# Patient Record
Sex: Female | Born: 1985
Health system: Southern US, Community
[De-identification: ages and names within clinical notes are randomized; demographics above are authoritative.]

## PROBLEM LIST (undated history)

## (undated) DIAGNOSIS — Q600 Renal agenesis, unilateral: Secondary | ICD-10-CM

## (undated) DIAGNOSIS — A6000 Herpesviral infection of urogenital system, unspecified: Secondary | ICD-10-CM

## (undated) DIAGNOSIS — IMO0002 Reserved for concepts with insufficient information to code with codable children: Secondary | ICD-10-CM

## (undated) HISTORY — PX: ECTOPIC PREGNANCY SURGERY: SHX613

## (undated) HISTORY — PX: DILATION AND EVACUATION: SHX1459

---
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
## 2006-05-01 ENCOUNTER — Inpatient Hospital Stay (HOSPITAL_COMMUNITY): Admission: EM | Admit: 2006-05-01 | Discharge: 2006-05-14 | Payer: Self-pay | Admitting: Emergency Medicine

## 2006-05-01 ENCOUNTER — Ambulatory Visit: Payer: Self-pay | Admitting: Cardiology

## 2006-05-01 ENCOUNTER — Ambulatory Visit: Payer: Self-pay | Admitting: Internal Medicine

## 2006-05-01 ENCOUNTER — Ambulatory Visit: Payer: Self-pay | Admitting: Pulmonary Disease

## 2006-05-02 DIAGNOSIS — Z862 Personal history of diseases of the blood and blood-forming organs and certain disorders involving the immune mechanism: Secondary | ICD-10-CM | POA: Insufficient documentation

## 2006-05-07 ENCOUNTER — Encounter: Payer: Self-pay | Admitting: Cardiology

## 2006-05-08 ENCOUNTER — Ambulatory Visit: Payer: Self-pay | Admitting: Infectious Diseases

## 2006-05-09 ENCOUNTER — Encounter: Payer: Self-pay | Admitting: Vascular Surgery

## 2006-05-09 ENCOUNTER — Ambulatory Visit: Payer: Self-pay | Admitting: Vascular Surgery

## 2006-05-15 DIAGNOSIS — N12 Tubulo-interstitial nephritis, not specified as acute or chronic: Secondary | ICD-10-CM | POA: Insufficient documentation

## 2006-05-15 DIAGNOSIS — A4151 Sepsis due to Escherichia coli [E. coli]: Secondary | ICD-10-CM | POA: Insufficient documentation

## 2006-05-27 ENCOUNTER — Encounter (INDEPENDENT_AMBULATORY_CARE_PROVIDER_SITE_OTHER): Payer: Self-pay | Admitting: *Deleted

## 2006-05-28 ENCOUNTER — Ambulatory Visit: Payer: Self-pay | Admitting: Internal Medicine

## 2006-05-28 ENCOUNTER — Encounter (INDEPENDENT_AMBULATORY_CARE_PROVIDER_SITE_OTHER): Payer: Self-pay | Admitting: *Deleted

## 2006-05-28 DIAGNOSIS — Q602 Renal agenesis, unspecified: Secondary | ICD-10-CM | POA: Insufficient documentation

## 2006-05-28 DIAGNOSIS — Q605 Renal hypoplasia, unspecified: Secondary | ICD-10-CM

## 2006-05-28 LAB — CONVERTED CEMR LAB
Eosinophils Relative: 3 % (ref 0–5)
Hemoglobin: 10.8 g/dL — ABNORMAL LOW (ref 12.0–15.0)
MCV: 92.8 fL (ref 78.0–100.0)
Neutro Abs: 2.7 10*3/uL (ref 1.7–7.7)
Platelets: 456 10*3/uL — ABNORMAL HIGH (ref 150–400)
RBC: 3.6 M/uL — ABNORMAL LOW (ref 3.87–5.11)
RDW: 14.5 % — ABNORMAL HIGH (ref 11.5–14.0)
WBC: 6.1 10*3/uL (ref 4.0–10.5)

## 2006-06-02 ENCOUNTER — Encounter (INDEPENDENT_AMBULATORY_CARE_PROVIDER_SITE_OTHER): Payer: Self-pay | Admitting: *Deleted

## 2006-10-31 ENCOUNTER — Encounter: Payer: Self-pay | Admitting: Internal Medicine

## 2007-01-15 ENCOUNTER — Other Ambulatory Visit: Admission: RE | Admit: 2007-01-15 | Discharge: 2007-01-15 | Payer: Self-pay | Admitting: Obstetrics and Gynecology

## 2007-07-14 ENCOUNTER — Inpatient Hospital Stay (HOSPITAL_COMMUNITY): Admission: EM | Admit: 2007-07-14 | Discharge: 2007-07-16 | Payer: Self-pay | Admitting: Emergency Medicine

## 2007-08-03 ENCOUNTER — Ambulatory Visit: Payer: Self-pay | Admitting: Internal Medicine

## 2007-08-04 ENCOUNTER — Encounter (INDEPENDENT_AMBULATORY_CARE_PROVIDER_SITE_OTHER): Payer: Self-pay | Admitting: Family Medicine

## 2008-02-26 ENCOUNTER — Inpatient Hospital Stay (HOSPITAL_COMMUNITY): Admission: AD | Admit: 2008-02-26 | Discharge: 2008-02-26 | Payer: Self-pay | Admitting: Obstetrics & Gynecology

## 2008-04-28 ENCOUNTER — Inpatient Hospital Stay (HOSPITAL_COMMUNITY): Admission: AD | Admit: 2008-04-28 | Discharge: 2008-04-28 | Payer: Self-pay | Admitting: Obstetrics & Gynecology

## 2008-06-23 ENCOUNTER — Ambulatory Visit (HOSPITAL_COMMUNITY): Admission: RE | Admit: 2008-06-23 | Discharge: 2008-06-23 | Payer: Self-pay | Admitting: Family Medicine

## 2008-08-21 ENCOUNTER — Encounter (HOSPITAL_COMMUNITY): Payer: Self-pay | Admitting: Obstetrics and Gynecology

## 2008-08-21 ENCOUNTER — Ambulatory Visit: Payer: Self-pay | Admitting: Obstetrics and Gynecology

## 2008-08-21 ENCOUNTER — Inpatient Hospital Stay (HOSPITAL_COMMUNITY): Admission: AD | Admit: 2008-08-21 | Discharge: 2008-08-21 | Payer: Self-pay | Admitting: Obstetrics & Gynecology

## 2008-08-21 ENCOUNTER — Inpatient Hospital Stay (HOSPITAL_COMMUNITY): Admission: AD | Admit: 2008-08-21 | Discharge: 2008-08-23 | Payer: Self-pay | Admitting: Obstetrics & Gynecology

## 2008-10-11 IMAGING — CR DG CHEST 1V PORT
1 series · 1 of 1 positions shown · non-contrast
Comparison: 05/02/06.

CLINICAL DATA: Renal failure and pulmonary edema. 
 PORTABLE CHEST ? 1 VIEW, 05/04/06, 4289 HOURS:

[view not recorded]
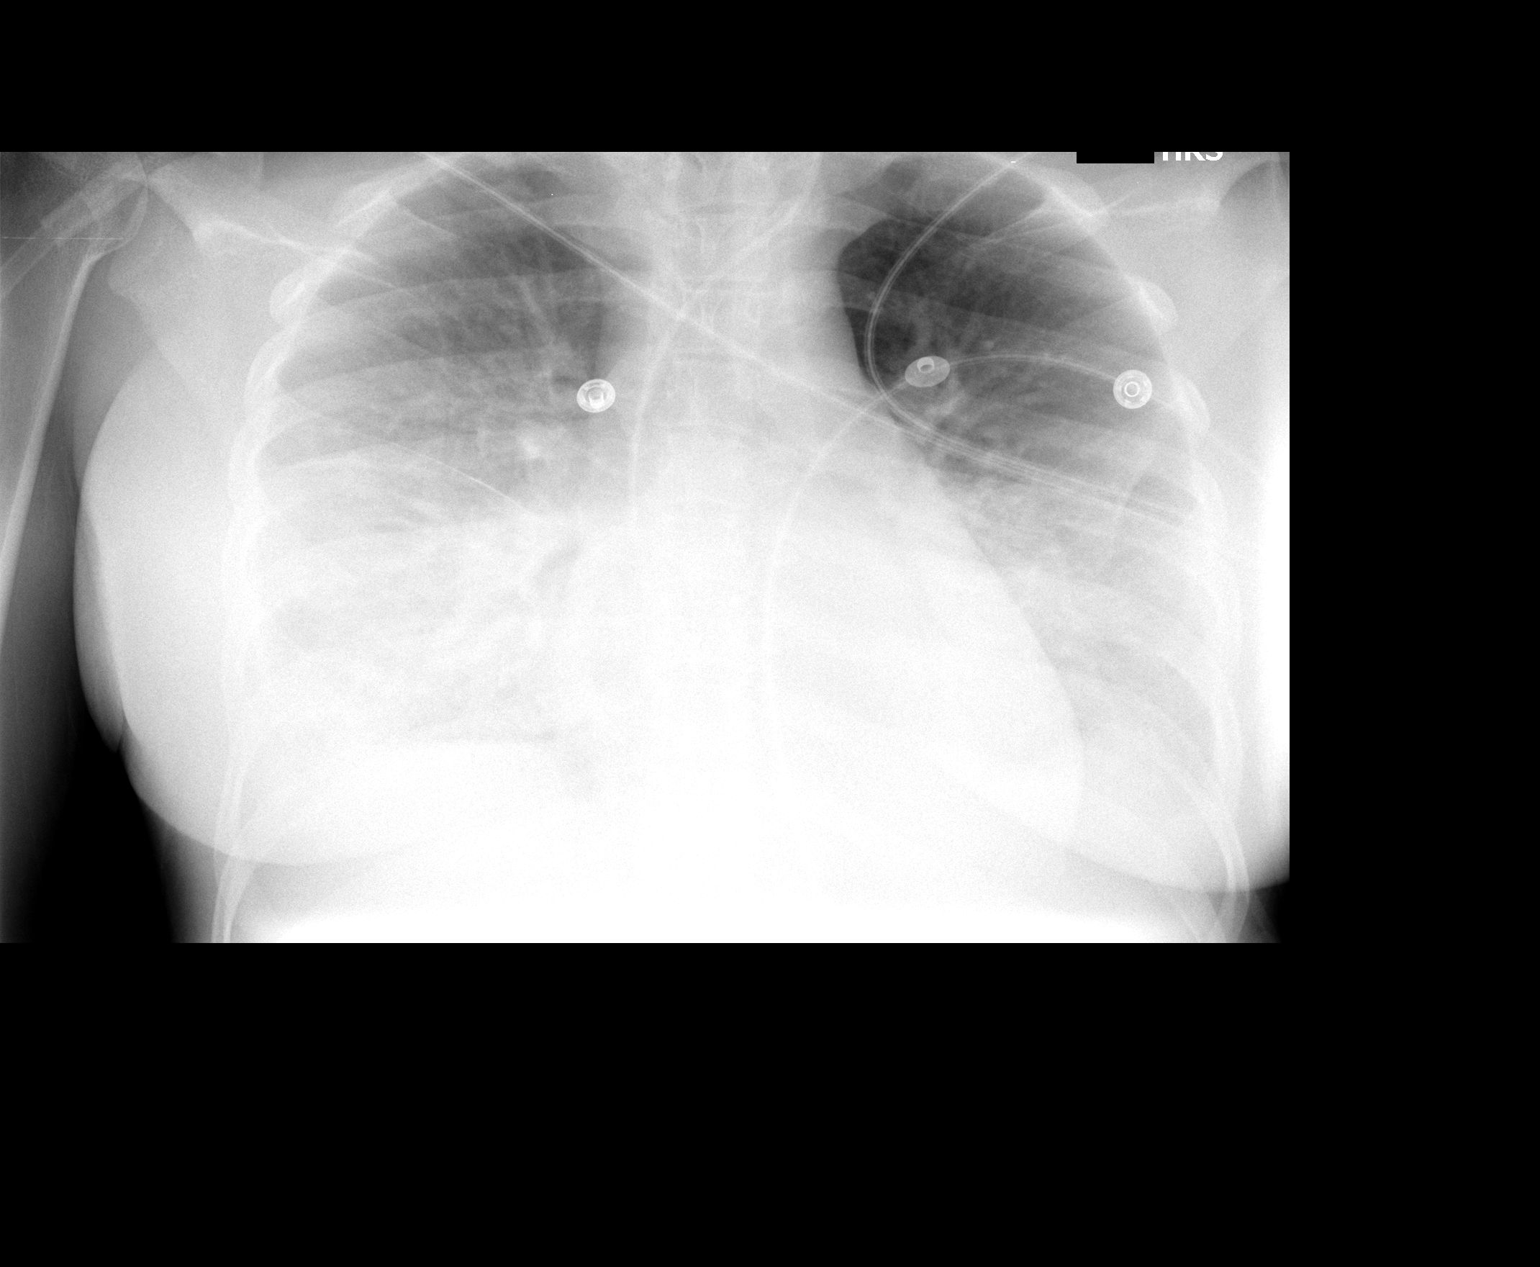

[1 of 1 positions shown; findings below may reference images not displayed]

FINDINGS: Bilateral lung volumes are lower when compared to the prior study.  Relatively stable edema pattern otherwise, which is slightly more prominent on the right.  Stable heart size.
IMPRESSION: Lower lung volumes.  Overall stable edema.

## 2008-10-12 IMAGING — US US RENAL PORT
1 series · 14 of 20 positions shown · non-contrast
Comparison: 05/02/06.

CLINICAL DATA: Renal failure.  
PORTABLE RENAL/URINARY TRACT ULTRASOUND:
TECHNIQUE: Complete ultrasound examination of the urinary tract was performed including evaluation of the kidneys, renal collecting systems, and urinary bladder.

[Series 1: unknown · 0.33mm/px · 14 of 20 slices shown]
[im 1/20]
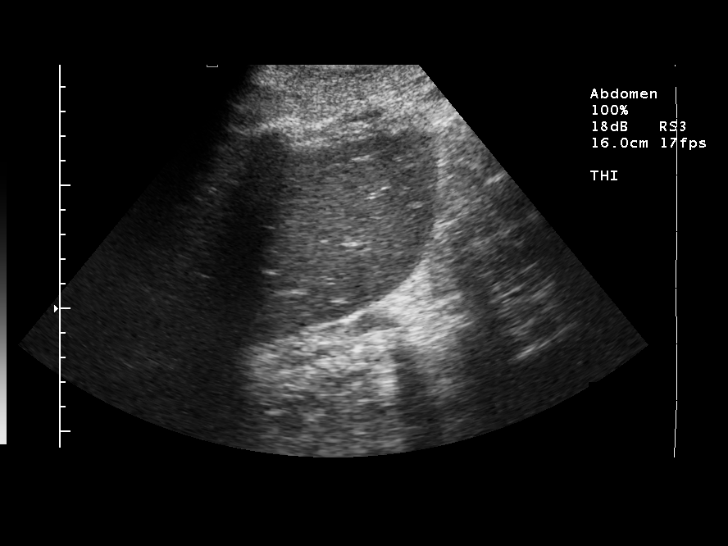
[im 3/20]
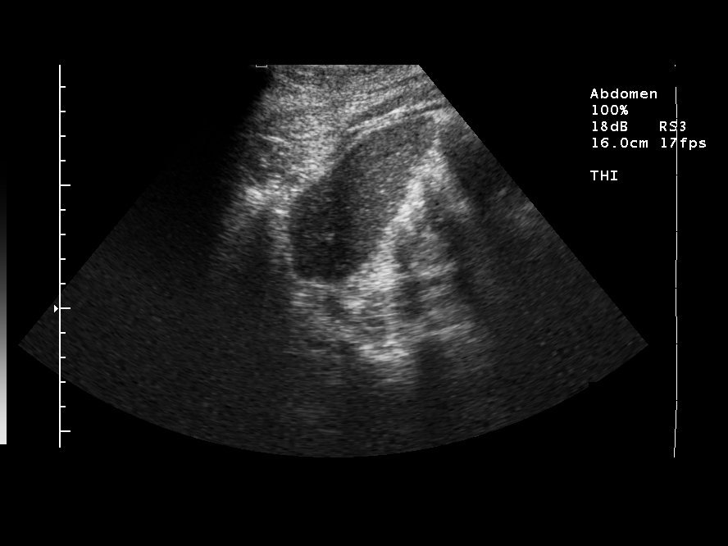
[im 4/20]
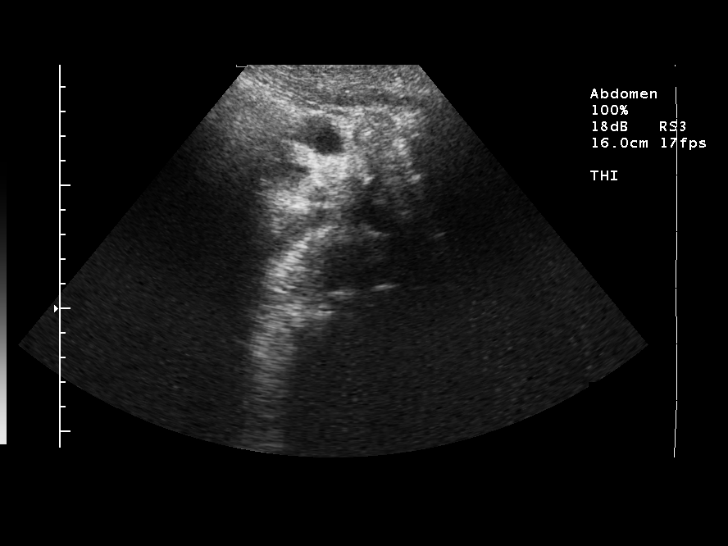
[im 6/20]
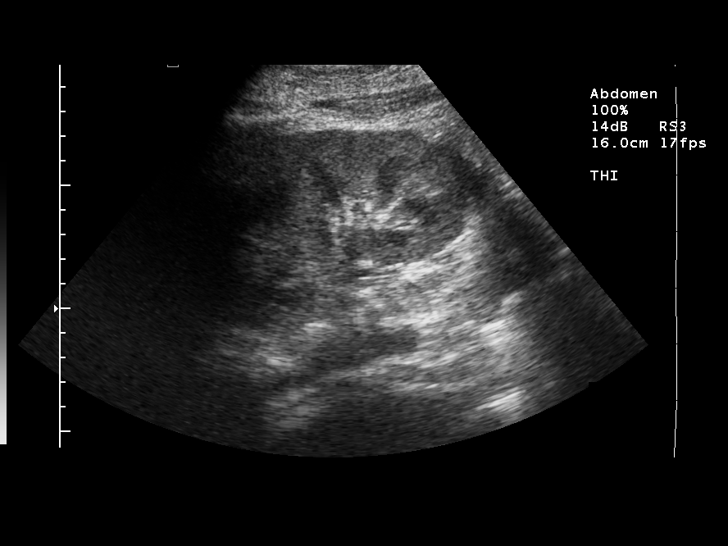
[im 7/20]
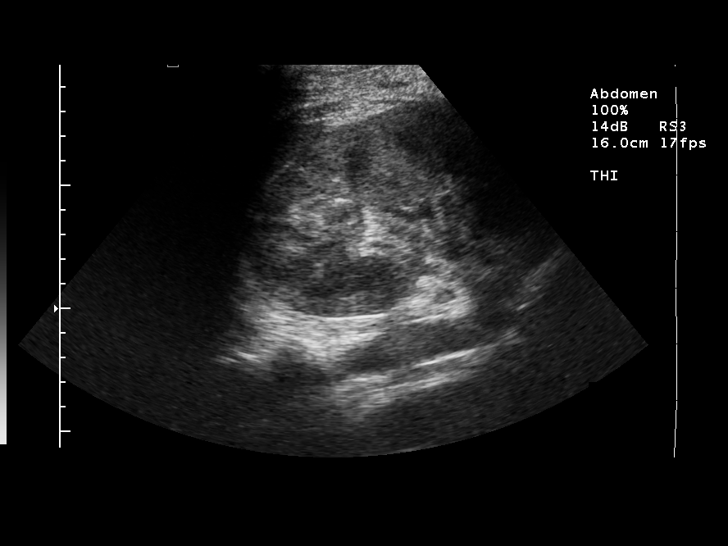
[im 8/20]
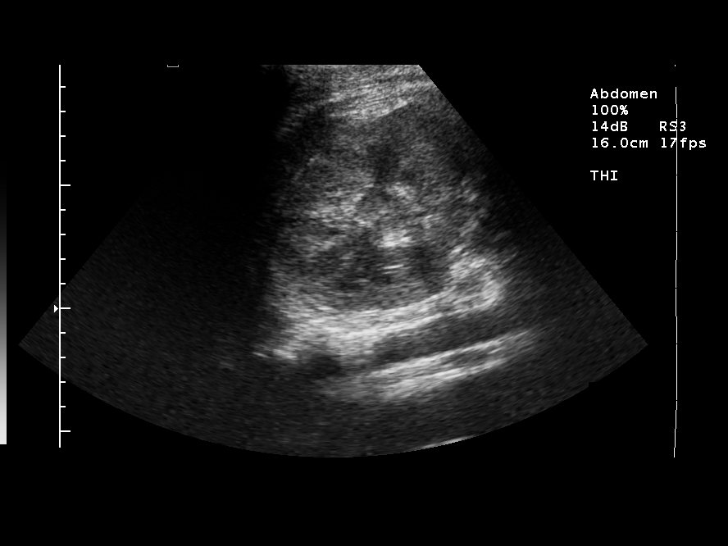
[im 10/20]
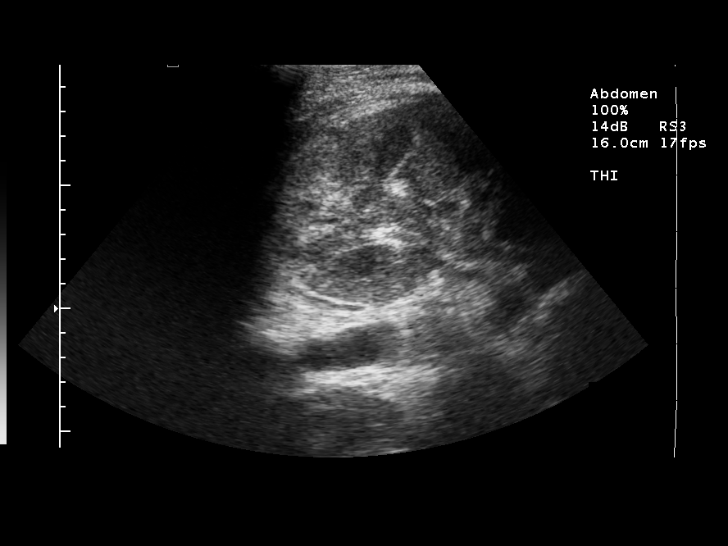
[im 11/20]
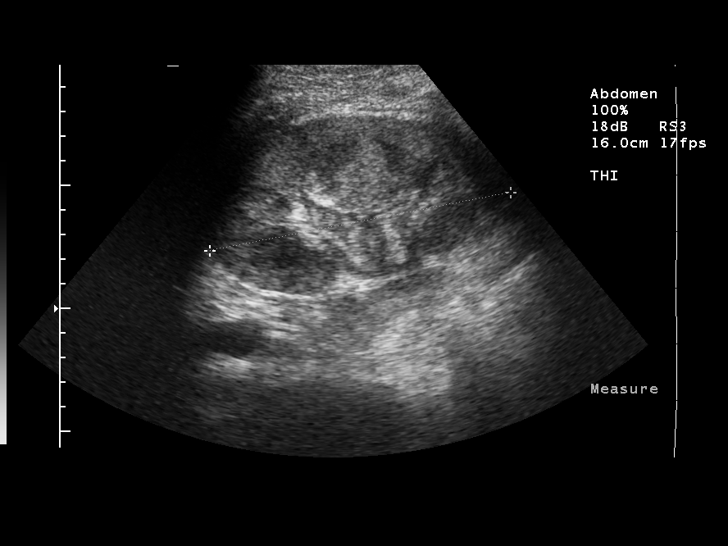
[im 13/20]
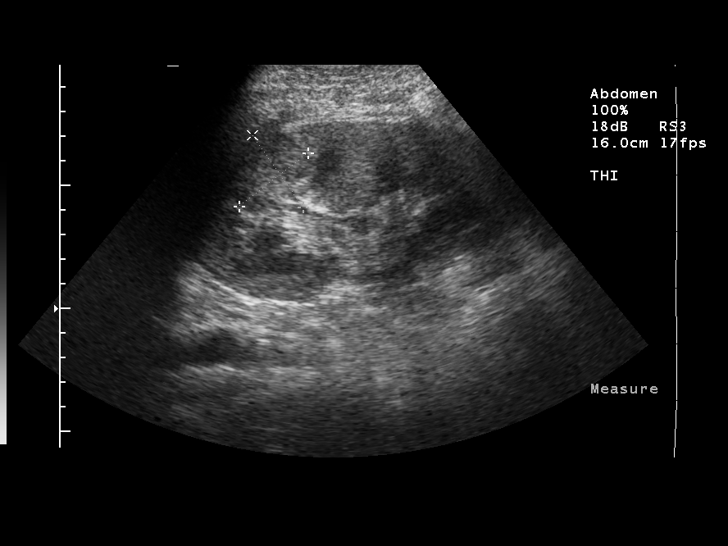
[im 14/20]
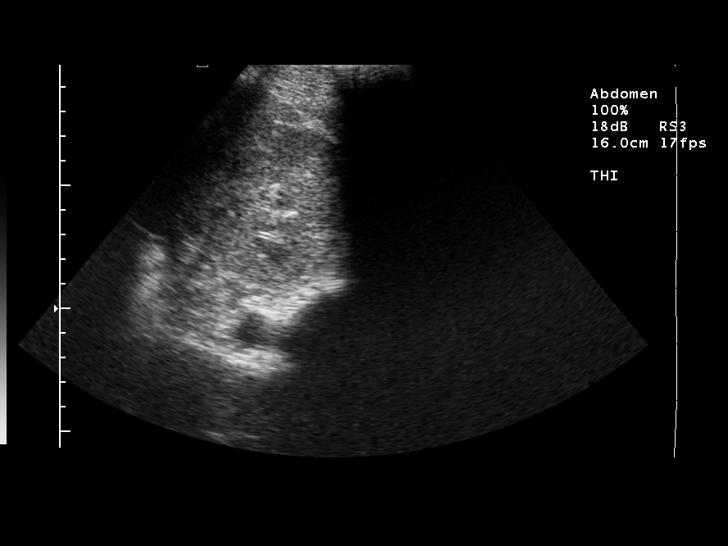
[im 16/20]
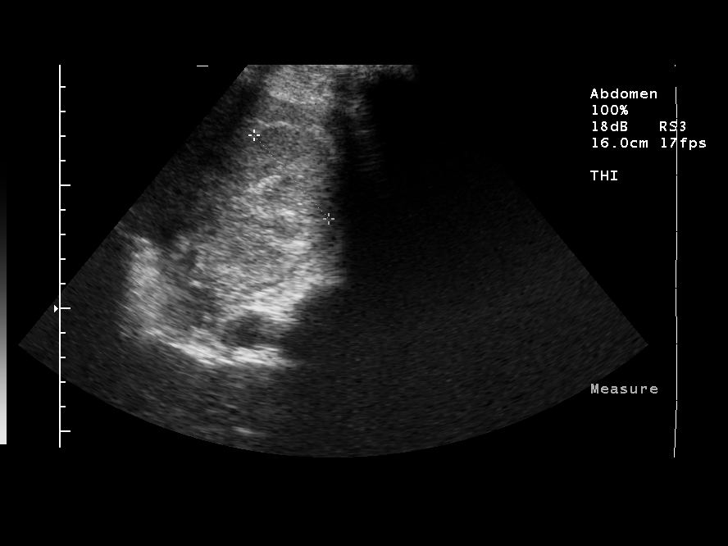
[im 17/20]
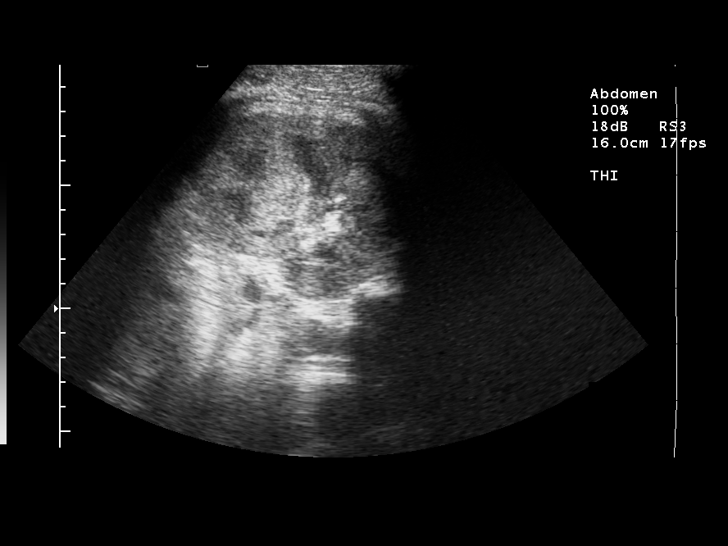
[im 18/20]
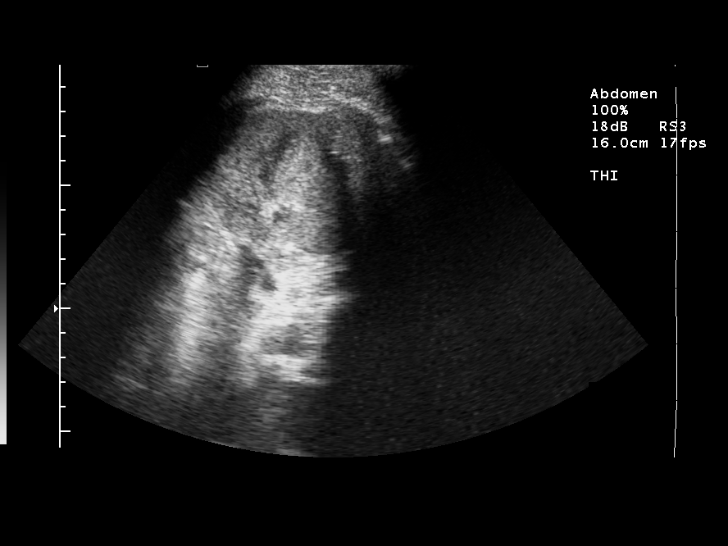
[im 20/20]
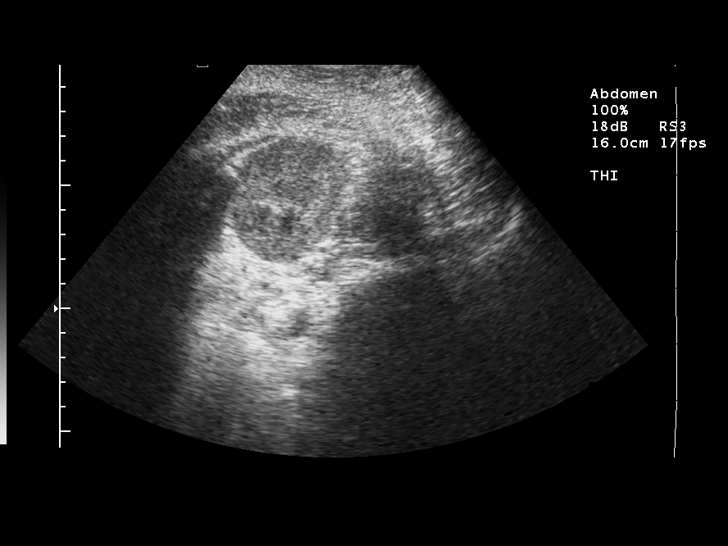

[14 of 20 positions shown; findings below may reference images not displayed]

FINDINGS: The patient has a solitary right kidney.  The left kidney measures 14.3 cm in length. The measurement on the prior exam was 14.5 cm.  No hydronephrosis.  Diffuse increase in renal cortical echogenicity with the medullary pyramids standing out as relatively sonolucent structures.  No hydronephrosis.
IMPRESSION: Findings compatible with a renal cortical based disease involving the solitary right kidney, for example diabetic nephropathy or a glomerulonephritis.  No hydronephrosis.

## 2008-10-12 IMAGING — CR DG CHEST 1V PORT
1 series · 1 of 1 positions shown · non-contrast
Comparison: 05/04/06 at [DATE] a.m.

CLINICAL DATA: Followup renal insufficiency/pulmonary edema. 
 PORTABLE CHEST ([DATE] A.M.):

[view not recorded]
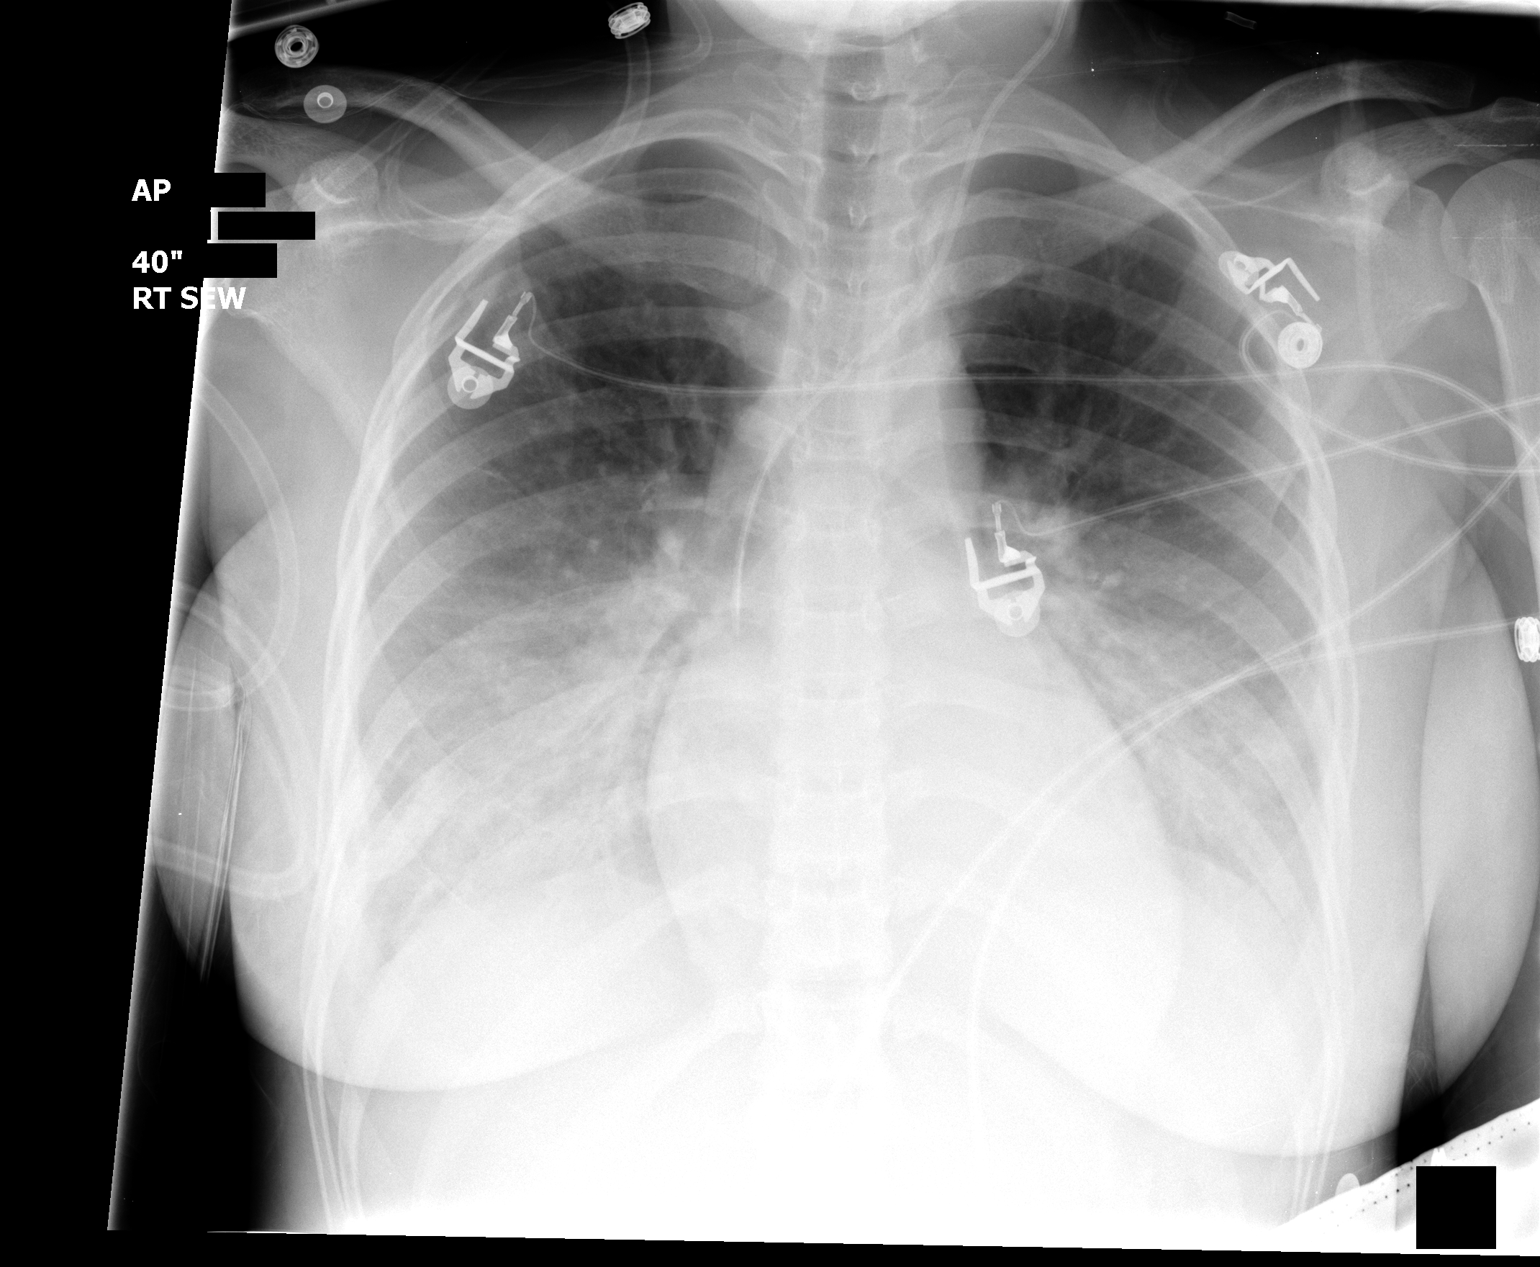

[1 of 1 positions shown; findings below may reference images not displayed]

FINDINGS: Left central line tip distal superior vena cava.  No pneumothorax.  Pulmonary edema.  Basilar infiltrate superimposed upon such cannot be excluded in the appropriate clinical setting.  Heart size top normal.  Small pleural effusions may be present.
IMPRESSION: No significant change in what may represent pulmonary edema although basilar infiltrates cannot be excluded in the appropriate clinical setting.

## 2009-10-25 ENCOUNTER — Inpatient Hospital Stay (HOSPITAL_COMMUNITY): Admission: AD | Admit: 2009-10-25 | Discharge: 2009-10-25 | Payer: Self-pay | Admitting: Obstetrics & Gynecology

## 2010-05-11 LAB — GC/CHLAMYDIA PROBE AMP, GENITAL: GC Probe Amp, Genital: NEGATIVE

## 2010-05-11 LAB — URINALYSIS, ROUTINE W REFLEX MICROSCOPIC
Hgb urine dipstick: NEGATIVE
Nitrite: NEGATIVE
Protein, ur: NEGATIVE mg/dL
Specific Gravity, Urine: 1.025 (ref 1.005–1.030)
Urobilinogen, UA: 1 mg/dL (ref 0.0–1.0)
pH: 6 (ref 5.0–8.0)

## 2010-05-11 LAB — WET PREP, GENITAL
Clue Cells Wet Prep HPF POC: NONE SEEN
Trich, Wet Prep: NONE SEEN
Yeast Wet Prep HPF POC: NONE SEEN

## 2010-05-11 LAB — POCT PREGNANCY, URINE: Preg Test, Ur: NEGATIVE

## 2010-06-04 LAB — CBC
HCT: 29.8 % — ABNORMAL LOW (ref 36.0–46.0)
MCHC: 34.3 g/dL (ref 30.0–36.0)
MCHC: 34.3 g/dL (ref 30.0–36.0)
MCHC: 34.5 g/dL (ref 30.0–36.0)
MCV: 95.4 fL (ref 78.0–100.0)
MCV: 95.4 fL (ref 78.0–100.0)
MCV: 96.5 fL (ref 78.0–100.0)
Platelets: 203 10*3/uL (ref 150–400)
Platelets: 251 10*3/uL (ref 150–400)
RBC: 3.13 MIL/uL — ABNORMAL LOW (ref 3.87–5.11)
RBC: 3.48 MIL/uL — ABNORMAL LOW (ref 3.87–5.11)
RDW: 13.9 % (ref 11.5–15.5)
WBC: 12.9 10*3/uL — ABNORMAL HIGH (ref 4.0–10.5)
WBC: 16.2 10*3/uL — ABNORMAL HIGH (ref 4.0–10.5)

## 2010-06-04 LAB — DIFFERENTIAL
Basophils Relative: 0 % (ref 0–1)
Eosinophils Absolute: 0 10*3/uL (ref 0.0–0.7)
Eosinophils Relative: 0 % (ref 0–5)
Lymphs Abs: 1 10*3/uL (ref 0.7–4.0)
Monocytes Absolute: 1.1 10*3/uL — ABNORMAL HIGH (ref 0.1–1.0)
Monocytes Relative: 7 % (ref 3–12)

## 2010-06-04 LAB — RPR: RPR Ser Ql: NONREACTIVE

## 2010-06-04 LAB — RAPID URINE DRUG SCREEN, HOSP PERFORMED
Barbiturates: NOT DETECTED
Benzodiazepines: NOT DETECTED

## 2010-06-07 LAB — URINALYSIS, ROUTINE W REFLEX MICROSCOPIC
Glucose, UA: NEGATIVE mg/dL
Ketones, ur: NEGATIVE mg/dL
Protein, ur: NEGATIVE mg/dL
Urobilinogen, UA: 0.2 mg/dL (ref 0.0–1.0)

## 2010-06-07 LAB — WET PREP, GENITAL

## 2010-06-11 LAB — URINALYSIS, ROUTINE W REFLEX MICROSCOPIC
Bilirubin Urine: NEGATIVE
Glucose, UA: NEGATIVE mg/dL
Nitrite: NEGATIVE
Specific Gravity, Urine: 1.015 (ref 1.005–1.030)
pH: 6.5 (ref 5.0–8.0)

## 2010-06-11 LAB — WET PREP, GENITAL
Trich, Wet Prep: NONE SEEN
Yeast Wet Prep HPF POC: NONE SEEN

## 2010-06-11 LAB — GC/CHLAMYDIA PROBE AMP, GENITAL: Chlamydia, DNA Probe: NEGATIVE

## 2010-07-10 NOTE — Discharge Summary (Signed)
NAMEENVI, EAGLESON         ACCOUNT NO.:  [id number]   MEDICAL RECORD NO.:  [id number]          PATIENT TYPE:  INP   LOCATION:  5151                         FACILITY:  [facility]   PHYSICIAN:  Marcellus Scott, MD     DATE OF BIRTH:  [birth year]   DATE OF ADMISSION:  07/14/2007  DATE OF DISCHARGE:  07/16/2007                               DISCHARGE SUMMARY   PRIMARY MEDICAL DOCTOR:  Gentry Fitz but will follow up at [facility].   DISCHARGE DIAGNOSES:  1. Escherichia coli acute left pyelonephritis.  2. Congenital solitary left kidney.  3. Leukocytosis - resolved.  4. Abuse - tobacco and substance.  5. Proteinuria.   DISCHARGE MEDICATIONS:  1. Cipro 500 mg p.o. b.i.d. for 5 days.  2. Tylenol 650 mg p.o. q. 4-6 hours p.r.n.   PROCEDURES:  Renal ultrasound.  A solitary left kidney, compensated  hypertrophy of the left kidney, otherwise normal left kidney.  No  bladder lesions.   PERTINENT LABS:  Urine culture E. coli sensitivity pending.  Basic  metabolic panel today is unremarkable with a BUN of 5, creatinine of  0.87.  CBC with hemoglobin 12.1, hematocrit 35.9, white blood cell 9,  platelets 217.  Urinalysis with too-numerous-to-count white blood cells  and many bacteria, 100 mg/dL of protein.  Urine pregnancy test was  negative.   CONSULTATIONS:  None.   HOSPITAL COURSE AND PATIENT DISPOSITION:  Please refer to the history  and physical note for initial admission details.  In summary, Ms.  Murakami a pleasant 25 year old [place] American female patient with a  history of solitary kidney, prior episode of pyelonephritis in that  kidney who presented with dysuria and left flank pain of approximately  10 days' duration.  She also gave a history of chills but no measurable  fever.  She is sexually active with multiple partners and uses  protection most times but not always.  Further evaluation in the  emergency room revealed a temperature of 98.5 degrees Fahrenheit,  leukocytosis of 13.4,  left costovertebral angle tenderness, and a  urinalysis suggestive of pyelonephritis.  The patient was thereby  admitted to the medical floor for management.  1. Escherichia coli acute left pyelonephritis.  Urine cultures were      sent and she was placed empirically on IV Cipro. The patient has      done quite well, with resolution of left flank pain within the      first 24 hours.  She has been afebrile.  Her leukocytosis has      resolved.  Abdomen is benign and there is no costovertebral angle      tenderness.  She has completed 48 hours of IV antibiotics.  Her      sensitivity is still pending.  Will discharge the patient on oral      ciprofloxacin to complete a total 7-day course of antibiotics.  The      patient and her mother have been advised to contact me tomorrow for      the results of the sensitivity pattern, if the antibiotics require      changing.  The patient has also  been advised to seek immediate      medical attention if there is any further deterioration in her      condition.  She is to follow up with [facility] and has an      appointment made for her on Jul 22, 2007 at 9:30 a.m.  2. A solitary left kidney.  A renal ultrasound was done to ensure      there were no complications and it does appear to be an      uncomplicated pyelonephritis.  3. Leukocytosis secondary to problem #1 - resolved.  4. Abuse of tobacco and marijuana.  The patient has been counseled.   Addendum: Urine culture: pan sensitive E Coli.      Marcellus Scott, MD  Electronically Signed     AH/MEDQ  D:  07/16/2007  T:  07/16/2007  Job:  [id number]   cc:   Dala Dock

## 2010-07-10 NOTE — H&P (Signed)
Connie Wells, KLINGEL NO.:  [id number]   MEDICAL RECORD NO.:  [id number]          PATIENT TYPE:  INP   LOCATION:  1827                         FACILITY:  [facility]   PHYSICIAN:  Altha Harm, MDDATE OF BIRTH:  [birth year]   DATE OF ADMISSION:  07/14/2007  DATE OF DISCHARGE:                              HISTORY & PHYSICAL   This is a 25 year old female with history of a solitary kidney who  presents to the emergency room with symptoms of dysuria and left flank  pain.  The patient states that dysuria started approximately 1-1/2 weeks  ago.  However, today she was having pain in the left flank area and  unable to stand.  The patient has been having chills at home but no  measurable fever.  Here in the emergency room, there is no fever  measured.  The patient has been taking Tylenol and Motrin to abate her  pain which has helped up until today.  The patient has had no sick  contacts.  She is sexually active with multiple partners.  She denies  any vaginal discharge or dyspareunia.  Please note that the patient was  hospitalized approximately 1 year ago with E. coli sepsis requiring ICU  admission.   PAST MEDICAL HISTORY:  Significant only for the solitary kidney and the  E. coli sepsis approximately a year ago.   FAMILY HISTORY:  Unremarkable.   SOCIAL HISTORY:  The patient lives at home with her family including her  mother and siblings.  She smokes approximately 1-2 cigarettes per day.  She uses alcohol occasionally, and she uses marijuana recreationally.  She is sexually active with mainly one partner but occasionally has  other partners. She has had unprotected sex on occasion; however, her  usual practice is to use condoms.   ALLERGIES:  No known drug allergies.   CURRENT MEDICATIONS:  None.   PAST MEDICAL HISTORY:  None.   REVIEW OF SYSTEMS:  Fourteen systems were reviewed.  All systems were  negative except as noted in the HPI.   In the emergency  room, studies showed the following.  White blood cell  count 13.4, hemoglobin 14.6, hematocrit 43, platelet count 267.  Sodium  140, potassium 4.3, chloride 104, bicarb 96, BUN 8, creatinine 1.2.  Urinalysis positive for elements consistent with urinary tract  infection.   PHYSICAL EXAMINATION:  GENERAL:  The patient is resting comfortably in  no acute distress.  VITAL SIGNS:  Temperature 98.5, blood pressure 116/78, heart rate 81,  respiratory rate 18.  HEENT:  Normocephalic and atraumatic.  Pupils equal, round, and reactive  to light and accommodations.  Extraocular movements intact.  Tympanic  membranes translucent bilaterally with good landmarks.  Oropharynx is  moist.  No exudates, erythema, or lesions noted.  RESPIRATORY:  Normal respiratory effort. Equal excursion bilaterally.  No wheeze, rales, rhonchi noted on examination.  CARDIOVASCULAR:  Normal S1 and S2.  No murmurs, rubs, or gallops noted.  PMI nondisplaced.  ABDOMEN:  Normoactive bowel sounds, soft, nontender, nondistended.  No  masses or hepatosplenomegaly.  The patient has CVA tenderness.  NEUROLOGIC:  No focal neurologic deficits.  Cranial nerves II-XII  grossly intact.  MUSCULOSKELETAL:  No warmth, swelling, or erythema around the joints.  No spinal tenderness noted.  PSYCHIATRIC:  Alert and oriented x3.  She has good insight and  cognition, recent and remote recall.   ASSESSMENT AND PLAN:  The patient presents with pyelonephritis.  Although the patient is not toxic appearing, she does have findings of  costovertebral angle tenderness.  In light of a solitary kidney, the  patient will be admitted for at least 24 hours of IV antibiotics and  then switched to oral.  Urine will be sent for culture and sensitivity,  and further therapy would depend on initial response to testing and  therapy.      Altha Harm, MD  Electronically Signed     MAM/MEDQ  D:  07/14/2007  T:  07/14/2007  Job:  [id number]

## 2010-07-13 NOTE — Consult Note (Signed)
NAMEANALA, WHISENANT         ACCOUNT NO.:  [id number]   MEDICAL RECORD NO.:  [id number]          PATIENT TYPE:  INP   LOCATION:  2316                         FACILITY:  [facility]   PHYSICIAN:  Lindaann Slough, M.D.  DATE OF BIRTH:  12-23-1985   DATE OF CONSULTATION:  DATE OF DISCHARGE:                                 CONSULTATION   DATE OF CONSULTATION:  May 02, 2006.   INDICATION FOR CONSULTATION:  1. Left flank pain.  2. Possible kidney stone.  3. Sepsis.   HISTORY OF PRESENT ILLNESS:  The patient is a 25 year old female who  started complaining of left back and left lower quadrant pain 2 days  ago.  The pain has been moderate in nature and constant and associated  with gross hematuria, nausea and vomiting and a fever of 103.  She then  went to the emergency room yesterday and a renal ultrasound showed a  solitary left kidney without hydronephrosis.  Creatinine was 2.51.  CT  scan showed a solitary left kidney with possible left distal urinary  wall stone with inflammatory changes along the left kidney and the left  ureter with mild fullness of the collecting system in the ureter.  There  is a questionable 3-mm left UVJ calculus.  Initially the patient did not  have any urinary output.  A Foley catheter was then inserted in the  bladder and then eventually she started passing urine.  The original  plan was to take her to the OR last night for insertion of a double-J  catheter but when she started passing urine it was felt not indicated to  take her to the OR on an emergent basis.  She was then admitted.  Her  temperature this morning was up to 101.9 and her blood pressure went  down to 81/40 and fluctuates to 97/61 with a pulse rate between 110 and  140 and respirations between 20 and 30.  At this time she is alert and  oriented and she is in no acute distress.  The Foley catheter is  draining blood tinge urine.   PAST MEDICAL HISTORY:  Positive for chlamydia, herpes.   PAST SURGICAL HISTORY:  None.   ALLERGIES:  No known drug allergies.   MEDICATIONS:  None.   FAMILY HISTORY:  Positive for hypertension and diabetes.   SOCIAL HISTORY:  She has  smoked for about a year and she drinks  occasionally.   REVIEW OF SYSTEMS:  Positive for nausea, vomiting, history of gross  hematuria, left flank and left lower quadrant pain and everything else  is negative.   PHYSICAL EXAMINATION:  GENERAL:  On physical examination, this is a well-  developed 25 year old female with is complaining of moderate left flank  and left lower quadrant pain.  VITAL SIGNS:  Blood pressure is 97/61.  Pulse 110.  Respirations 20.  HEENT:  Her head is normal.  She has pink conjunctivae.  She has a  central line on the left side of the neck and there is no cervical  adenopathy and no thyromegaly.  LUNGS:  Clear.  HEART:  Regular rhythm.  ABDOMEN:  Soft.  Nondistended.  She has mild left CVA tenderness and  mild tenderness in the left flank and the left lower quadrant.  She has  no hepatomegaly.  No splenomegaly.  Bladder is not distended.  She has a  Foley catheter that is draining blood tinged urine.   I did not do a pelvic examination.   LABORATORY DATA:  Hemoglobin this morning is 10.5.  hematocrit 30.  WBCs  16.9 with 97% neutrophils.  BUN is 17 this morning with creatinine down  to 2.1.  Urinalysis shows more than 300 mg of protein, 18 mg of ketone,  2 mg of urobilinogen, nitrite positive, many bacteria, too numerous to  count WBCs and RBCs.  Urine culture is pending.  Blood culture shows  gram-negative and gram-positive rods.   IMPRESSION:  1. Urosepsis.  2. Possible left ureteral stone.  3. Solitary left kidney.   SUGGESTION:  Continue IV antibiotics.  Repeat renal ultrasound, and if  there is evidence of hydronephrosis she would need either a double-J  catheter or percutaneous nephrostomy.   We will follow the patient with you.      Lindaann Slough, M.D.   Electronically Signed     MN/MEDQ  D:  05/02/2006  T:  05/02/2006  Job:  [id number]

## 2010-07-13 NOTE — Consult Note (Signed)
NAMESELEEN, Connie Wells         ACCOUNT NO.:  [id number]   MEDICAL RECORD NO.:  [id number]          PATIENT TYPE:  INP   LOCATION:  1829                         FACILITY:  [facility]   PHYSICIAN:  Maisie Fus A. Cornett, M.D.DATE OF BIRTH:  [birth year]   DATE OF CONSULTATION:  DATE OF DISCHARGE:                                 CONSULTATION   REQUESTING PHYSICIAN:  Dr. Josetta Huddle.   REASON FOR CONSULTATION:  Abdominal pain, hematuria.   HISTORY OF PRESENT ILLNESS:  The patient is a 25 year old female with a  2-day history of left back and left lower quadrant left suprapubic pain.  It has been constant in nature.  The pain right now is currently gone,  she states.  Denies any history of vaginal discharge but apparently has  a history of chlamydia in the past.  She was noted to have significant  hematuria grossly and also fever to 103.  Currently, she is denying any  abdominal pain.  Denies any dysuria or polyuria.  Her pain was mild to  moderate, mostly in her back and left flank.  She also has some  suprapubic left lower quadrant pain, but according to her is gone now.  It has been associated nausea and vomiting.  The pain was moderate  intensity with no radiation.  Unclear if anything made it better or  worse.   PAST MEDICAL HISTORY:  1. History of chlamydia.  2. History of herpes.   PAST SURGICAL HISTORY:  None.   ALLERGIES:  NONE.   MEDICATIONS:  None.   FAMILY HISTORY:  Hypertension and diabetes.   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  Her mother accompanies her.   REVIEW OF SYSTEMS:  A 15-point review of systems negative, otherwise as  stated as above.   PHYSICAL EXAM:  VITAL SIGNS:  Temperature 103, heart rate 120, blood  pressure 129/73.  GENERAL APPEARANCE:  Pleasant female in mild distress.  HEENT:  No scleral icterus.  Oropharynx is moist.  NECK:  Supple, nontender, no mass.  Trachea midline.  CHEST:  Clear to auscultation.  Chest wall motion normal.  CARDIOVASCULAR:  Tachycardia without rub, murmur or gallop.  EXTREMITIES:  Peripheral pulses are present.  Feet are warm.  ABDOMEN:  Soft, nondistended.  No rebound or guarding.  Specifically, no  suprapubic or left lower quadrant tenderness.  No mass.  GU:  I did not  repeat her pelvic exam.  Cyst was negative apparently by the EMDs  report.  EXTREMITIES:  Muscle tone normal range of motion of normal.  NEUROLOGIC:  Examination grossly intact with motor and sensory function  and cranial nerve function intact.   DIAGNOSTIC STUDIES:  Ultrasound shows a solitary left kidney.  No  evidence of hydronephrosis.  She is a white count of 9,000 with a left  shift.  Her  hemoglobin and platelets are normal.  Urinalysis shows  blood and too numerous to count white cells.  There is also evidence of  leukocyte esterase positivity as well.   IMPRESSION:  Flank pain, high fever and obvious signs of urinary tract  issue, with a elevated creatinine 2.5.  PLAN:  I have recommended CT scanning to further evaluate her abdominal  contents without IV contrast.  Recommend consultation to possible GYN,  as she has a  history of the abdominal pain.  She does have significant  findings of urological issues here.  Her examination does not support  any intra-abdominal process from the standpoint of general surgery.  We  will follow along with her this point in time but feels IV antibiotics  and fluid are necessary.  Thank you this consultation.      Thomas A. Cornett, M.D.  Electronically Signed     TAC/MEDQ  D:  05/01/2006  T:  05/01/2006  Job:  [id number]

## 2010-07-13 NOTE — Discharge Summary (Signed)
Connie Wells, Connie Wells         ACCOUNT NO.:  [id number]   MEDICAL RECORD NO.:  [id number]          PATIENT TYPE:  INP   LOCATION:  5022                         FACILITY:  [facility]   PHYSICIAN:  Thereasa Solo, M.D.  DATE OF BIRTH:  Jun 30, 1985   DATE OF ADMISSION:  05/01/2006  DATE OF DISCHARGE:  05/14/2006                         DISCHARGE SUMMARY - REFERRING   DISCHARGE DIAGNOSES:  1. Complicated acute episode of pyelonephritis which lead to E. coli      sepsis and septic shock requiring use of Xigris for systemic      inflammatory response syndrome.  2. Unilateral kidney.  One of the patient's kidneys is not present on      ultrasound on her right side, only her left one is present.  3. Question of nephrolithiasis during this admission.  4. Chlamydia diagnosis, history of in October 1997, as well as      Gonorrhea.  5. Herpes simplex virus II diagnosed in 2005.  6. Tobacco abuse.   DISCHARGE MEDICATIONS:  1. Cipro 500 mg b.i.d. x3 weeks.  2. Oxycodone 5 mg one pill q.6h. p.r.n. pain.  Dispensed 20.   DISPOSITION AND FOLLOW-UP:  Patient is to follow up in our outpatient  clinic who will call the patient with an appointment within one week.  At time of follow-up visit, please check if the patient has been able to  obtain her medications and she is not having any more symptoms of  pyelonephritis.  May wish to verify this by checking her CBC and white  count as well as consider checking a UA.  Both of these may still show  signs of pyelonephritis, although if the patient continues to take her  Cipro which the E. coli is sensitive to, she should eventually recover.   PROCEDURES PERFORMED:  1. Chest x-ray May 01, 2006, showed peribronchial thickening, mild      vascular congestion and mild basilar atelectasis.  2. CT of abdomen and pelvis May 01, 2006, showed inflammation along      the solitary left kidney with left ureter with mild intrarenal      collecting system,  ureteral fullness and equivocal 3 mm left UVJ      calculus, question of infection and/or obstruction.  3. Renal ultrasound May 01, 2006, showed right kidney is not      visualized and may be removed or hypoplastic.  The left kidney is      14 cm and shows mild increase in echogenicity which could be due to      medical renal disease such as glomerulonephritis, no obstruction.  4. Renal ultrasound May 05, 2006, showed findings compatible with a      renal cortical base disease involving the solitary right kidney,      for example, diabetic nephropathy or glomerulonephritis.  No      hydronephrosis.  5. A 2-D echocardiogram  May 07, 2006, showed overall left      ventricular systolic function was normal.  Left ventricular      ejection fraction was estimated range being 55-60%.  There were no      left ventricular regional  wall motion abnormalities.  Disseminated      peak pulmonary artery systolic pressure was mildly increased.      There was moderate tricuspid valvular regurgitation.  6. CT of abdomen and pelvis May 08, 2006, showed a persistent      enlarged edematous appearing left kidney with possible mild      hydronephrosis.  Cannot exclude pyelonephritis.  No obvious abscess      or obstruction. This is a solitary kidney.  Small bilateral pleural      effusions and bibasilar atelectasis.  Mildly dilated bowel but no      obstruction.  Diffuse small mesenteric and retroperitoneal nodes      which are stable.  Stable CT appearance of the pelvis.  The uterus      and ovaries are grossly normal.  There are no pelvic      calcifications.  Cannot rule out the possibility of left UVJ      calculus but no change since prior study.  7. Renal ultrasound May 08, 2006, no interval change in the      appearance of the patient with solitary left kidney.  No abscess      identified.  Gallbladder sludge.  8. Lower extremity Doppler on May 09, 2006, showed no evidence of      DVT,  superficial thrombus or Baker's cyst noted bilaterally.  9. Chest x-ray May 12, 2006, left lower lobe now clear, no active      disease.   CONSULTATIONS:  1. Lacretia Leigh. Ninetta Lights, M.D., infectious disease.  2. Lindaann Slough, M.D., urology.  3. Clovis Pu. Cornett, M.D., surgery.   BRIEF ADMISSION HISTORY AND PHYSICAL:  This is a 25 year old woman who  presents to [facility] Department complaining of back pain  that is lumbar in nature with left greater than right for approximately  two-day duration.  Patient was rated approximately 7-8/10 initially and  was exacerbated by urination and straining but improved with Aleve and  warm baths.  On the day of admission, the patient awoke to a pain that  was rated as 10/10 which radiated to the left inguinal region.  Over the  past two days, the patient has also been noticing increasing amounts of  hematuria.  At first, two days prior to arrival she just noticed a pink  shade upon wiping post voiding; however, this progressed to frank blood  and decreased urine output.   There was no previous episodes of similar symptoms, no chest pain or  shortness of breath.  The patient does report fevers subjective at home.   ALLERGIES:  NO KNOWN DRUG ALLERGIES.   HOME MEDICATIONS:  None.  The patient does take Depo-Provera injection  every three months, the patient is due for this injection.   PHYSICAL EXAMINATION:  GENERAL APPEARANCE:  No acute distress.  Patient  is calm.  Pain with movement and sitting forward.  VITAL SIGNS:  Temperature 98.2, blood pressure 138/88, pulse 95,  respiratory rate 18, sating 100% on room air.  HEENT:  Eyes: Extraocular muscles intact.  Pupils are equal, round,  reactive to light and accommodation.  ENT:  Oropharynx is clear.  NECK:  Without thyromegaly.  RESPIRATORY:  Clear to auscultation bilaterally with good breath sounds.  No wheezing, rhonchi or rales. CARDIOVASCULAR:  Regular rate and rhythm without  murmur.  No JVD or  carotid bruits.  ABDOMEN:  Slightly tense, question secondary to guarding.  There is mild  tenderness to palpation on  the left greater than right.  The patient did  not appear to have an acute abdomen at this time.  Positive bowel  sounds.  EXTREMITIES:  No edema.  GU:  There is left CVA tenderness to palpation greater than the right.  PELVIC:  Normal per ED physician.  Gonorrhea and Chlamydia were sent at  that time.  SKIN:  Without rash.  NEUROLOGIC:  Completely intact.  No areas of focal deficit.  PSYCHIATRIC:  Affect is appropriate.   LABORATORY DATA:  Initial lab shows sodium 138, potassium 4, chloride  108, bicarb 21, BUN 17, creatinine 2.5, glucose 116.  White count 9,  hemoglobin 12.3, hematocrit 36.1, platelets 215, ANC 8.7, MCV 93.5.  Bilirubin 1.2, alkaline phosphatase 45, AST 27, ALT 15, protein 6.8,  albumin 3.5, calcium 8.4.  Urine pregnancy was negative.  Lipase 23.  UA  showed a red and turbid urine, specific gravity 1.038, negative glucose,  large bilirubin, ketones are 40, large blood, protein greater than 300  with nitrite positive and leukocyte esterase moderate with white blood  cells too numerous to count and rbc too numerous to count.   HOSPITAL COURSE BY PROBLEM:  PROBLEM #1 -  INITIALLY PRESENTING WITH  BACK PAIN, ASSOCIATED HEMATURIA FOUND TO BE PYELONEPHRITIS:  In this  patient, on initial renal ultrasound, the patient and I were surprised  to find that she did not have a right kidney.  Her pyelonephritis was  shown to affect the left kidney, although her initial renal ultrasound  did not show any hydronephrosis. There was also a question at the time  of admission that the patient may have had a kidney stone which may have  complicated her overall picture.  We also obtained an abdominal and  pelvic CT scan which confirmed our earlier suspicions of pyelonephritis.  We initially had these suspicions based on her physical examination with   acute tenderness in the left CVA as well as a urinalysis that was quite  remarkable for infection as well as blood.   The patient was initially started on Zosyn for the possibility of a tubo-  ovarian abscess at that time, which was later found to not be a likely  cause of her symptoms.  She was started on IV fluids to maintain  appropriate hydration and good urine output.   We did consult the kidney doctors here as well as general surgery for  assistance in her care.   Eventually the patient's urine was found to be growing E. coli from it  which was sensitive to Cipro.  She was changed over the Cipro IV and  later over two Cipro p.o. towards the end of her admission.  However, at  one point, the patient was noticed to be deteriorating on the days  following her immediate admission.  She became hypotensive, had fevers  greater than 103 and we were concerned that there was an abscess or other form of infection that we had not eliminated at that point.  We  did call critical care medicine who was consulted on this patient as  well.  They decided that we were doing appropriate control for this  patient.  They elected to place a central line in this patient to assist  with measuring patient's CVPs as her blood pressure was becoming low.  As her temperatures, increasing white count and hypotension continued,  decided to put this patient on a septic protocol.  She was transferred  to the ICU.  She was  placed on Xigris and it looked, at one point, as if  she may need pressors.   Eventually the patient's blood pressure recovered, normal ranges.  She  was taken off Xigris and eventually her central line was removed.  Also  her central line tip was cultured which did not show any growth.  After  the patient's blood pressures were back to her normal ranges, she did  clinically look quite well upon physical examination and history taking.  However, her laboratory findings were consistent with a  continued  infection in this patient.  Therefore, at this point, we decided it  would be in our best interest as well as the patient's best interest to  consult infectious disease to help with the care of this patient.  They  decided that the patient still had a bad case of pyelonephritis that the  E. coli was still sensitive to the ciprofloxacin, however, it would take  at least a few weeks for the patient to begin recovering.   At one point, when her fevers were so high, we did add ceftriaxone to  the ciprofloxacin to broaden her coverage.  However, this was decided  after one day that it was not beneficial in this patient to broaden her  coverage and it was discontinued.  Eventually the patient was completely  recovered from her pyelonephritis symptomatically.  She was tolerating  the Cipro p.o. well and we felt that on discharge she would be  adequately covered with three additional weeks of Cipro p.o.  She would  also be seen in our clinic within one week for immediate follow-up to  any persistent issues.   PROBLEM #2 -  COAGULATION:  The patient did have some elevated  coagulation/DIC values during this admission around the time she was in  septic shock and we question whether she may be in an early DIC type of  picture.  However, these labs did trend down throughout her admission  and we felt they were stable and we continued to observe them and did  not perform any immediate treatment.   PROBLEM #3 -  RULE OUT NEPHROLITHIASIS:  There is a question of whether  the patient did have a kidney stone at any point throughout her  admission.  Urology was consulted, however, they felt that it was likely  the patient did have kidney stone at any point.  We worried that the  kidney stone may have been a septic focus in this patient and that was  why she was not recovering so well on the Cipro antibiotics.  However, eventually she did recover well and the pyelonephritis just took a while   to clear and we have no definitive evidence for nephrolithiasis at this  time.   PROBLEM #4 -  PULMONARY EDEMA:  The patient did have some very slight  pulmonary edema on chest x-ray during her admission, especially when she  was in ICU.  This did clear with appropriate diuresis and by the time of  discharge, the patient's chest x-ray was completely clear.   DISCHARGE LABORATORY DATA AND VITAL SIGNS:  Temperature max overnight  was 99.9, blood pressure 90/58, pulse 70, respiratory 21, sating 100% on  room air.   White count was 11, hemoglobin 10, platelets 893, hematocrit 24.7.  Sodium 135, potassium 4, chloride 98, bicarb 28, BUN 10, creatinine 1.3,  glucose 94.  Repeat blood cultures x2 were negative and final.  Central  line culture was negative final.  patient  also had urine culture that  did grow E. coli from her urine.      Thereasa Solo, M.D.  Electronically Signed     AS/MEDQ  D:  05/19/2006  T:  05/19/2006  Job:  [id number]   cc:   Outpatient Clinic  Shirlyn Goltz, MD  Lacretia Leigh. Ninetta Lights, M.D.  Lindaann Slough, M.D.  Thomas A. Cornett, M.D.

## 2010-11-21 LAB — POCT I-STAT, CHEM 8
Calcium, Ion: 1.2
Chloride: 104
Glucose, Bld: 96
HCT: 43
Hemoglobin: 14.6

## 2010-11-21 LAB — URINE MICROSCOPIC-ADD ON

## 2010-11-21 LAB — CBC
HCT: 34.8 — ABNORMAL LOW
HCT: 35.9 — ABNORMAL LOW
HCT: 40.2
Hemoglobin: 13.7
MCHC: 34.1
MCV: 95.6
MCV: 96.8
Platelets: 217
Platelets: 217
RBC: 3.7 — ABNORMAL LOW
RBC: 4.2
RDW: 13.1
WBC: 9

## 2010-11-21 LAB — URINALYSIS, ROUTINE W REFLEX MICROSCOPIC
Bilirubin Urine: NEGATIVE
Glucose, UA: NEGATIVE
Specific Gravity, Urine: 1.011

## 2010-11-21 LAB — BASIC METABOLIC PANEL
Calcium: 8.7
Chloride: 111
Creatinine, Ser: 0.87
GFR calc Af Amer: >60
GFR calc non Af Amer: >60

## 2010-11-21 LAB — DIFFERENTIAL
Basophils Absolute: 0.1
Basophils Relative: 0
Eosinophils Absolute: 0.2
Eosinophils Absolute: 0.2
Eosinophils Relative: 1
Eosinophils Relative: 2
Lymphocytes Relative: 24
Monocytes Absolute: 0.5
Monocytes Relative: 4

## 2010-11-21 LAB — URINE CULTURE

## 2011-12-10 ENCOUNTER — Inpatient Hospital Stay (HOSPITAL_COMMUNITY)
Admission: AD | Admit: 2011-12-10 | Discharge: 2011-12-11 | Disposition: A | Discharge status: Home / Self Care | Payer: 59 | Source: Ambulatory Visit | Attending: Obstetrics & Gynecology | Admitting: Obstetrics & Gynecology

## 2011-12-10 ENCOUNTER — Inpatient Hospital Stay (HOSPITAL_COMMUNITY): Payer: 59

## 2011-12-10 ENCOUNTER — Encounter (HOSPITAL_COMMUNITY): Payer: Self-pay | Admitting: *Deleted

## 2011-12-10 DIAGNOSIS — O009 Unspecified ectopic pregnancy without intrauterine pregnancy: Secondary | ICD-10-CM

## 2011-12-10 DIAGNOSIS — O00109 Unspecified tubal pregnancy without intrauterine pregnancy: Secondary | ICD-10-CM | POA: Insufficient documentation

## 2011-12-10 DIAGNOSIS — O209 Hemorrhage in early pregnancy, unspecified: Secondary | ICD-10-CM

## 2011-12-10 LAB — POCT PREGNANCY, URINE: Preg Test, Ur: POSITIVE — AB

## 2011-12-10 LAB — URINALYSIS, ROUTINE W REFLEX MICROSCOPIC
Bilirubin Urine: NEGATIVE
Glucose, UA: NEGATIVE mg/dL
Ketones, ur: NEGATIVE mg/dL
pH: 6 (ref 5.0–8.0)

## 2011-12-10 NOTE — MAU Note (Signed)
Was told by [facility] on Oct 3 was pregnant. Think last period was end of August. Has been having spotting off and on since the last period in August. Last 2 days bleeding alittle heavier but has slowed last few hours. Some abdominal cramping

## 2011-12-10 NOTE — MAU Note (Signed)
Pt states bleeding has been off and on since the end of August.

## 2011-12-10 NOTE — Progress Notes (Signed)
Pt states she might have had diarrhea yesterday

## 2011-12-11 LAB — CREATININE, SERUM
GFR calc Af Amer: >90 mL/min (ref >90)
GFR calc non Af Amer: >90 mL/min (ref >90)

## 2011-12-11 LAB — AST: AST: 13 U/L (ref 0–37)

## 2011-12-11 LAB — CBC WITH DIFFERENTIAL/PLATELET
Basophils Relative: 1 % (ref 0–1)
Eosinophils Absolute: 0.1 10*3/uL (ref 0.0–0.7)
HCT: 34.5 % — ABNORMAL LOW (ref 36.0–46.0)
Hemoglobin: 12.1 g/dL (ref 12.0–15.0)
MCH: 32.8 pg (ref 26.0–34.0)
MCHC: 35.1 g/dL (ref 30.0–36.0)
Monocytes Absolute: 0.4 10*3/uL (ref 0.1–1.0)
Monocytes Relative: 7 % (ref 3–12)
Neutro Abs: 2.7 10*3/uL (ref 1.7–7.7)

## 2011-12-11 LAB — WET PREP, GENITAL
Trich, Wet Prep: NONE SEEN
Yeast Wet Prep HPF POC: NONE SEEN

## 2011-12-11 MED ORDER — METHOTREXATE INJECTION FOR WOMEN'S HOSPITAL
50.0000 mg/m2 | Freq: Once | INTRAMUSCULAR | Status: AC
  Administered 2011-12-11: 80 mg via INTRAMUSCULAR
  Filled 2011-12-11: qty 1.6

## 2011-12-11 NOTE — MAU Provider Note (Signed)
History     CSN: [id number]  Arrival date and time: 12/10/11 2235   First Provider Initiated Contact with Patient 12/10/11 2356      Chief Complaint  Patient presents with  . Vaginal Bleeding   HPI Connie Wells is a 26 y.o. female who presents to MAU with vaginal bleeding. She states she has had bleeding off and on since August. She found out last week at her PCP's office that she was pregnant. Abdominal cramping x one week. Symptoms increased today. The history was provided by the patient.  OB History    Grav Para Term Preterm Abortions TAB SAB Ect Mult Living   1 0 0 0 0 0 0 0 0 0       Past Medical History  Diagnosis Date  . Chronic kidney disease     No past surgical history on file.  Family History  Problem Relation Age of Onset  . Diabetes Mother     History  Substance Use Topics  . Smoking status: Current Every Day Smoker  . Smokeless tobacco: Not on file  . Alcohol Use: No    Allergies: No Known Allergies  No prescriptions prior to admission    Review of Systems  Constitutional: Negative for fever, chills and weight loss.  HENT: Negative for congestion and sore throat.   Eyes: Negative.   Respiratory: Negative for shortness of breath.   Cardiovascular: Negative for chest pain.  Gastrointestinal: Positive for abdominal pain (cramping). Negative for heartburn, nausea and vomiting.  Genitourinary: Positive for frequency. Negative for dysuria and urgency.  Musculoskeletal: Positive for back pain.  Skin: Negative for rash.  Neurological: Negative for dizziness and headaches.  Psychiatric/Behavioral: Negative for depression. The patient is not nervous/anxious and does not have insomnia.    Physical Exam   Blood pressure 102/64, pulse 84, temperature 97.8 F (36.6 C), temperature source Oral, resp. rate 20, height 5' 4.5" (1.638 m), weight 125 lb 9.6 oz (56.972 kg), last menstrual period 10/24/2011.  Physical Exam  Nursing note and vitals  reviewed. Constitutional: She is oriented to person, place, and time. She appears well-developed and well-nourished. No distress.  HENT:  Head: Normocephalic and atraumatic.  Eyes: EOM are normal.  Neck: Neck supple.  Cardiovascular: Normal rate.   Respiratory: Effort normal.  GI: Soft. There is tenderness in the right lower quadrant. There is no rebound, no guarding and no CVA tenderness.  Genitourinary:       External genitalia without lesions. Scant blood vaginal vault. Cervix long, closed, no CMT, no adnexal tenderness. Uterus without palpable enlargement.  Musculoskeletal: Normal range of motion.  Neurological: She is alert and oriented to person, place, and time.  Skin: Skin is warm and dry.  Psychiatric: She has a normal mood and affect. Her behavior is normal. Judgment and thought content normal.   Results for orders placed during the hospital encounter of 12/10/11 (from the past 24 hour(s))  URINALYSIS, ROUTINE W REFLEX MICROSCOPIC     Status: Abnormal   Collection Time   12/10/11 11:00 PM      Component Value Range   Color, Urine YELLOW  YELLOW   APPearance CLEAR  CLEAR   Specific Gravity, Urine 1.020  1.005 - 1.030   pH 6.0  5.0 - 8.0   Glucose, UA NEGATIVE  NEGATIVE mg/dL   Hgb urine dipstick LARGE (*) NEGATIVE   Bilirubin Urine NEGATIVE  NEGATIVE   Ketones, ur NEGATIVE  NEGATIVE mg/dL   Protein, ur NEGATIVE  NEGATIVE  mg/dL   Urobilinogen, UA 0.2  0.0 - 1.0 mg/dL   Nitrite NEGATIVE  NEGATIVE   Leukocytes, UA NEGATIVE  NEGATIVE  URINE MICROSCOPIC-ADD ON     Status: Normal   Collection Time   12/10/11 11:00 PM      Component Value Range   Squamous Epithelial / LPF RARE  RARE   WBC, UA 0-2  <3 WBC/hpf   RBC / HPF 0-2  <3 RBC/hpf   Urine-Other MUCOUS PRESENT    POCT PREGNANCY, URINE     Status: Abnormal   Collection Time   12/10/11 11:05 PM      Component Value Range   Preg Test, Ur POSITIVE (*) NEGATIVE  CBC WITH DIFFERENTIAL     Status: Abnormal   Collection  Time   12/10/11 11:50 PM      Component Value Range   WBC 6.3  4.0 - 10.5 K/uL   RBC 3.69 (*) 3.87 - 5.11 MIL/uL   Hemoglobin 12.1  12.0 - 15.0 g/dL   HCT 84.6 (*) 96.2 - 95.2 %   MCV 93.5  78.0 - 100.0 fL   MCH 32.8  26.0 - 34.0 pg   MCHC 35.1  30.0 - 36.0 g/dL   RDW 84.1  32.4 - 40.1 %   Platelets 165  150 - 400 K/uL   Neutrophils Relative 43  43 - 77 %   Neutro Abs 2.7  1.7 - 7.7 K/uL   Lymphocytes Relative 47 (*) 12 - 46 %   Lymphs Abs 2.9  0.7 - 4.0 K/uL   Monocytes Relative 7  3 - 12 %   Monocytes Absolute 0.4  0.1 - 1.0 K/uL   Eosinophils Relative 2  0 - 5 %   Eosinophils Absolute 0.1  0.0 - 0.7 K/uL   Basophils Relative 1  0 - 1 %   Basophils Absolute 0.1  0.0 - 0.1 K/uL  HCG, QUANTITATIVE, PREGNANCY     Status: Abnormal   Collection Time   12/10/11 11:50 PM      Component Value Range   hCG, Beta Chain, Quant, S 205 (*) <5 mIU/mL  ABO/RH     Status: Normal (Preliminary result)   Collection Time   12/10/11 11:50 PM      Component Value Range   ABO/RH(D) A POS    AST     Status: Normal   Collection Time   12/10/11 11:50 PM      Component Value Range   AST 13  0 - 37 U/L  BUN     Status: Normal   Collection Time   12/10/11 11:50 PM      Component Value Range   BUN 9  6 - 23 mg/dL  CREATININE, SERUM     Status: Normal   Collection Time   12/10/11 11:50 PM      Component Value Range   Creatinine, Ser 0.88  0.50 - 1.10 mg/dL   GFR calc non Af Amer >90  >90 mL/min   GFR calc Af Amer >90  >90 mL/min  WET PREP, GENITAL     Status: Abnormal   Collection Time   12/11/11 12:12 AM      Component Value Range   Yeast Wet Prep HPF POC NONE SEEN  NONE SEEN   Trich, Wet Prep NONE SEEN  NONE SEEN   Clue Cells Wet Prep HPF POC FEW (*) NONE SEEN   WBC, Wet Prep HPF POC FEW (*) NONE SEEN   US  Ob Comp Less 14 Wks  12/11/2011  *RADIOLOGY REPORT*  Clinical Data: Early pregnancy with vaginal bleeding.  OBSTETRIC <14 WK [place] AND TRANSVAGINAL OB US  Technique:  Both  transabdominal and transvaginal ultrasound examinations were performed for complete evaluation of the gestation as well as the maternal uterus, adnexal regions, and pelvic cul-de-sac.  Transvaginal technique was performed to assess early pregnancy.  Comparison:  06/23/2008  Intrauterine gestational sac:  Not identified.  Embryo: Not visualized  Maternal uterus/adnexae: Both ovaries are normal in appearance.  Within the right adnexa, inferior to the ovary, is a 1.8 x 1.0 x 1.0 cm focus of mild hypoechogenicity with a suggestion of vascularity on image 43.  No significant free fluid.  IMPRESSION: Lack of intrauterine gestational sac.  1.8 cm focus of soft tissue echogenicity in the right adnexa, separate from the right ovary. This is suspicious for ectopic pregnancy.  Findings were called to the patient's nurse practitioner, Deuce Paternoster, N.P. at 12:51 a.m.   Original Report Authenticated By: Consuello Bossier, M.D.    US Ob Transvaginal  12/11/2011  *RADIOLOGY REPORT*  Clinical Data: Early pregnancy with vaginal bleeding.  OBSTETRIC <14 WK [place] AND TRANSVAGINAL OB US  Technique:  Both transabdominal and transvaginal ultrasound examinations were performed for complete evaluation of the gestation as well as the maternal uterus, adnexal regions, and pelvic cul-de-sac.  Transvaginal technique was performed to assess early pregnancy.  Comparison:  06/23/2008  Intrauterine gestational sac:  Not identified.  Embryo: Not visualized  Maternal uterus/adnexae: Both ovaries are normal in appearance.  Within the right adnexa, inferior to the ovary, is a 1.8 x 1.0 x 1.0 cm focus of mild hypoechogenicity with a suggestion of vascularity on image 43.  No significant free fluid.  IMPRESSION: Lack of intrauterine gestational sac.  1.8 cm focus of soft tissue echogenicity in the right adnexa, separate from the right ovary. This is suspicious for ectopic pregnancy.  Findings were called to the patient's nurse practitioner, Keigen Caddell, N.P. at 12:51  a.m.   Original Report Authenticated By: Consuello Bossier, M.D.    MAU Course: call from Radiologist, adnexal mass most likely ectopic pregnancy.   Discussed with Dr. Macon Large and will offer patient MTX.  Procedures   Assessment: 26 y.o. female with right ectopic pregnancy  Plan:  Discussed results with patient and discussed MTX in detail   Written information regarding ectopic pregnancy and MTX given to patient   Discussed need for close follow up, ectopic precautions given. Discussed with the patient and all questioned fully answered.   Connie Constancio, RN, FNP, [facility] 12/11/2011, 2:34 AM

## 2011-12-11 NOTE — Discharge Instructions (Signed)
NO SEX, NO TAMPONS, NOTHING IN THE VAGINA UNTIL FOLLOW UP. IF YOU HAVE ANY PROBLEMS BEFORE YOUR RETURN DATE Saturday, RETURN IMMEDIATELY.

## 2011-12-11 NOTE — Progress Notes (Signed)
Patient given methotrexate information prior to administration of medication. Information reviewed. Pt given comfort information on discharge.

## 2011-12-12 NOTE — MAU Provider Note (Signed)
Attestation of Attending Supervision of Advanced Practitioner (CNM/NP): Evaluation and management procedures were performed by the Advanced Practitioner under my supervision and collaboration.  I have reviewed the Advanced Practitioner's note and chart, and I agree with the management and plan.  Coy Rochford, MD, FACOG Attending Obstetrician & Gynecologist Faculty Practice, [facility]  

## 2011-12-14 ENCOUNTER — Inpatient Hospital Stay (HOSPITAL_COMMUNITY)
Admission: AD | Admit: 2011-12-14 | Discharge: 2011-12-14 | Disposition: A | Discharge status: Home / Self Care | Payer: 59 | Source: Ambulatory Visit | Attending: Obstetrics & Gynecology | Admitting: Obstetrics & Gynecology

## 2011-12-14 DIAGNOSIS — O3680X0 Pregnancy with inconclusive fetal viability, not applicable or unspecified: Secondary | ICD-10-CM

## 2011-12-14 DIAGNOSIS — O009 Unspecified ectopic pregnancy without intrauterine pregnancy: Secondary | ICD-10-CM

## 2011-12-14 DIAGNOSIS — O00109 Unspecified tubal pregnancy without intrauterine pregnancy: Secondary | ICD-10-CM | POA: Insufficient documentation

## 2011-12-14 NOTE — MAU Note (Signed)
Threasa Heads CNM discussed lab results with pt and then plan of care. Pt then d/c home

## 2011-12-14 NOTE — Progress Notes (Signed)
S:  Pt here for follow-up BHCG s/p MTX at day #4.  No bleeding or pain.  BHCG on 10/16 was 205.   O:   Filed Vitals:   12/14/11 2255  BP: 107/64  Pulse: 78  Temp: 98.7 F (37.1 C)  Resp: 20   Results for Connie Wells, Connie Wells (MRN [id number]) as of 12/14/2011 23:24  Ref. Range 12/10/2011 23:50 12/14/2011 22:42  hCG, Beta Chain, Quant, S Latest Range: <5 mIU/mL 205 (H) 120 (H)   A:  Follow-up lab P:  Return on Tuesday, (Day#7) for BHCG. Ectopic precautions given [facility]

## 2011-12-14 NOTE — MAU Note (Signed)
Here for repeat BHCG after receiving MTX on 12/11/11. Reports scant spotting yest. But none today. No pain

## 2011-12-17 ENCOUNTER — Inpatient Hospital Stay (HOSPITAL_COMMUNITY)
Admission: AD | Admit: 2011-12-17 | Discharge: 2011-12-18 | Disposition: A | Discharge status: Hospice | Payer: 59 | Source: Ambulatory Visit | Attending: Obstetrics & Gynecology | Admitting: Obstetrics & Gynecology

## 2011-12-17 DIAGNOSIS — Z09 Encounter for follow-up examination after completed treatment for conditions other than malignant neoplasm: Secondary | ICD-10-CM

## 2011-12-17 DIAGNOSIS — O008 Other ectopic pregnancy without intrauterine pregnancy: Secondary | ICD-10-CM

## 2011-12-17 DIAGNOSIS — O00109 Unspecified tubal pregnancy without intrauterine pregnancy: Secondary | ICD-10-CM | POA: Insufficient documentation

## 2011-12-18 NOTE — MAU Provider Note (Signed)
Connie Wells is a 26 y.o. female who presents to MAU for follow up Bhcg. She was evaluated here 12/10/11 and her Bhcg was 205 and she received MTX for an ectopic pregnancy. She returned day 4 s/p MTX and the Bhcg had dropped to 120. She returns tonight and the Bhcg is 57. She reports no pain tonight.   BP 96/48  Pulse 62  Temp 97.9 F (36.6 C) (Oral)  Resp 16  LMP 10/24/2011   Results for orders placed during the hospital encounter of 12/17/11 (from the past 24 hour(s))  HCG, QUANTITATIVE, PREGNANCY     Status: Abnormal   Collection Time   12/18/11 12:05 AM      Component Value Range   hCG, Beta Chain, Quant, S 57 (*) <5 mIU/mL   Assessment: 26 y.o. female here for f/u Bhcg  Plan:  Return weekly for follow up until bhcg is zero   Continue ectopic precautions

## 2011-12-18 NOTE — MAU Note (Signed)
Pt here for follow for [facility] on day 7 following methotrexate.  States she has had to wear a pad all day today for sm ant  pink bleeding.  Denies any pain this evening.

## 2011-12-18 NOTE — MAU Provider Note (Signed)
Attestation of Attending Supervision of Advanced Practitioner (CNM/NP): Evaluation and management procedures were performed by the Advanced Practitioner under my supervision and collaboration.  I have reviewed the Advanced Practitioner's note and chart, and I agree with the management and plan.  Lavaeh Bau, MD, FACOG Attending Obstetrician & Gynecologist Faculty Practice, [facility]  

## 2011-12-25 ENCOUNTER — Inpatient Hospital Stay (HOSPITAL_COMMUNITY)
Admission: AD | Admit: 2011-12-25 | Discharge: 2011-12-25 | Disposition: A | Discharge status: Home / Self Care | Payer: 59 | Source: Ambulatory Visit | Attending: Obstetrics & Gynecology | Admitting: Obstetrics & Gynecology

## 2011-12-25 DIAGNOSIS — Z09 Encounter for follow-up examination after completed treatment for conditions other than malignant neoplasm: Secondary | ICD-10-CM

## 2011-12-25 DIAGNOSIS — O008 Other ectopic pregnancy without intrauterine pregnancy: Secondary | ICD-10-CM

## 2011-12-25 DIAGNOSIS — O00109 Unspecified tubal pregnancy without intrauterine pregnancy: Secondary | ICD-10-CM | POA: Insufficient documentation

## 2011-12-25 LAB — HCG, QUANTITATIVE, PREGNANCY: hCG, Beta Chain, Quant, S: 17 m[IU]/mL — ABNORMAL HIGH (ref <5)

## 2011-12-25 NOTE — MAU Note (Signed)
PT IN TRIAGE- TOLD LAB RESULTS.   NO PAIN.  HAD  SMALL AMT VAG BLEEDING  ON Sunday.

## 2011-12-25 NOTE — MAU Provider Note (Signed)
Connie Wells is a 26 y.o. female who presents to MAU for follow up Bhcg s/p MTX. She is returning weekly. She denies any pain or bleeding. Her Bhcg last week was 57. Tonight the Bhcg has dropped to 17.  Results for orders placed during the hospital encounter of 12/25/11 (from the past 24 hour(s))  HCG, QUANTITATIVE, PREGNANCY     Status: Abnormal   Collection Time   12/25/11 12:05 AM      Component Value Range   hCG, Beta Chain, Quant, S 17 (*) <5 mIU/mL   Patient to return in one week. She will continue ectopic precautions.

## 2011-12-25 NOTE — MAU Note (Signed)
TOLD TO RETURN ON Tuesday FOR REPEAT LABS.    SAYS GOING OUT OF TOWN-BE BACK Tuesday.

## 2012-01-01 ENCOUNTER — Inpatient Hospital Stay (HOSPITAL_COMMUNITY)
Admission: AD | Admit: 2012-01-01 | Discharge: 2012-01-02 | Disposition: A | Discharge status: Home / Self Care | Payer: 59 | Source: Ambulatory Visit | Attending: Obstetrics and Gynecology | Admitting: Obstetrics and Gynecology

## 2012-01-01 DIAGNOSIS — O00109 Unspecified tubal pregnancy without intrauterine pregnancy: Secondary | ICD-10-CM | POA: Insufficient documentation

## 2012-01-01 DIAGNOSIS — O009 Unspecified ectopic pregnancy without intrauterine pregnancy: Secondary | ICD-10-CM

## 2012-01-01 NOTE — Progress Notes (Signed)
S:  Pt reports she is here for follow-up blood work after MTX on 12/10/11, states she is having no pain but is having vaginal bleeding like a period (approx 4 pads a day).  Here for her weekly BHCG.  O: Filed Vitals:   01/01/12 2310  BP: 108/96  Pulse: 79  Resp: 20    D. Poe assumes care of pt to develop a plan of care.

## 2012-01-01 NOTE — MAU Note (Signed)
Pt reports she is here for follow up blood work after MTX, states she is having no pain but is having vaginal bleeding like a period (4 pads per day)

## 2012-01-02 DIAGNOSIS — O009 Unspecified ectopic pregnancy without intrauterine pregnancy: Secondary | ICD-10-CM

## 2012-01-02 LAB — HCG, QUANTITATIVE, PREGNANCY: hCG, Beta Chain, Quant, S: <1 m[IU]/mL (ref <5)

## 2012-01-02 NOTE — MAU Provider Note (Signed)
26 yo G1P0010 here for weekly quant s/p MTX. No pain. Started menses 4 days ago. Undecided re contraception. Last week quant had dropped to 17.  Filed Vitals:   01/01/12 2310  BP: 108/96  Pulse: 79  Resp: 20   Results for orders placed during the hospital encounter of 01/01/12 (from the past 24 hour(s))  HCG, QUANTITATIVE, PREGNANCY     Status: Normal   Collection Time   01/01/12 11:15 PM      Component Value Range   hCG, Beta Chain, Quant, S <1  <5 mIU/mL   A: Appropriate drop s/p MTX  P: F/U GYN Clinic 2 wks.  Danae Orleans, CNM 01/02/2012 12:43 AM

## 2012-01-04 NOTE — MAU Provider Note (Signed)
Attestation of Attending Supervision of Advanced Practitioner: Evaluation and management procedures were performed by the PA/NP/CNM/OB Fellow under my supervision/collaboration. Chart reviewed and agree with management and plan.  Tobey Lippard V 01/04/2012 8:18 AM

## 2012-01-29 ENCOUNTER — Encounter: Payer: 59 | Admitting: Obstetrics and Gynecology

## 2012-07-21 ENCOUNTER — Emergency Department (HOSPITAL_COMMUNITY)
Admission: EM | Admit: 2012-07-21 | Discharge: 2012-07-22 | Disposition: A | Discharge status: Home / Self Care | Payer: Self-pay | Attending: Emergency Medicine | Admitting: Emergency Medicine

## 2012-07-21 ENCOUNTER — Encounter (HOSPITAL_COMMUNITY): Payer: Self-pay | Admitting: *Deleted

## 2012-07-21 DIAGNOSIS — O210 Mild hyperemesis gravidarum: Secondary | ICD-10-CM | POA: Insufficient documentation

## 2012-07-21 DIAGNOSIS — N189 Chronic kidney disease, unspecified: Secondary | ICD-10-CM | POA: Insufficient documentation

## 2012-07-21 DIAGNOSIS — O26839 Pregnancy related renal disease, unspecified trimester: Secondary | ICD-10-CM | POA: Insufficient documentation

## 2012-07-21 DIAGNOSIS — O99330 Smoking (tobacco) complicating pregnancy, unspecified trimester: Secondary | ICD-10-CM | POA: Insufficient documentation

## 2012-07-21 DIAGNOSIS — Z3490 Encounter for supervision of normal pregnancy, unspecified, unspecified trimester: Secondary | ICD-10-CM

## 2012-07-21 LAB — PREGNANCY, URINE: Preg Test, Ur: POSITIVE — AB

## 2012-07-21 LAB — POCT I-STAT, CHEM 8
Calcium, Ion: 1.19 mmol/L (ref 1.12–1.23)
Creatinine, Ser: 0.8 mg/dL (ref 0.50–1.10)
Glucose, Bld: 89 mg/dL (ref 70–99)
HCT: 38 % (ref 36.0–46.0)
Hemoglobin: 12.9 g/dL (ref 12.0–15.0)
Potassium: 3.3 mEq/L — ABNORMAL LOW (ref 3.5–5.1)
TCO2: 23 mmol/L (ref 0–100)

## 2012-07-21 LAB — URINALYSIS, ROUTINE W REFLEX MICROSCOPIC
Bilirubin Urine: NEGATIVE
Hgb urine dipstick: NEGATIVE
Ketones, ur: 40 mg/dL — AB
Nitrite: NEGATIVE
Protein, ur: NEGATIVE mg/dL
Specific Gravity, Urine: 1.031 — ABNORMAL HIGH (ref 1.005–1.030)
Urobilinogen, UA: 1 mg/dL (ref 0.0–1.0)

## 2012-07-21 MED ORDER — ONDANSETRON 4 MG PO TBDP
4.0000 mg | ORAL_TABLET | Freq: Once | ORAL | Status: AC
  Administered 2012-07-21: 4 mg via ORAL
  Filled 2012-07-21: qty 1

## 2012-07-21 MED ORDER — ONDANSETRON HCL 4 MG PO TABS: 4.0000 mg | ORAL_TABLET | Freq: Four times a day (QID) | ORAL | Status: DC

## 2012-07-21 NOTE — ED Provider Notes (Addendum)
History     CSN: [id number]  Arrival date & time 07/21/12  2040   First MD Initiated Contact with Patient 07/21/12 2148      Chief Complaint  Patient presents with  . Emesis  . Amenorrhea    (Consider location/radiation/quality/duration/timing/severity/associated sxs/prior treatment) Patient is a 27 y.o. female presenting with vomiting. The history is provided by the patient.  Emesis Severity:  Moderate Duration:  2 days Timing:  Intermittent Number of daily episodes:  Numerous today.  before intermittent Quality:  Stomach contents Progression:  Worsening Chronicity:  New Relieved by:  Nothing Worsened by:  Food smell and liquids (eating) Ineffective treatments:  None tried Associated symptoms: no abdominal pain, no chills, no cough, no fever and no myalgias   Risk factors: pregnant now   Risk factors comment:  LMP at the end of april and UPT done last week was positive   Past Medical History  Diagnosis Date  . Chronic kidney disease     Past Surgical History  Procedure Laterality Date  . Ectopic pregnancy surgery      Family History  Problem Relation Age of Onset  . Diabetes Mother     History  Substance Use Topics  . Smoking status: Current Every Day Smoker  . Smokeless tobacco: Not on file  . Alcohol Use: No    OB History   Grav Para Term Preterm Abortions TAB SAB Ect Mult Living   1 0 0 0 0 0 0 0 0 0       Review of Systems  Constitutional: Negative for chills.  Gastrointestinal: Positive for nausea and vomiting. Negative for abdominal pain.  Genitourinary: Negative for vaginal bleeding, vaginal discharge and vaginal pain.  Musculoskeletal: Negative for myalgias.  All other systems reviewed and are negative.    Allergies  Review of patient's allergies indicates no known allergies.  Home Medications   Current Outpatient Rx  Name  Route  Sig  Dispense  Refill  . bismuth subsalicylate (PEPTO BISMOL) 262 MG/15ML suspension   Oral   Take 15  mLs by mouth every 4 (four) hours as needed for indigestion.           BP 127/67  Pulse 87  Temp(Src) 98.9 F (37.2 C)  Resp 20  SpO2 100%  LMP 06/07/2012  Physical Exam  Nursing note and vitals reviewed. Constitutional: She is oriented to person, place, and time. She appears well-developed and well-nourished. No distress.  HENT:  Head: Normocephalic and atraumatic.  Mouth/Throat: Oropharynx is clear and moist.  Eyes: Conjunctivae and EOM are normal. Pupils are equal, round, and reactive to light.  Neck: Normal range of motion. Neck supple.  Cardiovascular: Normal rate, regular rhythm and intact distal pulses.   No murmur heard. Pulmonary/Chest: Effort normal and breath sounds normal. No respiratory distress. She has no wheezes. She has no rales.  Abdominal: Soft. She exhibits no distension. There is no tenderness. There is no rebound and no guarding.  Musculoskeletal: Normal range of motion. She exhibits no edema and no tenderness.  Neurological: She is alert and oriented to person, place, and time.  Skin: Skin is warm and dry. No rash noted. No erythema.  Psychiatric: She has a normal mood and affect. Her behavior is normal.    ED Course  Procedures (including critical care time)  Labs Reviewed  POCT I-STAT, CHEM 8 - Abnormal; Notable for the following:    Potassium 3.3 (*)    All other components within normal limits  PREGNANCY, URINE  URINALYSIS, ROUTINE W REFLEX MICROSCOPIC   No results found.  EMERGENCY DEPARTMENT [place] PREGNANCY "Study: Limited Ultrasound of the Pelvis"  INDICATIONS:Pregnancy(required) Multiple views of the uterus and pelvic cavity are obtained with a multi-frequency probe.  APPROACH:Transabdominal   PERFORMED BY: Myself  IMAGES ARCHIVED?: No  LIMITATIONS: none  PREGNANCY FREE FLUID: None  PREGNANCY UTERUS FINDINGS:Uterus enlarged and Gestational sac noted ADNEXAL FINDINGS:Left ovary not seen and Right ovary not seen  PREGNANCY  FINDINGS: Intrauterine gestational sac noted and Fetal heart activity seen  INTERPRETATION: Viable intrauterine pregnancy  GESTATIONAL AGE, ESTIMATE: [redacted]w[redacted]d  FETAL HEART RATE: 143  COMMENT(Estimate of Gestational Age):  Normal IUP without complication    1. Intrauterine normal pregnancy       MDM   Patient here do to vomiting.  LMP was April 21. She took a pregnancy test about one week ago and it was positive. She states she has vomited multiple times today and feels nauseated but denies any abdominal pain, vaginal bleeding or discharge. No dysuria or fever.  Bedside ultrasound shows an uncomplicated IUP at 7 weeks 4 days with fetal heart rate at 143. Patient does have a prior history of ectopic pregnancy and one normal pregnancy to term without complications. Patient given ODT Zofran for nausea. UA will be performed to ensure no urinary tract infection. Patient will be given resources for [facility] for further OB care   UA neg for infection.  Pt d/ced with zofran.     Gwyneth Sprout, MD 07/21/12 2240  Gwyneth Sprout, MD 07/22/12 1610

## 2012-07-21 NOTE — ED Notes (Signed)
Pt states 5/21 started vomiting; unable to eat; shakes; feels like struggling to breath; states feels like unable to take a deep breath; oxygen level 100% and talking nonstop in triage; no respiratory distress noted;

## 2012-07-21 NOTE — ED Notes (Signed)
Pt concerned about possibility of pregnancy

## 2012-07-22 NOTE — ED Notes (Signed)
Patient is alert and oriented x3.  She was given DC instructions and follow up visit instructions.  Patient gave verbal understanding. She was DC ambulatory under her own power to home.  V/S stable.  He was not showing any signs of distress on DC 

## 2013-02-25 NOTE — L&D Delivery Note (Signed)
Delivery Note At 4:27 PM a viable female was delivered via Vaginal, Spontaneous Delivery (Presentation: ;  ).  APGAR: 9, 9; weight pending .   Placenta status: Intact, Spontaneous.  Cord: 3 vessels with the following complications: None.  Cord pH: pending  Anesthesia: None  Episiotomy: None Lacerations: None Suture Repair: N/A Est. Blood Loss (mL): 250  Mom to postpartum.  Baby to Couplet care / Skin to Skin.  Archie Patten 11/14/2013, 5:16 PM

## 2013-02-25 NOTE — L&D Delivery Note (Signed)
Agree with assessment. 

## 2013-04-26 ENCOUNTER — Emergency Department (HOSPITAL_COMMUNITY)
Admission: EM | Admit: 2013-04-26 | Discharge: 2013-04-26 | Disposition: A | Discharge status: Home / Self Care | Payer: No Typology Code available for payment source | Attending: Emergency Medicine | Admitting: Emergency Medicine

## 2013-04-26 ENCOUNTER — Encounter (HOSPITAL_COMMUNITY): Payer: Self-pay | Admitting: Emergency Medicine

## 2013-04-26 DIAGNOSIS — Z792 Long term (current) use of antibiotics: Secondary | ICD-10-CM | POA: Insufficient documentation

## 2013-04-26 DIAGNOSIS — O99330 Smoking (tobacco) complicating pregnancy, unspecified trimester: Secondary | ICD-10-CM | POA: Insufficient documentation

## 2013-04-26 DIAGNOSIS — N189 Chronic kidney disease, unspecified: Secondary | ICD-10-CM | POA: Insufficient documentation

## 2013-04-26 DIAGNOSIS — O9989 Other specified diseases and conditions complicating pregnancy, childbirth and the puerperium: Secondary | ICD-10-CM | POA: Insufficient documentation

## 2013-04-26 DIAGNOSIS — O210 Mild hyperemesis gravidarum: Secondary | ICD-10-CM | POA: Insufficient documentation

## 2013-04-26 DIAGNOSIS — Z8619 Personal history of other infectious and parasitic diseases: Secondary | ICD-10-CM | POA: Insufficient documentation

## 2013-04-26 DIAGNOSIS — N760 Acute vaginitis: Secondary | ICD-10-CM | POA: Insufficient documentation

## 2013-04-26 DIAGNOSIS — O09291 Supervision of pregnancy with other poor reproductive or obstetric history, first trimester: Secondary | ICD-10-CM

## 2013-04-26 DIAGNOSIS — O2390 Unspecified genitourinary tract infection in pregnancy, unspecified trimester: Secondary | ICD-10-CM | POA: Insufficient documentation

## 2013-04-26 DIAGNOSIS — A499 Bacterial infection, unspecified: Secondary | ICD-10-CM | POA: Insufficient documentation

## 2013-04-26 DIAGNOSIS — O4690 Antepartum hemorrhage, unspecified, unspecified trimester: Secondary | ICD-10-CM | POA: Insufficient documentation

## 2013-04-26 DIAGNOSIS — B9689 Other specified bacterial agents as the cause of diseases classified elsewhere: Secondary | ICD-10-CM | POA: Insufficient documentation

## 2013-04-26 HISTORY — DX: Herpesviral infection of urogenital system, unspecified: A60.00

## 2013-04-26 LAB — WET PREP, GENITAL: Trich, Wet Prep: NONE SEEN

## 2013-04-26 LAB — URINALYSIS, ROUTINE W REFLEX MICROSCOPIC
Bilirubin Urine: NEGATIVE
Glucose, UA: NEGATIVE mg/dL
Hgb urine dipstick: NEGATIVE
Ketones, ur: NEGATIVE mg/dL
Leukocytes, UA: NEGATIVE
Nitrite: NEGATIVE
Protein, ur: NEGATIVE mg/dL
Specific Gravity, Urine: 1.024 (ref 1.005–1.030)
Urobilinogen, UA: 1 mg/dL (ref 0.0–1.0)
pH: 8 (ref 5.0–8.0)

## 2013-04-26 LAB — PREGNANCY, URINE: Preg Test, Ur: POSITIVE — AB

## 2013-04-26 MED ORDER — METRONIDAZOLE 500 MG PO TABS: 500.0000 mg | ORAL_TABLET | Freq: Two times a day (BID) | ORAL | Status: DC

## 2013-04-26 MED ORDER — LIDOCAINE HCL 1 % IJ SOLN
INTRAMUSCULAR | Status: AC
  Administered 2013-04-26: 2.1 mL
  Filled 2013-04-26: qty 20

## 2013-04-26 MED ORDER — ONDANSETRON 4 MG PO TBDP
4.0000 mg | ORAL_TABLET | Freq: Once | ORAL | Status: AC
  Administered 2013-04-26: 4 mg via ORAL
  Filled 2013-04-26: qty 1

## 2013-04-26 MED ORDER — AZITHROMYCIN 1 G PO PACK
1.0000 g | PACK | Freq: Once | ORAL | Status: AC
  Administered 2013-04-26: 1 g via ORAL
  Filled 2013-04-26: qty 1

## 2013-04-26 MED ORDER — CEFTRIAXONE SODIUM 250 MG IJ SOLR
250.0000 mg | Freq: Once | INTRAMUSCULAR | Status: AC
  Administered 2013-04-26: 250 mg via INTRAMUSCULAR
  Filled 2013-04-26: qty 250

## 2013-04-26 NOTE — ED Notes (Signed)
Pt reports heavy vaginal discharge, describes as creamy, white/cloudy, foul smelling. Denies burning sensation, fevers and chills. Pt reports she took 2 home pregnancy test that indicated positive. Emesis X3 in last 24hrs.

## 2013-04-26 NOTE — Discharge Instructions (Signed)
Bacterial Vaginosis Bacterial vaginosis is a vaginal infection that occurs when the normal balance of bacteria in the vagina is disrupted. It results from an overgrowth of certain bacteria. This is the most common vaginal infection in women of childbearing age. Treatment is important to prevent complications, especially in pregnant women, as it can cause a premature delivery. CAUSES  Bacterial vaginosis is caused by an increase in harmful bacteria that are normally present in smaller amounts in the vagina. Several different kinds of bacteria can cause bacterial vaginosis. However, the reason that the condition develops is not fully understood. RISK FACTORS Certain activities or behaviors can put you at an increased risk of developing bacterial vaginosis, including:  Having a new sex partner or multiple sex partners.  Douching.  Using an intrauterine device (IUD) for contraception. Women do not get bacterial vaginosis from toilet seats, bedding, swimming pools, or contact with objects around them. SIGNS AND SYMPTOMS  Some women with bacterial vaginosis have no signs or symptoms. Common symptoms include:  Grey vaginal discharge.  A fishlike odor with discharge, especially after sexual intercourse.  Itching or burning of the vagina and vulva.  Burning or pain with urination. DIAGNOSIS  Your health care provider will take a medical history and examine the vagina for signs of bacterial vaginosis. A sample of vaginal fluid may be taken. Your health care provider will look at this sample under a microscope to check for bacteria and abnormal cells. A vaginal pH test may also be done.  TREATMENT  Bacterial vaginosis may be treated with antibiotic medicines. These may be given in the form of a pill or a vaginal cream. A second round of antibiotics may be prescribed if the condition comes back after treatment.  HOME CARE INSTRUCTIONS   Only take over-the-counter or prescription medicines as  directed by your health care provider.  If antibiotic medicine was prescribed, take it as directed. Make sure you finish it even if you start to feel better.  Do not have sex until treatment is completed.  Tell all sexual partners that you have a vaginal infection. They should see their health care provider and be treated if they have problems, such as a mild rash or itching.  Practice safe sex by using condoms and only having one sex partner. SEEK MEDICAL CARE IF:   Your symptoms are not improving after 3 days of treatment.  You have increased discharge or pain.  You have a fever. MAKE SURE YOU:   Understand these instructions.  Will watch your condition.  Will get help right away if you are not doing well or get worse. FOR MORE INFORMATION  Centers for Disease Control and Prevention, Division of STD Prevention: AppraiserFraud.fi American Sexual Health Association (ASHA): www.ashastd.org  Document Released: 02/11/2005 Document Revised: 12/02/2012 Document Reviewed: 09/23/2012 [facility] Patient Information 2014 [place].   Emergency Department Resource Guide 1) Find a Doctor and Pay Out of Pocket Although you won't have to find out who is covered by your insurance plan, it is a good idea to ask around and get recommendations. You will then need to call the office and see if the doctor you have chosen will accept you as a new patient and what types of options they offer for patients who are self-pay. Some doctors offer discounts or will set up payment plans for their patients who do not have insurance, but you will need to ask so you aren't surprised when you get to your appointment.  2) Contact Your Local  Health Department Not all health departments have doctors that can see patients for sick visits, but many do, so it is worth a call to see if yours does. If you don't know where your local health department is, you can check in your phone book. The CDC also has a tool to help  you locate your state's health department, and many state websites also have listings of all of their local health departments.  3) Find a [facility] If your illness is not likely to be very severe or complicated, you may want to try a walk in clinic. These are popping up all over the country in pharmacies, drugstores, and shopping centers. They're usually staffed by nurse practitioners or physician assistants that have been trained to treat common illnesses and complaints. They're usually fairly quick and inexpensive. However, if you have serious medical issues or chronic medical problems, these are probably not your best option.  No Primary Care Doctor: - Call Health Connect at  [phone] - they can help you locate a primary care doctor that  accepts your insurance, provides certain services, etc. - Physician Referral Service- [phone]  Chronic Pain Problems: Organization         Address  Phone   Notes  [facility]  [phone] Patients need to be referred by their primary care doctor.   Medication Assistance: Organization         Address  Phone   Notes  [facility] Medication [facility] [address] [place], [place] [address] [phone] --Must be a resident of [facility] -- Must have NO insurance coverage whatsoever (no Medicaid/ Medicare, etc.) -- The pt. MUST have a primary care doctor that directs their care regularly and follows them in the community   MedAssist  [phone]   [employer]  [phone]    Agencies that provide inexpensive medical care: Organization         Address  Phone   Notes  [place]  [phone]   [facility]    [phone]   [facility] [place], [place] [address] [phone]   [place] [address], [place] [phone]   Planned Parenthood    [phone]   [facility]    [phone]   [place] and [address], [place] Phone:  [phone], Fax:  [phone] Hours of Operation:  9 am - 6 pm, M-F.  Also accepts Medicaid/Medicare and self-pay.  [facility] for [place], [address], [place] Phone: [phone], Fax: [phone]. Hours of Operation:  8:30 am - 5:30 pm, M-F.  Also accepts Medicaid and self-pay.  [facility] [address], [place] Phone: [phone]   [place], [place], [place] [phone], Ext. [phone] Mondays & Thursdays: 7-9 AM.  First 15 patients are seen on a first come, first serve basis.    [place] Providers:  Organization         Address  Phone   Notes  [facility] [address], [place] [phone] Also accepts self-pay patients.  [place], [place]  [phone]   [place], [place], [place] [phone]  [place] [address] [place] [phone]   Lucianne Lei [address], [place]   [phone] Only accepts [place] Access [place] patients after they have their name applied to their card.   Self-Pay (no insurance) in [facility]:  Organization         Address  Phone   Notes  Sickle Cell Patients, [facility] Internal Medicine [place] [phone]   [facility] Urgent Care [place] [phone]   [facility] [place]  [place], [place], [place] [phone]   [facility]/Dr. Osei-Bonsu  [address], [place] or [address], [place] [phone] Phone number for both [place] and [place] locations is the same.  Urgent Medical and [facility] [address]  [place] [phone]   [facility] [address], [place] or [address] [phone]   [facility] [address] [place] [phone], phone; [phone], fax Sees patients 1st and 3rd Saturday of every month.  Must not qualify for public or private insurance (i.e. Medicaid, Medicare, [place] Health Choice, Veterans' Benefits)  Household income should be no more than 200% of the poverty level The clinic cannot treat you if you are pregnant or think you are pregnant  Sexually transmitted diseases are not treated at the clinic.    Dental Care: Organization         Address  Phone  Notes  [facility] Department of [facility] [place] [phone] Accepts children up to age 71 who are enrolled in [place] or [place]; pregnant women with a Medicaid card; and children who have applied for Medicaid or [place] Health Choice, but were declined, whose parents can pay a reduced fee at time of service.  [facility] Department [facility]  [address], [place] [phone] Accepts children up to age 43 who are enrolled in [place] or [place]; pregnant women with a Medicaid card; and children who have applied for Medicaid or [place] Health Choice, but were declined, whose parents can pay a reduced fee at time of service.  [place] Adult Dental Access PROGRAM  [place] [phone] Patients are seen by appointment only. Walk-ins are not accepted. [place] will see patients 63 years of age and older. Monday - Tuesday (8am-5pm) Most Wednesdays (8:30-5pm) $30 per visit, cash only  [facility] Adult Dental Access PROGRAM  [address], [facility] [phone] Patients are seen by appointment only. Walk-ins are not accepted. [place] will see patients 55 years of age and older. One Wednesday Evening  (Monthly: Volunteer Based).  $30 per visit, cash only  [place]  [phone] for adults; Children under age 36, call Graduate Pediatric Dentistry at [phone]. Children aged 23-14, please call [phone] to request a pediatric application.  Dental services are provided in all areas of dental care including fillings, crowns and bridges, complete and partial dentures, implants, gum treatment, root canals, and extractions. Preventive care is also provided. Treatment is provided to both adults and children. Patients are selected via a lottery and there is often a waiting list.   [facility] [address] [place]  [phone] [email or web address]  [facility] [address] [place], [place] [phone], Ext. [phone] Second and Fourth Thursday of each month, opens at 6:30 AM; Clinic ends at 9 AM.  Patients are seen on a first-come first-served basis, and a limited number are seen during each clinic.   [facility]  [address] Hillard Danker [place], [place] [phone]   Eligibility Requirements You must have lived in [place], [place], or [place] counties for at least the last three months.   You cannot be eligible for state or federal sponsored [employer], including [employer], [place], or [employer].   You generally cannot be eligible for healthcare insurance through your employer.    How to apply: Eligibility screenings are held every Tuesday and Wednesday afternoon from 1:00 pm until 4:00 pm. You do not need an appointment for the interview!  [facility] [address], [place], [place]   [place]  [place] Department  [place]  [phone]    Behavioral Health Resources in the Community: Intensive Outpatient Programs Organization         Address  Phone  Notes  [place]. [address], [place], [place] [phone]   [facility] Outpatient [address], [place], [place]   ADS: Alcohol & Drug Svcs [address], [place], [place]   [place] [address]  [place], [place] or [phone]   Substance Abuse Resources Organization         Address  Phone  Notes  Alcohol and Drug Services  [phone]   [place]  [phone]   [facility] [place]   Chinita Pester  [phone]   Residential & Outpatient Substance Abuse Program  [phone]   Psychological Services Organization         Address  Phone  Notes  [facility] [employer]  [place]  [phone]   [place] [address] [place] or [phone]    Mobile Crisis Teams Organization         Address  Phone  Notes  [facility], Mobile Crisis Care Unit  [phone]   [facility]ive Psychotherapeutic Services  [address] [place], [place]   [place] [address], [place] [phone]    Self-Help/Support Groups Organization         Address  Phone             Notes  [place]. of [place] - variety of support groups  [address] Call for more information  Narcotics Anonymous (NA), [facility] [address], [employer] [place]  2 meetings at this location   Special educational needs teacher         Address  Phone  Notes  ASAP Residential Treatment [place],    [place]  [phone]   [facility]  [address] [place] [phone], [place], [place]   [place], [place] [phone] Admissions: 8am-3pm M-F  Incentives Substance [place] [address],    [place], [place] [phone]   [facility] [address] Jadene Pierini  [place], [place]   [facility] [address],  [place], [place]   Insight Programs - Intensive Outpatient [place]  [address], [place], [place]   [facility] [facility] [address]  [place], [place] [phone] or [phone]   Residential Treatment Services (RTS) [address] [place], [place] Accepts Medicaid  [facility] [place] [address]  [place] [phone] Substance Abuse/Addiction Treatment   [facility] Organization         Address  Phone  Notes  [facility]  [phone]   Domenic Schwab, PhD [address] Arlis Porta [place], [place]   [phone] or [phone]   [place], [place] [phone]   [place] [address], [place], [place] [phone] Insurance/Medicaid/sponsorship through [facility] and Families [address]                                    [place], [place] [phone] [place] [address], [place] [phone]    Dr. Adele Schilder  [phone]   [facility] Dept. 1) [address], [place] 2) [place] 3)  [address], [place] [phone]  [phone]   [place] [phone] or [phone] (After Hours)

## 2013-04-27 LAB — GC/CHLAMYDIA PROBE AMP
CT Probe RNA: NEGATIVE
GC Probe RNA: NEGATIVE

## 2013-04-27 LAB — HIV ANTIBODY (ROUTINE TESTING W REFLEX): HIV: NONREACTIVE

## 2013-04-30 NOTE — ED Provider Notes (Signed)
CSN: [id number]     Arrival date & time 04/26/13  1652 History   First MD Initiated Contact with Patient 04/26/13 1808     Chief Complaint  Patient presents with  . Exposure to STD  . Nausea     (Consider location/radiation/quality/duration/timing/severity/associated sxs/prior Treatment) HPI  28 year old female with vaginal discharge. First noticed 3 or 4 days ago. Thick and white. The vaginal bleeding. 2 positive home pregnancy test. Last was a period beginning of January. Nausea and vomiting x3 in the past day. No abdominal or back pain. No intervention prior to arrival. No urinary complaints.  Past Medical History  Diagnosis Date  . Chronic kidney disease   . Herpes genitalia    Past Surgical History  Procedure Laterality Date  . Ectopic pregnancy surgery     Family History  Problem Relation Age of Onset  . Diabetes Mother    History  Substance Use Topics  . Smoking status: Current Every Day Smoker -- 5 years    Types: Cigarettes  . Smokeless tobacco: Not on file  . Alcohol Use: Yes     Comment: Occassional   OB History   Grav Para Term Preterm Abortions TAB SAB Ect Mult Living   1 0 0 0 0 0 0 0 0 0      Review of Systems  All systems reviewed and negative, other than as noted in HPI.   Allergies  Review of patient's allergies indicates no known allergies.  Home Medications   Current Outpatient Rx  Name  Route  Sig  Dispense  Refill  . metroNIDAZOLE (FLAGYL) 500 MG tablet   Oral   Take 1 tablet (500 mg total) by mouth 2 (two) times daily.   14 tablet   0    BP 107/66  Pulse 67  Temp(Src) 97.9 F (36.6 C) (Oral)  Resp 18  SpO2 100%  LMP 02/28/2013 Physical Exam  Nursing note and vitals reviewed. Constitutional: She appears well-developed and well-nourished. No distress.  HENT:  Head: Normocephalic and atraumatic.  Eyes: Conjunctivae are normal. Right eye exhibits no discharge. Left eye exhibits no discharge.  Neck: Neck supple.   Cardiovascular: Normal rate, regular rhythm and normal heart sounds.  Exam reveals no gallop and no friction rub.   No murmur heard. Pulmonary/Chest: Effort normal and breath sounds normal. No respiratory distress.  Abdominal: Soft. She exhibits no distension. There is no tenderness.  Genitourinary:  Normal external female genitalia. No concerning lesions noted. Scant whitish vaginal discharge. Cervix normal in appearance.  Musculoskeletal: She exhibits no edema and no tenderness.  Neurological: She is alert.  Skin: Skin is warm and dry.  Psychiatric: She has a normal mood and affect. Her behavior is normal. Thought content normal.    ED Course  Procedures (including critical care time) Labs Review Labs Reviewed  WET PREP, GENITAL - Abnormal; Notable for the following:    Yeast Wet Prep HPF POC RARE (*)    Clue Cells Wet Prep HPF POC FEW (*)    WBC, Wet Prep HPF POC RARE (*)    All other components within normal limits  URINALYSIS, ROUTINE W REFLEX MICROSCOPIC - Abnormal; Notable for the following:    APPearance CLOUDY (*)    All other components within normal limits  PREGNANCY, URINE - Abnormal; Notable for the following:    Preg Test, Ur POSITIVE (*)    All other components within normal limits  GC/CHLAMYDIA PROBE AMP  HIV ANTIBODY (ROUTINE TESTING)   Imaging Review  No results found.   EKG Interpretation None      MDM   Final diagnoses:  BV (bacterial vaginosis)  Pregnancy in first trimester with history of abortion    28 year old female with concern for STD. Requesting empiric treatment. Will prep consistent with bacterial vaginosis. Course of Flagyl. Patient is pregnant. HIV was nonreactive. Outpatient follow up with OB/GYN. Return precautions were discussed.    Virgel Manifold, MD 04/30/13 [phone]

## 2013-05-04 ENCOUNTER — Emergency Department (HOSPITAL_COMMUNITY)
Admission: EM | Admit: 2013-05-04 | Discharge: 2013-05-04 | Disposition: A | Discharge status: Home / Self Care | Payer: No Typology Code available for payment source | Attending: Emergency Medicine | Admitting: Emergency Medicine

## 2013-05-04 ENCOUNTER — Encounter (HOSPITAL_COMMUNITY): Payer: Self-pay | Admitting: Emergency Medicine

## 2013-05-04 ENCOUNTER — Emergency Department (HOSPITAL_COMMUNITY): Payer: No Typology Code available for payment source

## 2013-05-04 DIAGNOSIS — N760 Acute vaginitis: Secondary | ICD-10-CM | POA: Insufficient documentation

## 2013-05-04 DIAGNOSIS — O9989 Other specified diseases and conditions complicating pregnancy, childbirth and the puerperium: Secondary | ICD-10-CM | POA: Insufficient documentation

## 2013-05-04 DIAGNOSIS — O99330 Smoking (tobacco) complicating pregnancy, unspecified trimester: Secondary | ICD-10-CM | POA: Insufficient documentation

## 2013-05-04 DIAGNOSIS — A499 Bacterial infection, unspecified: Secondary | ICD-10-CM | POA: Insufficient documentation

## 2013-05-04 DIAGNOSIS — N189 Chronic kidney disease, unspecified: Secondary | ICD-10-CM | POA: Insufficient documentation

## 2013-05-04 DIAGNOSIS — Z79899 Other long term (current) drug therapy: Secondary | ICD-10-CM | POA: Insufficient documentation

## 2013-05-04 DIAGNOSIS — O2390 Unspecified genitourinary tract infection in pregnancy, unspecified trimester: Secondary | ICD-10-CM | POA: Insufficient documentation

## 2013-05-04 DIAGNOSIS — Z3490 Encounter for supervision of normal pregnancy, unspecified, unspecified trimester: Secondary | ICD-10-CM

## 2013-05-04 DIAGNOSIS — O219 Vomiting of pregnancy, unspecified: Secondary | ICD-10-CM | POA: Insufficient documentation

## 2013-05-04 DIAGNOSIS — R109 Unspecified abdominal pain: Secondary | ICD-10-CM | POA: Insufficient documentation

## 2013-05-04 DIAGNOSIS — B9689 Other specified bacterial agents as the cause of diseases classified elsewhere: Secondary | ICD-10-CM | POA: Insufficient documentation

## 2013-05-04 LAB — URINALYSIS, ROUTINE W REFLEX MICROSCOPIC
Bilirubin Urine: NEGATIVE
Glucose, UA: NEGATIVE mg/dL
HGB URINE DIPSTICK: NEGATIVE
Ketones, ur: NEGATIVE mg/dL
LEUKOCYTES UA: NEGATIVE
NITRITE: NEGATIVE
Protein, ur: NEGATIVE mg/dL
SPECIFIC GRAVITY, URINE: 1.008 (ref 1.005–1.030)
UROBILINOGEN UA: 0.2 mg/dL (ref 0.0–1.0)
pH: 7.5 (ref 5.0–8.0)

## 2013-05-04 LAB — POC URINE PREG, ED: Preg Test, Ur: POSITIVE — AB

## 2013-05-04 NOTE — ED Provider Notes (Signed)
Medical screening examination/treatment/procedure(s) were performed by non-physician practitioner and as supervising physician I was immediately available for consultation/collaboration.   EKG Interpretation None       Threasa Beards, MD 05/04/13 [phone]

## 2013-05-04 NOTE — ED Provider Notes (Signed)
MSE was initiated and I personally evaluated the patient and placed orders (if any) at  2:45 PM on May 04, 2013.  HPI Comments: Connie Wells is a 28 y.o. Female with a history of tubal pregnancy treated non-surgically -- who presents to the Emergency Department complaining of vaginal bleeding (spotting) that occurred two days ago. She has the associated symptoms of constant nausea and intermittent abdominal cramping, but no cramping today. She did not eat anything PTA. Pt had an abortion last year. She is unable to recall her last menstrual cycle but speculates that it was possibly late Dec/early Jan.   Exam:  Gen NAD; Heart RRR, nml S1,S2, no m/r/g; Lungs CTAB; Abd soft, NT, no rebound or guarding; Ext 2+ pedal pulses bilaterally, no edema.  The patient appears stable so that the remainder of the MSE may be completed by another provider.   Carlisle Cater, PA-C 05/04/13 1447

## 2013-05-04 NOTE — ED Notes (Signed)
Chaperoned pelvic exam with Dr. Regenia Skeeter.

## 2013-05-04 NOTE — ED Notes (Addendum)
Pt. Is in ultrasound and cannot obtain labs at this time.

## 2013-05-04 NOTE — ED Notes (Signed)
Initial Contact - pt from U/S to RM6, resting on stretcher, denies complaints.  Skin PWD.  Speaking full/clear sentences.  NAD.

## 2013-05-04 NOTE — ED Provider Notes (Signed)
CSN: [id number]     Arrival date & time 05/04/13  1349 History   First MD Initiated Contact with Patient 05/04/13 1432     Chief Complaint  Patient presents with  . Pregnant      (Consider location/radiation/quality/duration/timing/severity/associated sxs/prior Treatment) HPI Comments: 28 year old female presents with spotting 2 days ago. She noticed she is pregnant couple weeks ago. She's unsure of her last period but thinks it was 2-3 months ago. Besides 2 days ago she's not had any spotting. Over the last week or so she's been having intermittent abdominal cramping. She vomits about once a day. This is her fourth pregnancy. She has one 58-year-old child, had one ectopic pregnancy treated medically, and 1 abortion. The patient was concerned last week she had an STD because her boyfriend states he cheated on her she came here for testing. She's not started the Flagyl he had. Patient states her discharge is been improving despite this. She does not know her blood type. She's never received Rhogam to her knowledge.   Past Medical History  Diagnosis Date  . Chronic kidney disease   . Herpes genitalia    Past Surgical History  Procedure Laterality Date  . Ectopic pregnancy surgery     Family History  Problem Relation Age of Onset  . Diabetes Mother    History  Substance Use Topics  . Smoking status: Current Every Day Smoker -- 5 years    Types: Cigarettes  . Smokeless tobacco: Not on file  . Alcohol Use: Yes     Comment: Occassional   OB History   Grav Para Term Preterm Abortions TAB SAB Ect Mult Living   2 0 0 0 0 0 0 0 0 0      Review of Systems  Constitutional: Negative for fever.  Gastrointestinal: Positive for vomiting and abdominal pain.  Genitourinary: Positive for vaginal bleeding and vaginal discharge. Negative for dysuria.  All other systems reviewed and are negative.      Allergies  Review of patient's allergies indicates no known allergies.  Home Medications    Current Outpatient Rx  Name  Route  Sig  Dispense  Refill  . metroNIDAZOLE (FLAGYL) 500 MG tablet   Oral   Take 1 tablet (500 mg total) by mouth 2 (two) times daily.   14 tablet   0    BP 155/65  Pulse 85  Temp(Src) 98.4 F (36.9 C) (Oral)  Resp 18  SpO2 100%  LMP 02/28/2013 Physical Exam  Nursing note and vitals reviewed. Constitutional: She is oriented to person, place, and time. She appears well-developed and well-nourished. No distress.  HENT:  Head: Normocephalic and atraumatic.  Right Ear: External ear normal.  Left Ear: External ear normal.  Nose: Nose normal.  Mouth/Throat: Oropharynx is clear and moist.  Eyes: Right eye exhibits no discharge. Left eye exhibits no discharge.  Cardiovascular: Normal rate, regular rhythm and normal heart sounds.   Pulmonary/Chest: Effort normal and breath sounds normal.  Abdominal: Soft. There is tenderness in the suprapubic area.  Genitourinary: Vaginal discharge (mild) found.  Os closed  Neurological: She is alert and oriented to person, place, and time.  Skin: Skin is warm and dry.    ED Course  Procedures (including critical care time) Labs Review Labs Reviewed  POC URINE PREG, ED - Abnormal; Notable for the following:    Preg Test, Ur POSITIVE (*)    All other components within normal limits  URINE CULTURE  URINALYSIS, ROUTINE W REFLEX MICROSCOPIC  Imaging Review US Ob Comp Less 14 Wks  05/04/2013   CLINICAL DATA Spotting and pelvic cramping with history of previous ectopic pregnancy  EXAM OBSTETRIC <14 WK [place] AND TRANSVAGINAL OB US  TECHNIQUE Both transabdominal and transvaginal ultrasound examinations were performed for complete evaluation of the gestation as well as the maternal uterus, adnexal regions, and pelvic cul-de-sac. Transvaginal technique was performed to assess early pregnancy.  COMPARISON None.  FINDINGS Intrauterine gestational sac: Visualized/normal in shape.  Yolk sac:  Present  Embryo:  Present  Cardiac  Activity: Present.  Heart Rate:  118 bpm  MSD:  18.7  mm   8 w   3  d  CRL:   2.01  mm   8 w 4 d                  [place] [facility]: December 11, 2013  Maternal uterus/adnexae: The maternal right ovary is normal in appearance. The maternal left ovary is normal in size and exhibits small follicles. In addition there is a a subtle area of decreased echogenicity measuring 2 cm in maximal dimension. Which is not hypervascular.  IMPRESSION 1. There is a viable IUP with estimated gestational age of [redacted] weeks 4 days with estimated date of confinement approximately December 11, 2013. 2. The maternal right ovary is normal in appearance. The maternal left ovary is likely normal as well, but there is a subtle area of decreased echogenicity within the substance of the left ovary measuring 2 cm in maximal dimension. 3. Neither adnexal region demonstrates a hypervascular complex structure to suggest an ectopic pregnancy.  SIGNATURE  Electronically Signed   By: David  Martinique   On: 05/04/2013 15:50   US Ob Transvaginal  05/04/2013   CLINICAL DATA Spotting and pelvic cramping with history of previous ectopic pregnancy  EXAM OBSTETRIC <14 WK [place] AND TRANSVAGINAL OB US  TECHNIQUE Both transabdominal and transvaginal ultrasound examinations were performed for complete evaluation of the gestation as well as the maternal uterus, adnexal regions, and pelvic cul-de-sac. Transvaginal technique was performed to assess early pregnancy.  COMPARISON None.  FINDINGS Intrauterine gestational sac: Visualized/normal in shape.  Yolk sac:  Present  Embryo:  Present  Cardiac Activity: Present.  Heart Rate:  118 bpm  MSD:  18.7  mm   8 w   3  d  CRL:   2.01  mm   8 w 4 d                  [place] [facility]: December 11, 2013  Maternal uterus/adnexae: The maternal right ovary is normal in appearance. The maternal left ovary is normal in size and exhibits small follicles. In addition there is a a subtle area of decreased echogenicity measuring 2 cm in maximal dimension. Which is  not hypervascular.  IMPRESSION 1. There is a viable IUP with estimated gestational age of [redacted] weeks 4 days with estimated date of confinement approximately December 11, 2013. 2. The maternal right ovary is normal in appearance. The maternal left ovary is likely normal as well, but there is a subtle area of decreased echogenicity within the substance of the left ovary measuring 2 cm in maximal dimension. 3. Neither adnexal region demonstrates a hypervascular complex structure to suggest an ectopic pregnancy.  SIGNATURE  Electronically Signed   By: David  [place]   On: 05/04/2013 15:50     EKG Interpretation None      MDM   Final diagnoses:  Pregnancy  Bacterial vaginosis  Patient is well appearing here, no clinical signs of dehydration. Able to take PO in ED. Records indicate her ABO is A+. Her pelvic exam shows closed os, mild discharge. No new sexual contacts, do not feel she needs repeat STD testing at this time. Fetus appears normal on u/s, is IUP. No UTI. Feel she is stable for discharge. Will recommend prenatal vitamins, starting her flagyl, and f/u with OB    Ephraim Hamburger, MD 05/05/13 1009

## 2013-05-04 NOTE — Discharge Instructions (Signed)
Your Pregnancy is 8 weeks and 4 days today. Your Due date is December 11, 2013. Be sure to take Prenatal Vitamins and follow up with OB/GYN. If you have any concerning signs or symptoms return to the ER or see your Obstetrician.

## 2013-05-04 NOTE — ED Notes (Signed)
Pt states that she took a home pregnancy test about a month ago and it was positive.  Pt unsure exactly when she took the pregnancy test and also is unsure of her last period "I don't know, like in January or something like that",  When pt asked if she has attempted to get an appt with the health dept or tried to get any prenatal care, pt states "she hasn't had time cause she just got off work".  When asked what we could do for her, pt stated "I don't know, I don't know anything about it.  I don't know how far along I am and y'all need to do something about this nausea.  I can't deal with that now".

## 2013-05-05 LAB — URINE CULTURE
COLONY COUNT: NO GROWTH
Culture: NO GROWTH

## 2013-06-08 ENCOUNTER — Encounter: Payer: Self-pay | Admitting: Advanced Practice Midwife

## 2013-06-08 ENCOUNTER — Ambulatory Visit (INDEPENDENT_AMBULATORY_CARE_PROVIDER_SITE_OTHER): Payer: No Typology Code available for payment source | Admitting: Advanced Practice Midwife

## 2013-06-08 VITALS — BP 105/67 | Temp 99.2°F | Wt 119.7 lb

## 2013-06-08 DIAGNOSIS — L309 Dermatitis, unspecified: Secondary | ICD-10-CM

## 2013-06-08 DIAGNOSIS — O98519 Other viral diseases complicating pregnancy, unspecified trimester: Secondary | ICD-10-CM

## 2013-06-08 DIAGNOSIS — Z87440 Personal history of urinary (tract) infections: Secondary | ICD-10-CM

## 2013-06-08 DIAGNOSIS — Q605 Renal hypoplasia, unspecified: Secondary | ICD-10-CM

## 2013-06-08 DIAGNOSIS — L259 Unspecified contact dermatitis, unspecified cause: Secondary | ICD-10-CM

## 2013-06-08 DIAGNOSIS — Q600 Renal agenesis, unilateral: Secondary | ICD-10-CM

## 2013-06-08 DIAGNOSIS — B009 Herpesviral infection, unspecified: Secondary | ICD-10-CM

## 2013-06-08 DIAGNOSIS — IMO0002 Reserved for concepts with insufficient information to code with codable children: Secondary | ICD-10-CM

## 2013-06-08 DIAGNOSIS — Q602 Renal agenesis, unspecified: Secondary | ICD-10-CM

## 2013-06-08 DIAGNOSIS — Z3480 Encounter for supervision of other normal pregnancy, unspecified trimester: Secondary | ICD-10-CM | POA: Insufficient documentation

## 2013-06-08 DIAGNOSIS — Z87448 Personal history of other diseases of urinary system: Secondary | ICD-10-CM | POA: Insufficient documentation

## 2013-06-08 HISTORY — DX: Personal history of other diseases of urinary system: Z87.448

## 2013-06-08 HISTORY — DX: Herpesviral infection, unspecified: B00.9

## 2013-06-08 LAB — POCT URINALYSIS DIP (DEVICE)
Bilirubin Urine: NEGATIVE
Glucose, UA: NEGATIVE mg/dL
HGB URINE DIPSTICK: NEGATIVE
Ketones, ur: NEGATIVE mg/dL
NITRITE: NEGATIVE
Protein, ur: 30 mg/dL — AB
SPECIFIC GRAVITY, URINE: 1.025 (ref 1.005–1.030)
UROBILINOGEN UA: 1 mg/dL (ref 0.0–1.0)
pH: 6 (ref 5.0–8.0)

## 2013-06-08 NOTE — Progress Notes (Signed)
Pulse- 97 Patient reports occasional side/abdominal pain and lower back pain; had BV recently but did not finish medication

## 2013-06-08 NOTE — Progress Notes (Signed)
   Subjective:    Connie Wells is a [id number] [redacted]w[redacted]d being seen today for her first obstetrical visit.  Her obstetrical history is significant for NSVD x1 at 40 weeks, ectopic pregnancy x1, and TAB x1. Patient does intend to breast feed. Pregnancy history fully reviewed.  Patient reports rash on upper back with onset a few weeks ago.  Filed Vitals:   06/08/13 1455  BP: 105/67  Temp: 99.2 F (37.3 C)  Weight: 119 lb 11.2 oz (54.296 kg)    HISTORY: OB History  Gravida Para Term Preterm AB SAB TAB Ectopic Multiple Living  4 1 1  0 2 0 1 1 0 1    # Outcome Date GA Lbr Len/2nd Weight Sex Delivery Anes PTL Lv  4 CUR           3 TAB 2014          2 ECT 2013          1 TRM 08/21/08 [redacted]w[redacted]d  7 lb 6 oz (3.345 kg) F SVD None  Y     Past Medical History  Diagnosis Date  . Chronic kidney disease   . Herpes genitalia    Past Surgical History  Procedure Laterality Date  . Ectopic pregnancy surgery    . Dilation and evacuation     Family History  Problem Relation Age of Onset  . Diabetes Mother   . Hypertension Mother   . Diabetes Maternal Aunt      Exam    Uterus:     Pelvic Exam:    Perineum: No Hemorrhoids, Normal Perineum   Vulva: normal   Vagina:  normal mucosa, normal discharge   pH:    Cervix: multiparous appearance, no bleeding following Pap and no cervical motion tenderness   Adnexa: normal adnexa and no mass, fullness, tenderness   Bony Pelvis: average  System: Breast:  normal appearance, no masses or tenderness   Skin: normal coloration and turgor, no rashes    Neurologic: oriented, normal, normal mood   Extremities: normal strength, tone, and muscle mass, ROM of all joints is normal   HEENT neck supple with midline trachea and thyroid without masses   Mouth/Teeth mucous membranes moist, pharynx normal without lesions and dental hygiene good   Neck supple and no masses   Cardiovascular: regular rate and rhythm   Respiratory:  appears well, vitals  normal, no respiratory distress, acyanotic, normal RR, ear and throat exam is normal, neck free of mass or lymphadenopathy, chest clear, no wheezing, crepitations, rhonchi, normal symmetric air entry   Abdomen: soft, non-tender; bowel sounds normal; no masses,  no organomegaly   Urinary: urethral meatus normal      Assessment:    Pregnancy: [id number] Patient Active Problem List   Diagnosis Date Noted  . Hx of pyelonephritis 06/08/2013  . Supervision of normal subsequent pregnancy 06/08/2013  . SOLITARY KIDNEY 05/28/2006  . ANA POSITIVE, HX OF 05/02/2006        Plan:     Initial labs drawn. Prenatal vitamins. Problem list reviewed and updated. Genetic Screening discussed Quad Screen: declined.  Ultrasound discussed; fetal survey: requested. Consult Dr Roselie Awkward, baseline 24 hour urine and CMP r/t solitary kidney  Follow up in 4 weeks. 50% of 30 min visit spent on counseling and coordination of care.     Kathie Dike Leftwich-Kirby 06/08/2013

## 2013-06-09 LAB — OBSTETRIC PANEL
ANTIBODY SCREEN: NEGATIVE
BASOS ABS: 0.1 10*3/uL (ref 0.0–0.1)
Basophils Relative: 1 % (ref 0–1)
Eosinophils Absolute: 0.2 10*3/uL (ref 0.0–0.7)
Eosinophils Relative: 2 % (ref 0–5)
HEMATOCRIT: 33.8 % — AB (ref 36.0–46.0)
Hemoglobin: 12.1 g/dL (ref 12.0–15.0)
Hepatitis B Surface Ag: NEGATIVE
LYMPHS PCT: 22 % (ref 12–46)
Lymphs Abs: 1.8 10*3/uL (ref 0.7–4.0)
MCH: 33 pg (ref 26.0–34.0)
MCHC: 35.8 g/dL (ref 30.0–36.0)
MCV: 92.1 fL (ref 78.0–100.0)
MONO ABS: 0.6 10*3/uL (ref 0.1–1.0)
MONOS PCT: 7 % (ref 3–12)
Neutro Abs: 5.6 10*3/uL (ref 1.7–7.7)
Neutrophils Relative %: 68 % (ref 43–77)
Platelets: 249 10*3/uL (ref 150–400)
RBC: 3.67 MIL/uL — ABNORMAL LOW (ref 3.87–5.11)
RDW: 13.4 % (ref 11.5–15.5)
RH TYPE: POSITIVE
RUBELLA: 5.5 {index} — AB (ref <0.90)
WBC: 8.2 10*3/uL (ref 4.0–10.5)

## 2013-06-09 LAB — WET PREP, GENITAL: TRICH WET PREP: NONE SEEN

## 2013-06-09 LAB — GLUCOSE TOLERANCE, 1 HOUR (50G) W/O FASTING: Glucose, 1 Hour GTT: 82 mg/dL (ref 70–140)

## 2013-06-09 LAB — HIV ANTIBODY (ROUTINE TESTING W REFLEX): HIV 1&2 Ab, 4th Generation: NONREACTIVE

## 2013-06-10 LAB — HEMOGLOBINOPATHY EVALUATION
Hemoglobin Other: 0 %
Hgb A2 Quant: 2.5 % (ref 2.2–3.2)
Hgb A: 97.5 % (ref 96.8–97.8)
Hgb F Quant: 0 % (ref 0.0–2.0)
Hgb S Quant: 0 %

## 2013-06-10 LAB — CANNABANOIDS (GC/LC/MS), URINE: THC-COOH (GC/LC/MS), ur confirm: >1000 ng/mL — AB

## 2013-06-10 LAB — CULTURE, OB URINE
Colony Count: NO GROWTH
Organism ID, Bacteria: NO GROWTH

## 2013-06-11 LAB — PRESCRIPTION MONITORING PROFILE (19 PANEL)
Amphetamine/Meth: NEGATIVE ng/mL
Barbiturate Screen, Urine: NEGATIVE ng/mL
Benzodiazepine Screen, Urine: NEGATIVE ng/mL
Buprenorphine, Urine: NEGATIVE ng/mL
CARISOPRODOL, URINE: NEGATIVE ng/mL
COCAINE METABOLITES: NEGATIVE ng/mL
CREATININE, URINE: 245.49 mg/dL (ref >20.0)
Fentanyl, Ur: NEGATIVE ng/mL
MDMA URINE: NEGATIVE ng/mL
MEPERIDINE UR: NEGATIVE ng/mL
METHADONE SCREEN, URINE: NEGATIVE ng/mL
Methaqualone: NEGATIVE ng/mL
NITRITES URINE, INITIAL: NEGATIVE ug/mL
OXYCODONE SCRN UR: NEGATIVE ng/mL
Opiate Screen, Urine: NEGATIVE ng/mL
PH URINE, INITIAL: 6.2 pH (ref 4.5–8.9)
PROPOXYPHENE: NEGATIVE ng/mL
Phencyclidine, Ur: NEGATIVE ng/mL
TRAMADOL UR: NEGATIVE ng/mL
Tapentadol, urine: NEGATIVE ng/mL
Zolpidem, Urine: NEGATIVE ng/mL

## 2013-06-30 ENCOUNTER — Inpatient Hospital Stay (HOSPITAL_COMMUNITY)
Admission: AD | Admit: 2013-06-30 | Discharge: 2013-06-30 | Disposition: A | Discharge status: Home / Self Care | Payer: No Typology Code available for payment source | Source: Ambulatory Visit | Attending: Obstetrics & Gynecology | Admitting: Obstetrics & Gynecology

## 2013-06-30 NOTE — MAU Note (Signed)
Pt not in the lobby 

## 2013-06-30 NOTE — MAU Note (Signed)
Pt not in lobby.  

## 2013-07-06 ENCOUNTER — Ambulatory Visit (INDEPENDENT_AMBULATORY_CARE_PROVIDER_SITE_OTHER): Payer: No Typology Code available for payment source | Admitting: Obstetrics and Gynecology

## 2013-07-06 ENCOUNTER — Encounter: Payer: Self-pay | Admitting: Obstetrics and Gynecology

## 2013-07-06 VITALS — BP 97/56 | HR 92 | Temp 97.6°F | Wt 127.4 lb

## 2013-07-06 DIAGNOSIS — Z3689 Encounter for other specified antenatal screening: Secondary | ICD-10-CM

## 2013-07-06 DIAGNOSIS — O99322 Drug use complicating pregnancy, second trimester: Secondary | ICD-10-CM | POA: Insufficient documentation

## 2013-07-06 DIAGNOSIS — B9689 Other specified bacterial agents as the cause of diseases classified elsewhere: Secondary | ICD-10-CM

## 2013-07-06 DIAGNOSIS — N760 Acute vaginitis: Secondary | ICD-10-CM

## 2013-07-06 DIAGNOSIS — F1910 Other psychoactive substance abuse, uncomplicated: Secondary | ICD-10-CM

## 2013-07-06 DIAGNOSIS — O99340 Other mental disorders complicating pregnancy, unspecified trimester: Secondary | ICD-10-CM

## 2013-07-06 DIAGNOSIS — A499 Bacterial infection, unspecified: Secondary | ICD-10-CM

## 2013-07-06 HISTORY — DX: Drug use complicating pregnancy, second trimester: O99.322

## 2013-07-06 MED ORDER — METRONIDAZOLE 250 MG PO TABS: 500.0000 mg | ORAL_TABLET | Freq: Two times a day (BID) | ORAL | Status: AC

## 2013-07-06 NOTE — Progress Notes (Signed)
Vaginal discharge- creamy with an odor

## 2013-07-06 NOTE — Progress Notes (Signed)
Refuses to do 24 hr urine because she is either at work or "out and about." Hx pyelo x2 lifetime. Has single kidney> will get Chem 24. If creatinine elevated> to [facility]. Declines WP and has symptoms of recurrent BV - thin grey discharge with fishy odor. May not have taken complete course of Flagyl a month ago> Rx Flagyl. Recommend no sex until TOC. Partner agrees.  Advised marijuana pos> will stop and have UDS in 6 wks.

## 2013-07-06 NOTE — Patient Instructions (Signed)
Second Trimester of Pregnancy The second trimester is from week 13 through week 28, months 4 through 6. The second trimester is often a time when you feel your best. Your body has also adjusted to being pregnant, and you begin to feel better physically. Usually, morning sickness has lessened or quit completely, you may have more energy, and you may have an increase in appetite. The second trimester is also a time when the fetus is growing rapidly. At the end of the sixth month, the fetus is about 9 inches long and weighs about 1 pounds. You will likely begin to feel the baby move (quickening) between 18 and 20 weeks of the pregnancy. BODY CHANGES Your body goes through many changes during pregnancy. The changes vary from woman to woman.   Your weight will continue to increase. You will notice your lower abdomen bulging out.  You may begin to get stretch marks on your hips, abdomen, and breasts.  You may develop headaches that can be relieved by medicines approved by your caregiver.  You may urinate more often because the fetus is pressing on your bladder.  You may develop or continue to have heartburn as a result of your pregnancy.  You may develop constipation because certain hormones are causing the muscles that push waste through your intestines to slow down.  You may develop hemorrhoids or swollen, bulging veins (varicose veins).  You may have back pain because of the weight gain and pregnancy hormones relaxing your joints between the bones in your pelvis and as a result of a shift in weight and the muscles that support your balance.  Your breasts will continue to grow and be tender.  Your gums may bleed and may be sensitive to brushing and flossing.  Dark spots or blotches (chloasma, mask of pregnancy) may develop on your face. This will likely fade after the baby is born.  A dark line from your belly button to the pubic area (linea nigra) may appear. This will likely fade after the  baby is born. WHAT TO EXPECT AT YOUR PRENATAL VISITS During a routine prenatal visit:  You will be weighed to make sure you and the fetus are growing normally.  Your blood pressure will be taken.  Your abdomen will be measured to track your baby's growth.  The fetal heartbeat will be listened to.  Any test results from the previous visit will be discussed. Your caregiver may ask you:  How you are feeling.  If you are feeling the baby move.  If you have had any abnormal symptoms, such as leaking fluid, bleeding, severe headaches, or abdominal cramping.  If you have any questions. Other tests that may be performed during your second trimester include:  Blood tests that check for:  Low iron levels (anemia).  Gestational diabetes (between 24 and 28 weeks).  Rh antibodies.  Urine tests to check for infections, diabetes, or protein in the urine.  An ultrasound to confirm the proper growth and development of the baby.  An amniocentesis to check for possible genetic problems.  Fetal screens for spina bifida and Down syndrome. HOME CARE INSTRUCTIONS   Avoid all smoking, herbs, alcohol, and unprescribed drugs. These chemicals affect the formation and growth of the baby.  Follow your caregiver's instructions regarding medicine use. There are medicines that are either safe or unsafe to take during pregnancy.  Exercise only as directed by your caregiver. Experiencing uterine cramps is a good sign to stop exercising.  Continue to eat regular,   healthy meals.  Wear a good support bra for breast tenderness.  Do not use hot tubs, steam rooms, or saunas.  Wear your seat belt at all times when driving.  Avoid raw meat, uncooked cheese, cat litter boxes, and soil used by cats. These carry germs that can cause birth defects in the baby.  Take your prenatal vitamins.  Try taking a stool softener (if your caregiver approves) if you develop constipation. Eat more high-fiber foods,  such as fresh vegetables or fruit and whole grains. Drink plenty of fluids to keep your urine clear or pale yellow.  Take warm sitz baths to soothe any pain or discomfort caused by hemorrhoids. Use hemorrhoid cream if your caregiver approves.  If you develop varicose veins, wear support hose. Elevate your feet for 15 minutes, 3 4 times a day. Limit salt in your diet.  Avoid heavy lifting, wear low heel shoes, and practice good posture.  Rest with your legs elevated if you have leg cramps or low back pain.  Visit your dentist if you have not gone yet during your pregnancy. Use a soft toothbrush to brush your teeth and be gentle when you floss.  A sexual relationship may be continued unless your caregiver directs you otherwise.  Continue to go to all your prenatal visits as directed by your caregiver. SEEK MEDICAL CARE IF:   You have dizziness.  You have mild pelvic cramps, pelvic pressure, or nagging pain in the abdominal area.  You have persistent nausea, vomiting, or diarrhea.  You have a bad smelling vaginal discharge.  You have pain with urination. SEEK IMMEDIATE MEDICAL CARE IF:   You have a fever.  You are leaking fluid from your vagina.  You have spotting or bleeding from your vagina.  You have severe abdominal cramping or pain.  You have rapid weight gain or loss.  You have shortness of breath with chest pain.  You notice sudden or extreme swelling of your face, hands, ankles, feet, or legs.  You have not felt your baby move in over an hour.  You have severe headaches that do not go away with medicine.  You have vision changes. Document Released: 02/05/2001 Document Revised: 10/14/2012 Document Reviewed: 04/14/2012 [employer] Patient Information 2014 [employer].  

## 2013-07-07 LAB — COMPREHENSIVE METABOLIC PANEL
ALBUMIN: 3.7 g/dL (ref 3.5–5.2)
ALK PHOS: 33 U/L — AB (ref 39–117)
ALT: 8 U/L (ref 0–35)
AST: 15 U/L (ref 0–37)
BILIRUBIN TOTAL: 0.2 mg/dL (ref 0.2–1.2)
BUN: 10 mg/dL (ref 6–23)
CO2: 25 meq/L (ref 19–32)
Calcium: 9.6 mg/dL (ref 8.4–10.5)
Chloride: 100 mEq/L (ref 96–112)
Creat: 0.58 mg/dL (ref 0.50–1.10)
GLUCOSE: 86 mg/dL (ref 70–99)
POTASSIUM: 4.8 meq/L (ref 3.5–5.3)
SODIUM: 134 meq/L — AB (ref 135–145)
TOTAL PROTEIN: 6.7 g/dL (ref 6.0–8.3)

## 2013-07-22 ENCOUNTER — Ambulatory Visit (HOSPITAL_COMMUNITY)
Admission: RE | Admit: 2013-07-22 | Discharge: 2013-07-22 | Disposition: A | Discharge status: Home / Self Care | Payer: No Typology Code available for payment source | Source: Ambulatory Visit | Attending: Obstetrics and Gynecology | Admitting: Obstetrics and Gynecology

## 2013-07-22 DIAGNOSIS — Z3482 Encounter for supervision of other normal pregnancy, second trimester: Secondary | ICD-10-CM

## 2013-07-22 DIAGNOSIS — Z3689 Encounter for other specified antenatal screening: Secondary | ICD-10-CM | POA: Insufficient documentation

## 2013-08-03 ENCOUNTER — Encounter: Payer: No Typology Code available for payment source | Admitting: Advanced Practice Midwife

## 2013-08-03 ENCOUNTER — Telehealth: Payer: Self-pay | Admitting: Family Medicine

## 2013-08-03 NOTE — Telephone Encounter (Signed)
Will sent letter. Patient phone off.

## 2013-10-06 ENCOUNTER — Ambulatory Visit (INDEPENDENT_AMBULATORY_CARE_PROVIDER_SITE_OTHER): Payer: Self-pay | Admitting: Physician Assistant

## 2013-10-06 ENCOUNTER — Encounter: Payer: Self-pay | Admitting: Advanced Practice Midwife

## 2013-10-06 VITALS — BP 110/60 | HR 90 | Wt 143.7 lb

## 2013-10-06 DIAGNOSIS — Z3480 Encounter for supervision of other normal pregnancy, unspecified trimester: Secondary | ICD-10-CM

## 2013-10-06 DIAGNOSIS — O9989 Other specified diseases and conditions complicating pregnancy, childbirth and the puerperium: Secondary | ICD-10-CM

## 2013-10-06 DIAGNOSIS — O26893 Other specified pregnancy related conditions, third trimester: Secondary | ICD-10-CM

## 2013-10-06 DIAGNOSIS — F1910 Other psychoactive substance abuse, uncomplicated: Secondary | ICD-10-CM

## 2013-10-06 DIAGNOSIS — Z3483 Encounter for supervision of other normal pregnancy, third trimester: Secondary | ICD-10-CM

## 2013-10-06 DIAGNOSIS — N898 Other specified noninflammatory disorders of vagina: Secondary | ICD-10-CM

## 2013-10-06 DIAGNOSIS — O99340 Other mental disorders complicating pregnancy, unspecified trimester: Secondary | ICD-10-CM

## 2013-10-06 DIAGNOSIS — R319 Hematuria, unspecified: Secondary | ICD-10-CM

## 2013-10-06 DIAGNOSIS — O99322 Drug use complicating pregnancy, second trimester: Secondary | ICD-10-CM

## 2013-10-06 LAB — POCT URINALYSIS DIP (DEVICE)
Bilirubin Urine: NEGATIVE
Glucose, UA: NEGATIVE mg/dL
Hgb urine dipstick: NEGATIVE
Ketones, ur: NEGATIVE mg/dL
NITRITE: NEGATIVE
Protein, ur: 30 mg/dL — AB
Specific Gravity, Urine: 1.015 (ref 1.005–1.030)
UROBILINOGEN UA: 1 mg/dL (ref 0.0–1.0)
pH: 7 (ref 5.0–8.0)

## 2013-10-06 LAB — CBC
HCT: 28.8 % — ABNORMAL LOW (ref 36.0–46.0)
Hemoglobin: 9.7 g/dL — ABNORMAL LOW (ref 12.0–15.0)
MCH: 31.7 pg (ref 26.0–34.0)
MCHC: 33.7 g/dL (ref 30.0–36.0)
MCV: 94.1 fL (ref 78.0–100.0)
PLATELETS: 180 10*3/uL (ref 150–400)
RBC: 3.06 MIL/uL — ABNORMAL LOW (ref 3.87–5.11)
RDW: 14 % (ref 11.5–15.5)
WBC: 8.9 10*3/uL (ref 4.0–10.5)

## 2013-10-06 NOTE — Progress Notes (Signed)
28 week labs today.

## 2013-10-06 NOTE — Progress Notes (Signed)
30 week IUP complaining of discharge with odor, requests eval for this She denies any marijuana/recreational drug use in the last 2 weeks. She denies all urinary symptoms.  H/o solitary kidney Assessment: 30 weeks, measuring well, hematuria without symptoms Plan: GTT today GC/Chlamydia/wet prep today - pending results Urine culture RTC 2 weeks

## 2013-10-07 LAB — WET PREP, GENITAL
TRICH WET PREP: NONE SEEN
Yeast Wet Prep HPF POC: NONE SEEN

## 2013-10-07 LAB — HIV ANTIBODY (ROUTINE TESTING W REFLEX): HIV: NONREACTIVE

## 2013-10-07 LAB — GLUCOSE TOLERANCE, 1 HOUR (50G) W/O FASTING: GLUCOSE 1 HOUR GTT: 110 mg/dL (ref 70–140)

## 2013-10-07 LAB — GC/CHLAMYDIA PROBE AMP
CT PROBE, AMP APTIMA: NEGATIVE
GC Probe RNA: NEGATIVE

## 2013-10-07 LAB — RPR

## 2013-10-08 LAB — CULTURE, OB URINE

## 2013-10-11 ENCOUNTER — Other Ambulatory Visit: Payer: Self-pay | Admitting: Advanced Practice Midwife

## 2013-10-11 NOTE — Progress Notes (Unsigned)
GBS bacteriuria per screening Urine Culture. Rx PCN.

## 2013-10-14 ENCOUNTER — Telehealth: Payer: Self-pay | Admitting: *Deleted

## 2013-10-14 ENCOUNTER — Other Ambulatory Visit: Payer: Self-pay | Admitting: Advanced Practice Midwife

## 2013-10-14 DIAGNOSIS — R8271 Bacteriuria: Secondary | ICD-10-CM | POA: Insufficient documentation

## 2013-10-14 DIAGNOSIS — Z3483 Encounter for supervision of other normal pregnancy, third trimester: Secondary | ICD-10-CM

## 2013-10-14 MED ORDER — AMPICILLIN 500 MG PO CAPS: 500.0000 mg | ORAL_CAPSULE | Freq: Four times a day (QID) | ORAL | Status: DC

## 2013-10-14 NOTE — Progress Notes (Signed)
Called Connie Wells and notified her of UTI, needing treatment We discussed important to take QID until all tabs gone.  She states before didn't take it correctly. I emphasized to do her best at taking it QID, but important to finish taking all pills even if is more than 7 days and that we would recheck urine at her next visits. Resent prescription to pharmacy as it was printed and she did not receive it.

## 2013-10-14 NOTE — Progress Notes (Signed)
GBS UTI. Rx Amp.

## 2013-10-14 NOTE — Addendum Note (Signed)
Addended by: Samuel Germany on: 10/14/2013 04:02 PM   Modules accepted: Orders

## 2013-10-14 NOTE — Telephone Encounter (Signed)
Opened in error

## 2013-10-26 ENCOUNTER — Ambulatory Visit (INDEPENDENT_AMBULATORY_CARE_PROVIDER_SITE_OTHER): Payer: Medicaid Other | Admitting: Advanced Practice Midwife

## 2013-10-26 ENCOUNTER — Encounter: Payer: Self-pay | Admitting: Advanced Practice Midwife

## 2013-10-26 VITALS — BP 102/55 | HR 83 | Temp 98.2°F | Wt 143.4 lb

## 2013-10-26 DIAGNOSIS — F1210 Cannabis abuse, uncomplicated: Secondary | ICD-10-CM

## 2013-10-26 DIAGNOSIS — Z3480 Encounter for supervision of other normal pregnancy, unspecified trimester: Secondary | ICD-10-CM

## 2013-10-26 DIAGNOSIS — Z3483 Encounter for supervision of other normal pregnancy, third trimester: Secondary | ICD-10-CM

## 2013-10-26 DIAGNOSIS — W19XXXA Unspecified fall, initial encounter: Secondary | ICD-10-CM

## 2013-10-26 LAB — POCT URINALYSIS DIP (DEVICE)
BILIRUBIN URINE: NEGATIVE
GLUCOSE, UA: NEGATIVE mg/dL
HGB URINE DIPSTICK: NEGATIVE
Ketones, ur: NEGATIVE mg/dL
Nitrite: NEGATIVE
PH: 5.5 (ref 5.0–8.0)
PROTEIN: 30 mg/dL — AB
SPECIFIC GRAVITY, URINE: 1.02 (ref 1.005–1.030)
Urobilinogen, UA: 0.2 mg/dL (ref 0.0–1.0)

## 2013-10-26 NOTE — Progress Notes (Signed)
Discussed Tdap with patient-- information sheet given. Patient to decide by next visit.

## 2013-10-26 NOTE — Patient Instructions (Signed)
Third Trimester of Pregnancy The third trimester is from week 29 through week 42, months 7 through 9. The third trimester is a time when the fetus is growing rapidly. At the end of the ninth month, the fetus is about 20 inches in length and weighs 6-10 pounds.  BODY CHANGES Your body goes through many changes during pregnancy. The changes vary from woman to woman.   Your weight will continue to increase. You can expect to gain 25-35 pounds (11-16 kg) by the end of the pregnancy.  You may begin to get stretch marks on your hips, abdomen, and breasts.  You may urinate more often because the fetus is moving lower into your pelvis and pressing on your bladder.  You may develop or continue to have heartburn as a result of your pregnancy.  You may develop constipation because certain hormones are causing the muscles that push waste through your intestines to slow down.  You may develop hemorrhoids or swollen, bulging veins (varicose veins).  You may have pelvic pain because of the weight gain and pregnancy hormones relaxing your joints between the bones in your pelvis. Backaches may result from overexertion of the muscles supporting your posture.  You may have changes in your hair. These can include thickening of your hair, rapid growth, and changes in texture. Some women also have hair loss during or after pregnancy, or hair that feels dry or thin. Your hair will most likely return to normal after your baby is born.  Your breasts will continue to grow and be tender. A yellow discharge may leak from your breasts called colostrum.  Your belly button may stick out.  You may feel short of breath because of your expanding uterus.  You may notice the fetus "dropping," or moving lower in your abdomen.  You may have a bloody mucus discharge. This usually occurs a few days to a week before labor begins.  Your cervix becomes thin and soft (effaced) near your due date. WHAT TO EXPECT AT YOUR PRENATAL  EXAMS  You will have prenatal exams every 2 weeks until week 36. Then, you will have weekly prenatal exams. During a routine prenatal visit:  You will be weighed to make sure you and the fetus are growing normally.  Your blood pressure is taken.  Your abdomen will be measured to track your baby's growth.  The fetal heartbeat will be listened to.  Any test results from the previous visit will be discussed.  You may have a cervical check near your due date to see if you have effaced. At around 36 weeks, your caregiver will check your cervix. At the same time, your caregiver will also perform a test on the secretions of the vaginal tissue. This test is to determine if a type of bacteria, Group B streptococcus, is present. Your caregiver will explain this further. Your caregiver may ask you:  What your birth plan is.  How you are feeling.  If you are feeling the baby move.  If you have had any abnormal symptoms, such as leaking fluid, bleeding, severe headaches, or abdominal cramping.  If you have any questions. Other tests or screenings that may be performed during your third trimester include:  Blood tests that check for low iron levels (anemia).  Fetal testing to check the health, activity level, and growth of the fetus. Testing is done if you have certain medical conditions or if there are problems during the pregnancy. FALSE LABOR You may feel small, irregular contractions that   eventually go away. These are called Braxton Hicks contractions, or false labor. Contractions may last for hours, days, or even weeks before true labor sets in. If contractions come at regular intervals, intensify, or become painful, it is best to be seen by your caregiver.  SIGNS OF LABOR   Menstrual-like cramps.  Contractions that are 5 minutes apart or less.  Contractions that start on the top of the uterus and spread down to the lower abdomen and back.  A sense of increased pelvic pressure or back  pain.  A watery or bloody mucus discharge that comes from the vagina. If you have any of these signs before the 37th week of pregnancy, call your caregiver right away. You need to go to the hospital to get checked immediately. HOME CARE INSTRUCTIONS   Avoid all smoking, herbs, alcohol, and unprescribed drugs. These chemicals affect the formation and growth of the baby.  Follow your caregiver's instructions regarding medicine use. There are medicines that are either safe or unsafe to take during pregnancy.  Exercise only as directed by your caregiver. Experiencing uterine cramps is a good sign to stop exercising.  Continue to eat regular, healthy meals.  Wear a good support bra for breast tenderness.  Do not use hot tubs, steam rooms, or saunas.  Wear your seat belt at all times when driving.  Avoid raw meat, uncooked cheese, cat litter boxes, and soil used by cats. These carry germs that can cause birth defects in the baby.  Take your prenatal vitamins.  Try taking a stool softener (if your caregiver approves) if you develop constipation. Eat more high-fiber foods, such as fresh vegetables or fruit and whole grains. Drink plenty of fluids to keep your urine clear or pale yellow.  Take warm sitz baths to soothe any pain or discomfort caused by hemorrhoids. Use hemorrhoid cream if your caregiver approves.  If you develop varicose veins, wear support hose. Elevate your feet for 15 minutes, 3-4 times a day. Limit salt in your diet.  Avoid heavy lifting, wear low heal shoes, and practice good posture.  Rest a lot with your legs elevated if you have leg cramps or low back pain.  Visit your dentist if you have not gone during your pregnancy. Use a soft toothbrush to brush your teeth and be gentle when you floss.  A sexual relationship may be continued unless your caregiver directs you otherwise.  Do not travel far distances unless it is absolutely necessary and only with the approval  of your caregiver.  Take prenatal classes to understand, practice, and ask questions about the labor and delivery.  Make a trial run to the hospital.  Pack your hospital bag.  Prepare the baby's nursery.  Continue to go to all your prenatal visits as directed by your caregiver. SEEK MEDICAL CARE IF:  You are unsure if you are in labor or if your water has broken.  You have dizziness.  You have mild pelvic cramps, pelvic pressure, or nagging pain in your abdominal area.  You have persistent nausea, vomiting, or diarrhea.  You have a bad smelling vaginal discharge.  You have pain with urination. SEEK IMMEDIATE MEDICAL CARE IF:   You have a fever.  You are leaking fluid from your vagina.  You have spotting or bleeding from your vagina.  You have severe abdominal cramping or pain.  You have rapid weight loss or gain.  You have shortness of breath with chest pain.  You notice sudden or extreme swelling   of your face, hands, ankles, feet, or legs.  You have not felt your baby move in over an hour.  You have severe headaches that do not go away with medicine.  You have vision changes. Document Released: 02/05/2001 Document Revised: 02/16/2013 Document Reviewed: 04/14/2012 Exit[employer] Patient Information 2015 [employer]. This information is not intended to replace advice given to you by your health care provider. Make sure you discuss any questions you have with your health care provider.  

## 2013-10-26 NOTE — Progress Notes (Signed)
Fainted yesterday. Did not eat in the morning. Reviewed eating every 3-4 hrs.   Pt states has had sharp pains in lower abdomen last night and this morning. Concerned about baby. Has had normal fetal movement. Will check [place] for placental abruption.

## 2013-10-27 ENCOUNTER — Ambulatory Visit (HOSPITAL_COMMUNITY)
Admission: RE | Admit: 2013-10-27 | Discharge: 2013-10-27 | Disposition: A | Discharge status: Home / Self Care | Payer: Medicaid Other | Source: Ambulatory Visit | Attending: Advanced Practice Midwife | Admitting: Advanced Practice Midwife

## 2013-10-27 DIAGNOSIS — O99891 Other specified diseases and conditions complicating pregnancy: Secondary | ICD-10-CM | POA: Diagnosis not present

## 2013-10-27 DIAGNOSIS — R109 Unspecified abdominal pain: Secondary | ICD-10-CM | POA: Diagnosis not present

## 2013-10-27 DIAGNOSIS — IMO0002 Reserved for concepts with insufficient information to code with codable children: Secondary | ICD-10-CM

## 2013-10-27 DIAGNOSIS — W19XXXA Unspecified fall, initial encounter: Secondary | ICD-10-CM

## 2013-10-27 DIAGNOSIS — O9989 Other specified diseases and conditions complicating pregnancy, childbirth and the puerperium: Principal | ICD-10-CM

## 2013-10-27 DIAGNOSIS — T1490XA Injury, unspecified, initial encounter: Secondary | ICD-10-CM

## 2013-10-28 LAB — CANNABANOIDS (GC/LC/MS), URINE: THC-COOH (GC/LC/MS), ur confirm: 1418 ng/mL — AB (ref <5)

## 2013-10-29 ENCOUNTER — Encounter: Payer: Self-pay | Admitting: Advanced Practice Midwife

## 2013-10-29 LAB — PRESCRIPTION MONITORING PROFILE (19 PANEL)
AMPHETAMINE/METH: NEGATIVE ng/mL
BARBITURATE SCREEN, URINE: NEGATIVE ng/mL
BENZODIAZEPINE SCREEN, URINE: NEGATIVE ng/mL
Buprenorphine, Urine: NEGATIVE ng/mL
COCAINE METABOLITES: NEGATIVE ng/mL
CREATININE, URINE: 203.88 mg/dL (ref >20.0)
Carisoprodol, Urine: NEGATIVE ng/mL
Fentanyl, Ur: NEGATIVE ng/mL
MDMA URINE: NEGATIVE ng/mL
Meperidine, Ur: NEGATIVE ng/mL
Methadone Screen, Urine: NEGATIVE ng/mL
Methaqualone: NEGATIVE ng/mL
NITRITES URINE, INITIAL: NEGATIVE ug/mL
OPIATE SCREEN, URINE: NEGATIVE ng/mL
OXYCODONE SCRN UR: NEGATIVE ng/mL
PH URINE, INITIAL: 6.2 pH (ref 4.5–8.9)
PROPOXYPHENE: NEGATIVE ng/mL
Phencyclidine, Ur: NEGATIVE ng/mL
TRAMADOL UR: NEGATIVE ng/mL
Tapentadol, urine: NEGATIVE ng/mL
Zolpidem, Urine: NEGATIVE ng/mL

## 2013-11-04 ENCOUNTER — Encounter: Payer: Self-pay | Admitting: Advanced Practice Midwife

## 2013-11-05 ENCOUNTER — Encounter (HOSPITAL_COMMUNITY): Payer: Self-pay

## 2013-11-05 ENCOUNTER — Inpatient Hospital Stay (HOSPITAL_COMMUNITY)
Admission: AD | Admit: 2013-11-05 | Discharge: 2013-11-11 | DRG: 781 | Disposition: A | Discharge status: Home / Self Care | Payer: Medicaid Other | Source: Ambulatory Visit | Attending: Obstetrics and Gynecology | Admitting: Obstetrics and Gynecology

## 2013-11-05 ENCOUNTER — Inpatient Hospital Stay (HOSPITAL_COMMUNITY): Payer: Medicaid Other

## 2013-11-05 DIAGNOSIS — N189 Chronic kidney disease, unspecified: Secondary | ICD-10-CM | POA: Diagnosis present

## 2013-11-05 DIAGNOSIS — Z87891 Personal history of nicotine dependence: Secondary | ICD-10-CM

## 2013-11-05 DIAGNOSIS — Z22330 Carrier of Group B streptococcus: Secondary | ICD-10-CM

## 2013-11-05 DIAGNOSIS — O99340 Other mental disorders complicating pregnancy, unspecified trimester: Secondary | ICD-10-CM | POA: Diagnosis present

## 2013-11-05 DIAGNOSIS — A6000 Herpesviral infection of urogenital system, unspecified: Secondary | ICD-10-CM | POA: Diagnosis present

## 2013-11-05 DIAGNOSIS — O9989 Other specified diseases and conditions complicating pregnancy, childbirth and the puerperium: Secondary | ICD-10-CM

## 2013-11-05 DIAGNOSIS — F1210 Cannabis abuse, uncomplicated: Secondary | ICD-10-CM | POA: Diagnosis present

## 2013-11-05 DIAGNOSIS — O321XX0 Maternal care for breech presentation, not applicable or unspecified: Secondary | ICD-10-CM | POA: Diagnosis present

## 2013-11-05 DIAGNOSIS — O4690 Antepartum hemorrhage, unspecified, unspecified trimester: Principal | ICD-10-CM

## 2013-11-05 DIAGNOSIS — O98519 Other viral diseases complicating pregnancy, unspecified trimester: Secondary | ICD-10-CM | POA: Diagnosis present

## 2013-11-05 DIAGNOSIS — Z833 Family history of diabetes mellitus: Secondary | ICD-10-CM | POA: Diagnosis not present

## 2013-11-05 DIAGNOSIS — O26839 Pregnancy related renal disease, unspecified trimester: Secondary | ICD-10-CM | POA: Diagnosis present

## 2013-11-05 DIAGNOSIS — O99891 Other specified diseases and conditions complicating pregnancy: Secondary | ICD-10-CM | POA: Diagnosis present

## 2013-11-05 DIAGNOSIS — Z8249 Family history of ischemic heart disease and other diseases of the circulatory system: Secondary | ICD-10-CM

## 2013-11-05 DIAGNOSIS — O4693 Antepartum hemorrhage, unspecified, third trimester: Secondary | ICD-10-CM | POA: Diagnosis present

## 2013-11-05 HISTORY — DX: Renal agenesis, unilateral: Q60.0

## 2013-11-05 HISTORY — DX: Reserved for concepts with insufficient information to code with codable children: IMO0002

## 2013-11-05 LAB — CBC
HCT: 27.1 % — ABNORMAL LOW (ref 36.0–46.0)
Hemoglobin: 9.4 g/dL — ABNORMAL LOW (ref 12.0–15.0)
MCH: 32.5 pg (ref 26.0–34.0)
MCHC: 34.7 g/dL (ref 30.0–36.0)
MCV: 93.8 fL (ref 78.0–100.0)
Platelets: 179 10*3/uL (ref 150–400)
RBC: 2.89 MIL/uL — ABNORMAL LOW (ref 3.87–5.11)
RDW: 13.5 % (ref 11.5–15.5)
WBC: 11 10*3/uL — ABNORMAL HIGH (ref 4.0–10.5)

## 2013-11-05 LAB — TYPE AND SCREEN
ABO/RH(D): A POS
Antibody Screen: NEGATIVE

## 2013-11-05 MED ORDER — FAMOTIDINE 20 MG PO TABS
20.0000 mg | ORAL_TABLET | Freq: Two times a day (BID) | ORAL | Status: DC
  Administered 2013-11-05 – 2013-11-06 (×2): 20 mg via ORAL
  Filled 2013-11-05 (×8): qty 1

## 2013-11-05 MED ORDER — ACETAMINOPHEN 325 MG PO TABS
650.0000 mg | ORAL_TABLET | ORAL | Status: DC | PRN
  Administered 2013-11-09: 650 mg via ORAL
  Filled 2013-11-05 (×2): qty 2

## 2013-11-05 MED ORDER — SODIUM CHLORIDE 0.9 % IJ SOLN
3.0000 mL | Freq: Two times a day (BID) | INTRAMUSCULAR | Status: DC
  Administered 2013-11-06 – 2013-11-10 (×8): 3 mL via INTRAVENOUS

## 2013-11-05 MED ORDER — DOCUSATE SODIUM 100 MG PO CAPS
100.0000 mg | ORAL_CAPSULE | Freq: Every day | ORAL | Status: DC
  Administered 2013-11-06 – 2013-11-11 (×6): 100 mg via ORAL
  Filled 2013-11-05 (×6): qty 1

## 2013-11-05 MED ORDER — CALCIUM CARBONATE ANTACID 500 MG PO CHEW: 2.0000 | CHEWABLE_TABLET | ORAL | Status: DC | PRN

## 2013-11-05 MED ORDER — PRENATAL MULTIVITAMIN CH
1.0000 | ORAL_TABLET | Freq: Every day | ORAL | Status: DC
  Administered 2013-11-06 – 2013-11-11 (×6): 1 via ORAL
  Filled 2013-11-05 (×6): qty 1

## 2013-11-05 MED ORDER — SODIUM CHLORIDE 0.9 % IJ SOLN
3.0000 mL | INTRAMUSCULAR | Status: DC | PRN
  Administered 2013-11-05: 3 mL via INTRAVENOUS

## 2013-11-05 MED ORDER — FAMOTIDINE IN NACL 20-0.9 MG/50ML-% IV SOLN
20.0000 mg | Freq: Two times a day (BID) | INTRAVENOUS | Status: DC
  Administered 2013-11-05: 20 mg via INTRAVENOUS
  Filled 2013-11-05 (×2): qty 50

## 2013-11-05 MED ORDER — ZOLPIDEM TARTRATE 5 MG PO TABS
5.0000 mg | ORAL_TABLET | Freq: Every evening | ORAL | Status: DC | PRN
  Administered 2013-11-07 – 2013-11-10 (×5): 5 mg via ORAL
  Filled 2013-11-05 (×5): qty 1

## 2013-11-05 MED ORDER — SODIUM CHLORIDE 0.9 % IV SOLN: 250.0000 mL | INTRAVENOUS | Status: DC | PRN

## 2013-11-05 NOTE — MAU Note (Signed)
Bedside u/s complete

## 2013-11-05 NOTE — Progress Notes (Signed)
UR completed 

## 2013-11-05 NOTE — H&P (Signed)
Connie Wells is a 28 y.o. female [id number] @ 34.6wks by 8wk U/S presenting for eval of sudden onset vag bleeding. Reports cramping started soon after bldg; denies visualizing clear fluid. Last IC last PM. Her preg has been followed by the [facility] and has been remarkable for 1) +GBS bacteruria 2) single kidney 3) hx pyelo x 2 lifetime 4) HSV 2 5) +THC in preg  History OB History   Grav Para Term Preterm Abortions TAB SAB Ect Mult Living   4 1 1  0 2 1 0 1 0 1     Past Medical History  Diagnosis Date  . Chronic kidney disease   . Herpes genitalia    Past Surgical History  Procedure Laterality Date  . Ectopic pregnancy surgery    . Dilation and evacuation     Family History: family history includes Diabetes in her maternal aunt and mother; Hypertension in her mother. Social History:  reports that she has quit smoking. Her smoking use included Cigarettes. She smoked 0.00 packs per day for 5 years. She has never used smokeless tobacco. She reports that she drinks alcohol. She reports that she uses illicit drugs (Marijuana).   Prenatal Transfer Tool  Maternal Diabetes: No Genetic Screening: Declined Maternal Ultrasounds/Referrals: Normal Fetal Ultrasounds or other Referrals:  None Maternal Substance Abuse:  Yes:  Type: Marijuana Significant Maternal Medications:  None Significant Maternal Lab Results:  Lab values include: Group B Strep positive Other Comments:  None  ROS    Blood pressure 111/78, pulse 78, temperature 98.1 F (36.7 C), temperature source Oral, resp. rate 20, last menstrual period 02/28/2013, SpO2 100.00%. Exam Physical Exam  Constitutional: She is oriented to person, place, and time. She appears well-developed.  HENT:  Head: Normocephalic.  Neck: Normal range of motion.  Cardiovascular: Normal rate.   Respiratory: Effort normal.  GI:  EFM 130s +accels, no decels, occ mi variables Ctx q 3-4 min, mild per pt Abd NT, gravid  Genitourinary:  SSE: active bldg  at cx, clots x 2 half dollar-sized Cx 1/50/? presentation  Musculoskeletal: Normal range of motion.  Neurological: She is alert and oriented to person, place, and time.  Skin: Skin is warm and dry.  Psychiatric: She has a normal mood and affect. Her behavior is normal. Thought content normal.    U/S: AFI 12cm, BREECH, placenta ant above cx os  CBC    Component Value Date/Time   WBC 11.0* 11/05/2013 0925   RBC 2.89* 11/05/2013 0925   HGB 9.4* 11/05/2013 0925   HCT 27.1* 11/05/2013 0925   PLT 179 11/05/2013 0925   MCV 93.8 11/05/2013 0925   MCH 32.5 11/05/2013 0925   MCHC 34.7 11/05/2013 0925   RDW 13.5 11/05/2013 0925   LYMPHSABS 1.8 06/08/2013 1601   MONOABS 0.6 06/08/2013 1601   EOSABS 0.2 06/08/2013 1601   BASOSABS 0.1 06/08/2013 1601    Prenatal labs: ABO, Rh: A/POS/-- (04/14 1601) Antibody: NEG (04/14 1601) Rubella: 5.50 (04/14 1601) RPR: NON REAC (08/12 1130)  HBsAg: NEGATIVE (04/14 1601)  HIV: NONREACTIVE (08/12 1130)  GBS:   pos (urine culture 10/06/13)  Assessment/Plan: IUP @ 34.6wks Vag bldg unk etiology but presumed abruption Breech  Admit to Antenatal Unit Continuous EFM Type & screen Plan for pt to stay in-house x 7d without bldg   Maxxon Schwanke CNM 11/05/2013, 9:20 AM

## 2013-11-05 NOTE — MAU Note (Signed)
Bedside u/s started

## 2013-11-05 NOTE — MAU Note (Signed)
Pt states began bleeding randomly around 0800. Frank red blood. Came in via EMS. Denies pain. Braxton hicks ctx's.

## 2013-11-06 ENCOUNTER — Encounter (HOSPITAL_COMMUNITY): Payer: Self-pay | Admitting: Obstetrics & Gynecology

## 2013-11-06 LAB — RAPID URINE DRUG SCREEN, HOSP PERFORMED
Amphetamines: NOT DETECTED
BENZODIAZEPINES: NOT DETECTED
Barbiturates: NOT DETECTED
COCAINE: NOT DETECTED
OPIATES: NOT DETECTED
TETRAHYDROCANNABINOL: POSITIVE — AB

## 2013-11-06 MED ORDER — ONDANSETRON 8 MG/NS 50 ML IVPB
8.0000 mg | Freq: Three times a day (TID) | INTRAVENOUS | Status: DC | PRN
  Administered 2013-11-06: 8 mg via INTRAVENOUS
  Filled 2013-11-06: qty 8

## 2013-11-06 NOTE — Progress Notes (Signed)
Had a lengthy discussion with patient and her FOB about third trimester bleeding/placental abruption and need for close observation.  Emphasized that etiology of her bleeding is currently unknown, and may not be elucidated even after observation and repeat studies/ultrasound.  Patient and the FOB are very frustrated and want to know why the bleeding occurred.  They also want to know the baby's weight. They were informed that OB U/S was ordered with MFM on 11/08/13; will follow up results and recommendations.  They are aware that another episode of bleeding will likely necessitate cesarean delivery, especially given breech presentation.  Reassuring FHR tracing. UDS pending, patient denies cocaine use.  Will continue close inpatient observation for now.     Osborne Oman, MD 11/06/2013 1:12 PM

## 2013-11-06 NOTE — Progress Notes (Signed)
Patient ID: Connie Wells, female   DOB: June 24, 1985, 28 y.o.   MRN: [id number] [place]) NOTE  Connie Wells is a 28 y.o. [id number] with Estimated Date of Delivery: 12/11/13   By  midtrimester ultrasound [redacted]w[redacted]d  who is admitted for bleeding.    Fetal presentation is breech. Length of Stay:  1  Days  Date of admission:11/05/2013  Subjective: No complaints Patient reports the fetal movement as active. Patient reports uterine contraction  activity as none. Patient reports  vaginal bleeding as none. Patient describes fluid per vagina as None.  Vitals:  Blood pressure 118/76, pulse 96, temperature 98 F (36.7 C), temperature source Oral, resp. rate 20, height 5\' 5"  (1.651 m), weight 142 lb 11.2 oz (64.728 kg), last menstrual period 02/28/2013, SpO2 100.00%. Filed Vitals:   11/05/13 1319 11/05/13 1324 11/05/13 1646 11/05/13 2018  BP:   97/47 118/76  Pulse:   76 96  Temp:   98.2 F (36.8 C) 98 F (36.7 C)  TempSrc:   Oral Oral  Resp:   18 20  Height:      Weight:      SpO2: 100% 100%     Physical Examination:   Fundal Height:  size equals dates Pelvic Exam:   Cervical Exam:  and found to be / / and fetal presentation is . Extremities:  with DTRs  Membranes:  Fetal Monitoring:  Baseline: 145 bpm, Variability: Good {> 6 bpm) and Accelerations: Reactive     Labs:  Results for orders placed during the hospital encounter of 11/05/13 (from the past 24 hour(s))  CBC   Collection Time    11/05/13  9:25 AM      Result Value Ref Range   WBC 11.0 (*) 4.0 - 10.5 K/uL   RBC 2.89 (*) 3.87 - 5.11 MIL/uL   Hemoglobin 9.4 (*) 12.0 - 15.0 g/dL   HCT 27.1 (*) 36.0 - 46.0 %   MCV 93.8  78.0 - 100.0 fL   MCH 32.5  26.0 - 34.0 pg   MCHC 34.7  30.0 - 36.0 g/dL   RDW 13.5  11.5 - 15.5 %   Platelets 179  150 - 400 K/uL  TYPE AND SCREEN   Collection Time    11/05/13  9:25 AM      Result Value Ref Range   ABO/RH(D) A POS     Antibody Screen NEG      Sample Expiration 11/08/2013      Imaging Studies:       Medications:  Scheduled . docusate sodium  100 mg Oral Daily  . famotidine  20 mg Oral BID  . prenatal multivitamin  1 tablet Oral Q1200  . sodium chloride  3 mL Intravenous Q12H   I have reviewed the patient's current medications.  ASSESSMENT: [id number] [redacted]w[redacted]d Estimated Date of Delivery: 12/11/13  Third trimester vaginal bleeding, most likely marginal sinus abruption Patient Active Problem List   Diagnosis Date Noted  . Vaginal bleeding in pregnancy 11/05/2013  . GBS bacteriuria 10/14/2013  . BV (bacterial vaginosis) 07/06/2013  . Drug use complicating pregnancy in second trimester 07/06/2013  . Hx of pyelonephritis 06/08/2013  . Supervision of normal subsequent pregnancy 06/08/2013  . HSV-2 infection complicating pregnancy 12/45/8099  . SOLITARY KIDNEY 05/28/2006  . ANA POSITIVE, HX OF 05/02/2006    PLAN: In house observation, if happens again will need caesarean delivery  Timmie Dugue H 11/06/2013,7:34 AM

## 2013-11-06 NOTE — Progress Notes (Signed)
D/W pt plan of care.... Will remain inpatient until next episode of vaginal bleeding then delivery by C/S.  Accepts plan of care.

## 2013-11-07 NOTE — Plan of Care (Signed)
Problem: Phase I Progression Outcomes Goal: Diabetic Assessment Outcome: Not Applicable Date Met:  45/14/60 Pt. Is not a diabetic.

## 2013-11-07 NOTE — Progress Notes (Signed)
Patient ID: Connie Wells, female   DOB: 08-16-1985, 28 y.o.   MRN: [id number] Connie Wells) NOTE  Connie Wells is a 28 y.o. G4P1021 at [redacted]w[redacted]d by midtrimester ultrasound who is admitted for bleeding.    Estimated Date of Delivery: 12/11/13     Fetal presentation is breech.  Length of Stay:  2  Days  Date of admission:11/05/2013  Subjective: No complaints. Markedly decreased bleeding, no bright red bleeding since admission on 11/05/13.  Patient reports the fetal movement as active. Patient reports uterine contraction  activity as none. Patient reports  vaginal bleeding as none. Patient describes fluid per vagina as None.  Vitals:   Filed Vitals:   11/06/13 0832 11/06/13 1201 11/06/13 1620 11/06/13 2000  BP: 102/53 114/59 118/61 107/52  Pulse: 76 67 75 74  Temp: 97.8 F (36.6 C) 98.2 F (36.8 C) 98.1 F (36.7 C) 98.6 F (37 C)  TempSrc: Oral Oral Oral Oral  Resp: 18 18 18 18   Height:      Weight:      SpO2:    100%   Physical Examination: General: NAD Fundal Height:  size equals dates Pelvic Exam: Deferred  Extremities:  NT, no c/c/e Membranes: Intact  Fetal Monitoring:  Baseline: 145 bpm, Variability: Moderate > 6 bpm) and Accelerations: Reactive     Labs:  Results for orders placed during the hospital encounter of 11/05/13 (from the past 24 hour(s))  URINE RAPID DRUG SCREEN (HOSP PERFORMED)   Collection Time    11/06/13 12:15 PM      Result Value Ref Range   Opiates NONE DETECTED  NONE DETECTED   Cocaine NONE DETECTED  NONE DETECTED   Benzodiazepines NONE DETECTED  NONE DETECTED   Amphetamines NONE DETECTED  NONE DETECTED   Tetrahydrocannabinol POSITIVE (*) NONE DETECTED   Barbiturates NONE DETECTED  NONE DETECTED    Imaging Studies:   11/05/13 scan negative for visible abruption or other placental anomaly   Medications:  Scheduled . docusate sodium  100 mg Oral Daily  . famotidine  20 mg Oral BID  . prenatal  multivitamin  1 tablet Oral Q1200  . sodium chloride  3 mL Intravenous Q12H   I have reviewed the patient's current medications.  ASSESSMENT: Third trimester vaginal bleeding, most likely marginal sinus abruption Estimated Date of Delivery: 12/11/13   Patient Active Problem List   Diagnosis Date Noted  . Pregnancy with third trimester bleeding 11/05/2013  . GBS bacteriuria 10/14/2013  . BV (bacterial vaginosis) 07/06/2013  . Drug use complicating pregnancy in second trimester 07/06/2013  . Hx of pyelonephritis 06/08/2013  . Supervision of normal subsequent pregnancy 06/08/2013  . HSV-2 infection complicating pregnancy 32/44/0102  . SOLITARY KIDNEY 05/28/2006  . ANA POSITIVE, HX OF 05/02/2006    PLAN: Continue in house observation Patient understands that if it happens again, she will need caesarean delivery   Osborne Oman, MD 11/07/2013,6:25 AM

## 2013-11-08 ENCOUNTER — Inpatient Hospital Stay (HOSPITAL_COMMUNITY)
Admit: 2013-11-08 | Discharge: 2013-11-08 | Disposition: A | Discharge status: Home / Self Care | Payer: Medicaid Other | Attending: Obstetrics & Gynecology | Admitting: Obstetrics & Gynecology

## 2013-11-08 DIAGNOSIS — Q605 Renal hypoplasia, unspecified: Secondary | ICD-10-CM

## 2013-11-08 DIAGNOSIS — O4693 Antepartum hemorrhage, unspecified, third trimester: Secondary | ICD-10-CM

## 2013-11-08 DIAGNOSIS — Q602 Renal agenesis, unspecified: Secondary | ICD-10-CM

## 2013-11-08 DIAGNOSIS — O99322 Drug use complicating pregnancy, second trimester: Secondary | ICD-10-CM

## 2013-11-08 LAB — CBC
HEMATOCRIT: 23.8 % — AB (ref 36.0–46.0)
HEMOGLOBIN: 8.2 g/dL — AB (ref 12.0–15.0)
MCH: 32.5 pg (ref 26.0–34.0)
MCHC: 34.5 g/dL (ref 30.0–36.0)
MCV: 94.4 fL (ref 78.0–100.0)
Platelets: 172 10*3/uL (ref 150–400)
RBC: 2.52 MIL/uL — ABNORMAL LOW (ref 3.87–5.11)
RDW: 13.8 % (ref 11.5–15.5)
WBC: 8.4 10*3/uL (ref 4.0–10.5)

## 2013-11-08 LAB — TYPE AND SCREEN
ABO/RH(D): A POS
Antibody Screen: NEGATIVE

## 2013-11-08 MED ORDER — POLYETHYLENE GLYCOL 3350 17 G PO PACK
17.0000 g | PACK | Freq: Every day | ORAL | Status: DC
  Administered 2013-11-08 – 2013-11-11 (×3): 17 g via ORAL
  Filled 2013-11-08 (×5): qty 1

## 2013-11-08 MED ORDER — LORAZEPAM 2 MG/ML IJ SOLN: 1.0000 mg | Freq: Once | INTRAMUSCULAR | Status: DC

## 2013-11-08 NOTE — Progress Notes (Signed)
Called to pt room--found pt crying and anxious--pt states IV hurts and she wants it out site WNL flushes well no redness or signs of infection noted--importance of IV discussed pt became more upset--IV removed new NSL to be placed

## 2013-11-08 NOTE — Progress Notes (Signed)
Ur chart review completed.  

## 2013-11-09 NOTE — Progress Notes (Signed)
Patient requesting to speak with the attending physician in relation to plan of care. Dr. Nehemiah Settle notified by Maryln Manuel RN.

## 2013-11-09 NOTE — Progress Notes (Signed)
Patient ID: Connie Wells, female   DOB: 02/25/1986, 28 y.o.   MRN: [id number] [place]) NOTE  Connie Wells is a 28 y.o. G4P1021 at [redacted]w[redacted]d by midtrimester ultrasound who is admitted for bleeding.    Estimated Date of Delivery: 12/11/13     Fetal presentation is cephalic as of 0/81/44 ultrasound.  Length of Stay:  4  Days  Date of admission:11/05/2013  Subjective: No complaints. Markedly decreased bleeding, no bright red bleeding since admission on 11/05/13.  Patient reports the fetal movement as active. Patient reports uterine contraction  activity as none. Patient reports  vaginal bleeding as none. Patient describes fluid per vagina as None.  Vitals:   Filed Vitals:   11/08/13 1326 11/08/13 1608 11/08/13 2006 11/09/13 0020  BP: 122/65 103/63 116/73 106/57  Pulse: 84 77 85 73  Temp:  98.4 F (36.9 C) 98.1 F (36.7 C) 97.6 F (36.4 C)  TempSrc:  Oral Oral Oral  Resp: 16 16 18 18   Height:      Weight:      SpO2:       Physical Examination: General: NAD Fundal Height:  size equals dates Pelvic Exam: Deferred  Extremities:  NT, no c/c/e Membranes: Intact  Fetal Monitoring:  Baseline: 145 bpm, Variability: Moderate > 6 bpm) and Accelerations: Reactive     Labs:  No results found for this or any previous visit (from the past 24 hour(s)).  Imaging Studies:   11/05/13 scan negative for visible abruption or other placental anomaly 11/08/13  EFW 2317g (5 lb 20z)/33%, AFI 81.85 cm, Cephalic, normal anterior placenta.   Medications:  Scheduled . docusate sodium  100 mg Oral Daily  . famotidine  20 mg Oral BID  . LORazepam  1 mg Intravenous Once  . polyethylene glycol  17 g Oral Daily  . prenatal multivitamin  1 tablet Oral Q1200  . sodium chloride  3 mL Intravenous Q12H   I have reviewed the patient's current medications.  ASSESSMENT: Third trimester vaginal bleeding, most likely marginal sinus abruption Estimated Date of  Delivery: 12/11/13   Patient Active Problem List   Diagnosis Date Noted  . Pregnancy with third trimester bleeding 11/05/2013  . GBS bacteriuria 10/14/2013  . BV (bacterial vaginosis) 07/06/2013  . Drug use complicating pregnancy in second trimester 07/06/2013  . Hx of pyelonephritis 06/08/2013  . Supervision of normal subsequent pregnancy 06/08/2013  . HSV-2 infection complicating pregnancy 63/14/9702  . SOLITARY KIDNEY 05/28/2006  . ANA POSITIVE, HX OF 05/02/2006    PLAN: Continue in house observation until 7 days without bleeding as per protocol    Osborne Oman, MD 11/09/2013,7:47 AM

## 2013-11-09 NOTE — H&P (Signed)
Attestation of Attending Supervision of Advanced Practitioner (CNM/NP): Evaluation and management procedures were performed by the Advanced Practitioner under my supervision and collaboration.  I have reviewed the Advanced Practitioner's note and chart, and I agree with the management and plan.  Loriana Samad 11/09/2013 9:26 AM

## 2013-11-09 NOTE — Progress Notes (Signed)
Patient requesting "sleeping pill". Patient appears agitated. States she has been waiting 45 mins for someone to tell her if she can have a sleeping pill. Pt. States she was getting ready to "walk out of here" Off going shift reported that Dr. Harolyn Rutherford is aware of patient's request and has no opposition. Pt. Is refusing fetal monitoring at present. Ambien administered as ordered. Apologized to patient for the delay.

## 2013-11-10 ENCOUNTER — Encounter: Payer: Self-pay | Admitting: Advanced Practice Midwife

## 2013-11-10 DIAGNOSIS — O4690 Antepartum hemorrhage, unspecified, unspecified trimester: Principal | ICD-10-CM

## 2013-11-10 DIAGNOSIS — N760 Acute vaginitis: Secondary | ICD-10-CM

## 2013-11-10 DIAGNOSIS — O2390 Unspecified genitourinary tract infection in pregnancy, unspecified trimester: Secondary | ICD-10-CM

## 2013-11-10 NOTE — Progress Notes (Addendum)
Patient ID: Connie Wells, female   DOB: 1985/04/27, 28 y.o.   MRN: [id number] [place]) NOTE  Connie Wells is a 28 y.o. G4P1021 at [redacted]w[redacted]d  who is admitted for vaginal bleeding and suspected marginal separation. Length of Stay:  5  Days  Subjective: Patient reports the fetal movement as active. Patient reports uterine contraction  activity as none. Patient reports  vaginal bleeding as scant staining. Patient describes fluid per vagina as None.  Vitals:  Blood pressure 116/53, pulse 84, temperature 98.9 F (37.2 C), temperature source Oral, resp. rate 18, height 5\' 5"  (1.651 m), weight 142 lb 11.2 oz (64.728 kg), last menstrual period 02/28/2013, SpO2 100.00%. Physical Examination:  General appearance - alert, well appearing, and in no distress Abdomen - soft, nontender, nondistended, no masses or organomegaly Extremities - no edema, redness or tenderness in the calves or thighs Cervix-1/50/-3, posterior and tone.  Fetal Monitoring:  Baseline: 130 bpm, Variability: Good {> 6 bpm), Accelerations: Reactive and Decelerations: Absent Toco q -6 mild  Labs:  No results found for this or any previous visit (from the past 24 hour(s)).   Medications:  Scheduled . docusate sodium  100 mg Oral Daily  . famotidine  20 mg Oral BID  . LORazepam  1 mg Intravenous Once  . polyethylene glycol  17 g Oral Daily  . prenatal multivitamin  1 tablet Oral Q1200  . sodium chloride  3 mL Intravenous Q12H   I have reviewed the patient's current medications.  ASSESSMENT: Patient Active Problem List   Diagnosis Date Noted  . Pregnancy with third trimester bleeding 11/05/2013  . GBS bacteriuria 10/14/2013  . BV (bacterial vaginosis) 07/06/2013  . Drug use complicating pregnancy in second trimester 07/06/2013  . Hx of pyelonephritis 06/08/2013  . Supervision of normal subsequent pregnancy 06/08/2013  . HSV-2 infection complicating pregnancy 38/11/1749  .  SOLITARY KIDNEY 05/28/2006  . ANA POSITIVE, HX OF 05/02/2006    PLAN: Connie Wells is a 28 y.o. W2H8527 at [redacted]w[redacted]d  who is admitted for vaginal bleeding and suspected marginal separation. 1-Continue modified bedrest and fetal monitoring 2-Plan for d/c at 7 days of bleed 3-Pt to keep pads so we can evaluate staining.  Pad clear since yesterday. 4-Will watch for signs of labor (pt vertex by bedside US)  Connie Kierstead H. 11/10/2013,12:54 PM

## 2013-11-11 LAB — CBC
HCT: 25.8 % — ABNORMAL LOW (ref 36.0–46.0)
HEMOGLOBIN: 8.8 g/dL — AB (ref 12.0–15.0)
MCH: 32.6 pg (ref 26.0–34.0)
MCHC: 34.1 g/dL (ref 30.0–36.0)
MCV: 95.6 fL (ref 78.0–100.0)
Platelets: 190 10*3/uL (ref 150–400)
RBC: 2.7 MIL/uL — AB (ref 3.87–5.11)
RDW: 14.2 % (ref 11.5–15.5)
WBC: 10.2 10*3/uL (ref 4.0–10.5)

## 2013-11-11 LAB — TYPE AND SCREEN
ABO/RH(D): A POS
ANTIBODY SCREEN: NEGATIVE

## 2013-11-11 MED ORDER — PRENATAL MULTIVITAMIN CH: 1.0000 | ORAL_TABLET | Freq: Every day | ORAL | Status: DC

## 2013-11-11 NOTE — Discharge Instructions (Signed)
°  Vaginal Bleeding During Pregnancy, Third Trimester °A small amount of bleeding (spotting) from the vagina is relatively common in pregnancy. Various things can cause bleeding or spotting in pregnancy. Sometimes the bleeding is normal and is not a problem. However, bleeding during the third trimester can also be a sign of something serious for the mother and the baby. Be sure to tell your health care provider about any vaginal bleeding right away.  °Some possible causes of vaginal bleeding during the third trimester include:  °· The placenta may be partially or completely covering the opening to the cervix (placenta previa).   °· The placenta may have separated from the uterus (abruption of the placenta).   °· There may be an infection or growth on the cervix.   °· You may be starting labor, called discharging of the mucus plug.   °· The placenta may grow into the muscle layer of the uterus (placenta accreta).   °HOME CARE INSTRUCTIONS  °Watch your condition for any changes. The following actions may help to lessen any discomfort you are feeling:  °· Follow your health care provider's instructions for limiting your activity. If your health care provider orders bed rest, you may need to stay in bed and only get up to use the bathroom. However, your health care provider may allow you to continue light activity. °· If needed, make plans for someone to help with your regular activities and responsibilities while you are on bed rest. °· Keep track of the number of pads you use each day, how often you change pads, and how soaked (saturated) they are. Write this down. °· Do not use tampons. Do not douche. °· Do not have sexual intercourse or orgasms until approved by your health care provider. °· Follow your health care provider's advice about lifting, driving, and physical activities. °· If you pass any tissue from your vagina, save the tissue so you can show it to your health care provider.   °· Only take  over-the-counter or prescription medicines as directed by your health care provider. °· Do not take aspirin because it can make you bleed.   °· Keep all follow-up appointments as directed by your health care provider. °SEEK MEDICAL CARE IF: °· You have any vaginal bleeding during any part of your pregnancy. °· You have cramps or labor pains. °· You have a fever, not controlled by medicine. °SEEK IMMEDIATE MEDICAL CARE IF:  °· You have severe cramps or pain in your back or belly (abdomen). °· You have chills. °· You have a gush of fluid from the vagina. °· You pass large clots or tissue from your vagina. °· Your bleeding increases. °· You feel light-headed or weak. °· You pass out. °· You feel less movement or no movement of the baby.   °MAKE SURE YOU: °· Understand these instructions. °· Will watch your condition. °· Will get help right away if you are not doing well or get worse. °Document Released: 05/04/2002 Document Revised: 02/16/2013 Document Reviewed: 10/19/2012 °ExitCare® Patient Information ©2015 [employer]. This information is not intended to replace advice given to you by your health care provider. Make sure you discuss any questions you have with your health care provider. ° ° °

## 2013-11-11 NOTE — Discharge Summary (Signed)
Antenatal Physician Discharge Summary  Patient ID: Connie Wells MRN: [id number] DOB/AGE: 22-Aug-1985 28 y.o.  Admit date: 11/05/2013 Discharge date: 11/11/2013  Admission Diagnoses: vagina bleeding at 35 weeks  Discharge Diagnoses: same  Prenatal Procedures: NST and ultrasound  Intrapartum Procedures: Neonatology, Maternal Fetal Medicine   Significant Diagnostic Studies:  Results for orders placed during the hospital encounter of 11/05/13 (from the past 168 hour(s))  CBC   Collection Time    11/05/13  9:25 AM      Result Value Ref Range   WBC 11.0 (*) 4.0 - 10.5 K/uL   RBC 2.89 (*) 3.87 - 5.11 MIL/uL   Hemoglobin 9.4 (*) 12.0 - 15.0 g/dL   HCT 27.1 (*) 36.0 - 46.0 %   MCV 93.8  78.0 - 100.0 fL   MCH 32.5  26.0 - 34.0 pg   MCHC 34.7  30.0 - 36.0 g/dL   RDW 13.5  11.5 - 15.5 %   Platelets 179  150 - 400 K/uL  TYPE AND SCREEN   Collection Time    11/05/13  9:25 AM      Result Value Ref Range   ABO/RH(D) A POS     Antibody Screen NEG     Sample Expiration 11/08/2013    URINE RAPID DRUG SCREEN (HOSP PERFORMED)   Collection Time    11/06/13 12:15 PM      Result Value Ref Range   Opiates NONE DETECTED  NONE DETECTED   Cocaine NONE DETECTED  NONE DETECTED   Benzodiazepines NONE DETECTED  NONE DETECTED   Amphetamines NONE DETECTED  NONE DETECTED   Tetrahydrocannabinol POSITIVE (*) NONE DETECTED   Barbiturates NONE DETECTED  NONE DETECTED  CBC   Collection Time    11/08/13  5:45 AM      Result Value Ref Range   WBC 8.4  4.0 - 10.5 K/uL   RBC 2.52 (*) 3.87 - 5.11 MIL/uL   Hemoglobin 8.2 (*) 12.0 - 15.0 g/dL   HCT 23.8 (*) 36.0 - 46.0 %   MCV 94.4  78.0 - 100.0 fL   MCH 32.5  26.0 - 34.0 pg   MCHC 34.5  30.0 - 36.0 g/dL   RDW 13.8  11.5 - 15.5 %   Platelets 172  150 - 400 K/uL  TYPE AND SCREEN   Collection Time    11/08/13  5:46 AM      Result Value Ref Range   ABO/RH(D) A POS     Antibody Screen NEG     Sample Expiration 11/11/2013    CBC   Collection  Time    11/11/13  6:10 AM      Result Value Ref Range   WBC 10.2  4.0 - 10.5 K/uL   RBC 2.70 (*) 3.87 - 5.11 MIL/uL   Hemoglobin 8.8 (*) 12.0 - 15.0 g/dL   HCT 25.8 (*) 36.0 - 46.0 %   MCV 95.6  78.0 - 100.0 fL   MCH 32.6  26.0 - 34.0 pg   MCHC 34.1  30.0 - 36.0 g/dL   RDW 14.2  11.5 - 15.5 %   Platelets 190  150 - 400 K/uL  TYPE AND SCREEN   Collection Time    11/11/13  6:30 AM      Result Value Ref Range   ABO/RH(D) A POS     Antibody Screen NEG     Sample Expiration 11/14/2013      Treatments: IV hydration and observation  Hospital Course:  This  is a 28 y.o. [id number] with IUP at [redacted]w[redacted]d admitted for vaginal bleeding.  She was observed for 6 days with no further significant bleeding.  She denies abdominal pain or contractions or LOF.  She reports brownish pinkish discharge on pad.  She reports that she lives 7 min from hospital and reports that she could get her very quickly if she noted any active bleeding or other concerns.  She reports that she can be on limited activities at home.  She was observed, fetal heart rate monitoring remained reassuring, and she had no signs/symptoms of progressing bleeding or labor or other maternal-fetal concerns.  Per MFM pt should be observed 5-7 days and could be discharged if no further bleeding. She is interested in discharge today and was deemed stable for discharge to home with outpatient follow up. Reviewed with pt and her partner signs and sx fo f/u with.  Reviewed discharge instructions.  Discharge Exam: BP 103/64  Pulse 99  Temp(Src) 98.5 F (36.9 C) (Oral)  Resp 18  Ht 5\' 5"  (1.651 m)  Wt 142 lb 11.2 oz (64.728 kg)  BMI 23.75 kg/m2  SpO2 100%  LMP 02/28/2013 General appearance: alert and no distress GI: soft, non-tender; bowel sounds normal; no masses,  no organomegaly and gravid sanitary napkin: brown staining with some pink.  no evidence of active bleeding.     Discharge Condition: good  Disposition: 01-Home or Self  Care  Discharge Instructions   Discharge activity:  Bathroom / Shower only    Complete by:  As directed      Discharge diet:  No restrictions    Complete by:  As directed      Do not have sex or do anything that might make you have an orgasm    Complete by:  As directed      Notify physician for a general feeling that "something is not right"    Complete by:  As directed      Notify physician for increase or change in vaginal discharge    Complete by:  As directed      Notify physician for intestinal cramps, with or without diarrhea, sometimes described as "gas pain"    Complete by:  As directed      Notify physician for leaking of fluid    Complete by:  As directed      Notify physician for low, dull backache, unrelieved by heat or Tylenol    Complete by:  As directed      Notify physician for menstrual like cramps    Complete by:  As directed      Notify physician for pelvic pressure    Complete by:  As directed      Notify physician for uterine contractions.  These may be painless and feel like the uterus is tightening or the baby is  "balling up"    Complete by:  As directed      Notify physician for vaginal bleeding    Complete by:  As directed      PRETERM LABOR:  Includes any of the follwing symptoms that occur between 20 - [redacted] weeks gestation.  If these symptoms are not stopped, preterm labor can result in preterm delivery, placing your baby at risk    Complete by:  As directed      Sexual Activity:      Complete by:  As directed   NOTHING per vagina  Medication List         prenatal multivitamin Tabs tablet  Take 1 tablet by mouth daily at 12 noon.           Follow-up Information   [facility] In 1 week.      SignedLavonia Drafts M.D. 11/11/2013, 11:21 AM

## 2013-11-14 ENCOUNTER — Inpatient Hospital Stay (HOSPITAL_COMMUNITY)
Admission: AD | Admit: 2013-11-14 | Discharge: 2013-11-16 | DRG: 774 | Disposition: A | Discharge status: Home / Self Care | Payer: Medicaid Other | Source: Ambulatory Visit | Attending: Obstetrics and Gynecology | Admitting: Obstetrics and Gynecology

## 2013-11-14 ENCOUNTER — Encounter (HOSPITAL_COMMUNITY): Payer: Self-pay | Admitting: *Deleted

## 2013-11-14 ENCOUNTER — Inpatient Hospital Stay (HOSPITAL_COMMUNITY): Payer: Medicaid Other

## 2013-11-14 DIAGNOSIS — Z825 Family history of asthma and other chronic lower respiratory diseases: Secondary | ICD-10-CM

## 2013-11-14 DIAGNOSIS — O99344 Other mental disorders complicating childbirth: Secondary | ICD-10-CM | POA: Diagnosis present

## 2013-11-14 DIAGNOSIS — O99892 Other specified diseases and conditions complicating childbirth: Secondary | ICD-10-CM | POA: Diagnosis present

## 2013-11-14 DIAGNOSIS — Z22330 Carrier of Group B streptococcus: Secondary | ICD-10-CM

## 2013-11-14 DIAGNOSIS — Z87891 Personal history of nicotine dependence: Secondary | ICD-10-CM

## 2013-11-14 DIAGNOSIS — A6000 Herpesviral infection of urogenital system, unspecified: Secondary | ICD-10-CM | POA: Diagnosis present

## 2013-11-14 DIAGNOSIS — F1920 Other psychoactive substance dependence, uncomplicated: Secondary | ICD-10-CM

## 2013-11-14 DIAGNOSIS — F1210 Cannabis abuse, uncomplicated: Secondary | ICD-10-CM | POA: Diagnosis present

## 2013-11-14 DIAGNOSIS — Z8249 Family history of ischemic heart disease and other diseases of the circulatory system: Secondary | ICD-10-CM

## 2013-11-14 DIAGNOSIS — O42919 Preterm premature rupture of membranes, unspecified as to length of time between rupture and onset of labor, unspecified trimester: Secondary | ICD-10-CM

## 2013-11-14 DIAGNOSIS — O4690 Antepartum hemorrhage, unspecified, unspecified trimester: Secondary | ICD-10-CM

## 2013-11-14 DIAGNOSIS — O4290 Premature rupture of membranes, unspecified as to length of time between rupture and onset of labor, unspecified weeks of gestation: Principal | ICD-10-CM | POA: Diagnosis present

## 2013-11-14 DIAGNOSIS — O42013 Preterm premature rupture of membranes, onset of labor within 24 hours of rupture, third trimester: Secondary | ICD-10-CM | POA: Diagnosis present

## 2013-11-14 DIAGNOSIS — O98519 Other viral diseases complicating pregnancy, unspecified trimester: Secondary | ICD-10-CM | POA: Diagnosis present

## 2013-11-14 DIAGNOSIS — O4700 False labor before 37 completed weeks of gestation, unspecified trimester: Secondary | ICD-10-CM | POA: Diagnosis present

## 2013-11-14 DIAGNOSIS — O99324 Drug use complicating childbirth: Secondary | ICD-10-CM

## 2013-11-14 DIAGNOSIS — O9989 Other specified diseases and conditions complicating pregnancy, childbirth and the puerperium: Secondary | ICD-10-CM

## 2013-11-14 DIAGNOSIS — Z833 Family history of diabetes mellitus: Secondary | ICD-10-CM | POA: Diagnosis not present

## 2013-11-14 LAB — CBC
HEMATOCRIT: 25.9 % — AB (ref 36.0–46.0)
HEMOGLOBIN: 8.9 g/dL — AB (ref 12.0–15.0)
MCH: 33.2 pg (ref 26.0–34.0)
MCHC: 34.4 g/dL (ref 30.0–36.0)
MCV: 96.6 fL (ref 78.0–100.0)
Platelets: 193 10*3/uL (ref 150–400)
RBC: 2.68 MIL/uL — AB (ref 3.87–5.11)
RDW: 14.8 % (ref 11.5–15.5)
WBC: 18 10*3/uL — ABNORMAL HIGH (ref 4.0–10.5)

## 2013-11-14 LAB — HIV ANTIBODY (ROUTINE TESTING W REFLEX): HIV 1&2 Ab, 4th Generation: NONREACTIVE

## 2013-11-14 LAB — POCT FERN TEST
POCT FERN TEST: POSITIVE
POCT Fern Test: POSITIVE

## 2013-11-14 LAB — RPR

## 2013-11-14 MED ORDER — DIBUCAINE 1 % RE OINT: 1.0000 "application " | TOPICAL_OINTMENT | RECTAL | Status: DC | PRN

## 2013-11-14 MED ORDER — IBUPROFEN 600 MG PO TABS
600.0000 mg | ORAL_TABLET | Freq: Four times a day (QID) | ORAL | Status: DC
  Administered 2013-11-14 – 2013-11-16 (×7): 600 mg via ORAL
  Filled 2013-11-14 (×8): qty 1

## 2013-11-14 MED ORDER — PENICILLIN G POTASSIUM 5000000 UNITS IJ SOLR: 2.5000 10*6.[IU] | INTRAVENOUS | Status: DC

## 2013-11-14 MED ORDER — PRENATAL MULTIVITAMIN CH
1.0000 | ORAL_TABLET | Freq: Every day | ORAL | Status: DC
  Administered 2013-11-15: 1 via ORAL
  Filled 2013-11-14: qty 1

## 2013-11-14 MED ORDER — TETANUS-DIPHTH-ACELL PERTUSSIS 5-2.5-18.5 LF-MCG/0.5 IM SUSP: 0.5000 mL | Freq: Once | INTRAMUSCULAR | Status: DC

## 2013-11-14 MED ORDER — SIMETHICONE 80 MG PO CHEW: 80.0000 mg | CHEWABLE_TABLET | ORAL | Status: DC | PRN

## 2013-11-14 MED ORDER — BUTORPHANOL TARTRATE 1 MG/ML IJ SOLN
1.0000 mg | INTRAMUSCULAR | Status: DC | PRN
  Administered 2013-11-14 (×2): 1 mg via INTRAVENOUS
  Filled 2013-11-14 (×2): qty 1

## 2013-11-14 MED ORDER — WITCH HAZEL-GLYCERIN EX PADS: 1.0000 "application " | MEDICATED_PAD | CUTANEOUS | Status: DC | PRN

## 2013-11-14 MED ORDER — ZOLPIDEM TARTRATE 5 MG PO TABS: 5.0000 mg | ORAL_TABLET | Freq: Every evening | ORAL | Status: DC | PRN

## 2013-11-14 MED ORDER — INFLUENZA VAC SPLIT QUAD 0.5 ML IM SUSY: 0.5000 mL | PREFILLED_SYRINGE | INTRAMUSCULAR | Status: DC

## 2013-11-14 MED ORDER — OXYCODONE-ACETAMINOPHEN 5-325 MG PO TABS: 2.0000 | ORAL_TABLET | ORAL | Status: DC | PRN

## 2013-11-14 MED ORDER — OXYCODONE-ACETAMINOPHEN 5-325 MG PO TABS
1.0000 | ORAL_TABLET | ORAL | Status: DC | PRN
  Administered 2013-11-14: 1 via ORAL
  Filled 2013-11-14: qty 1

## 2013-11-14 MED ORDER — DIPHENHYDRAMINE HCL 25 MG PO CAPS: 25.0000 mg | ORAL_CAPSULE | Freq: Four times a day (QID) | ORAL | Status: DC | PRN

## 2013-11-14 MED ORDER — SODIUM CHLORIDE 0.9 % IV SOLN
2.0000 g | Freq: Once | INTRAVENOUS | Status: AC
  Administered 2013-11-14: 2 g via INTRAVENOUS
  Filled 2013-11-14: qty 2000

## 2013-11-14 MED ORDER — OXYTOCIN 40 UNITS IN LACTATED RINGERS INFUSION - SIMPLE MED
62.5000 mL/h | INTRAVENOUS | Status: DC
  Administered 2013-11-14: 62.5 mL/h via INTRAVENOUS

## 2013-11-14 MED ORDER — ONDANSETRON HCL 4 MG/2ML IJ SOLN: 4.0000 mg | Freq: Four times a day (QID) | INTRAMUSCULAR | Status: DC | PRN

## 2013-11-14 MED ORDER — LIDOCAINE HCL (PF) 1 % IJ SOLN
30.0000 mL | INTRAMUSCULAR | Status: DC | PRN
  Filled 2013-11-14: qty 30

## 2013-11-14 MED ORDER — ONDANSETRON HCL 4 MG/2ML IJ SOLN: 4.0000 mg | INTRAMUSCULAR | Status: DC | PRN

## 2013-11-14 MED ORDER — SENNOSIDES-DOCUSATE SODIUM 8.6-50 MG PO TABS
2.0000 | ORAL_TABLET | ORAL | Status: DC
  Administered 2013-11-15: 2 via ORAL
  Filled 2013-11-14 (×2): qty 2

## 2013-11-14 MED ORDER — LANOLIN HYDROUS EX OINT: TOPICAL_OINTMENT | CUTANEOUS | Status: DC | PRN

## 2013-11-14 MED ORDER — OXYCODONE-ACETAMINOPHEN 5-325 MG PO TABS
1.0000 | ORAL_TABLET | ORAL | Status: DC | PRN
  Administered 2013-11-15: 1 via ORAL
  Filled 2013-11-14 (×2): qty 1

## 2013-11-14 MED ORDER — CITRIC ACID-SODIUM CITRATE 334-500 MG/5ML PO SOLN: 30.0000 mL | ORAL | Status: DC | PRN

## 2013-11-14 MED ORDER — OXYTOCIN 40 UNITS IN LACTATED RINGERS INFUSION - SIMPLE MED
INTRAVENOUS | Status: AC
  Filled 2013-11-14: qty 1000

## 2013-11-14 MED ORDER — LACTATED RINGERS IV SOLN
INTRAVENOUS | Status: DC
  Administered 2013-11-14: 14:00:00 via INTRAVENOUS

## 2013-11-14 MED ORDER — BENZOCAINE-MENTHOL 20-0.5 % EX AERO: 1.0000 "application " | INHALATION_SPRAY | CUTANEOUS | Status: DC | PRN

## 2013-11-14 MED ORDER — OXYTOCIN BOLUS FROM INFUSION: 500.0000 mL | INTRAVENOUS | Status: DC

## 2013-11-14 MED ORDER — ACETAMINOPHEN 325 MG PO TABS: 650.0000 mg | ORAL_TABLET | ORAL | Status: DC | PRN

## 2013-11-14 MED ORDER — LACTATED RINGERS IV SOLN: 500.0000 mL | INTRAVENOUS | Status: DC | PRN

## 2013-11-14 MED ORDER — LIDOCAINE HCL (PF) 1 % IJ SOLN
INTRAMUSCULAR | Status: AC
  Filled 2013-11-14: qty 30

## 2013-11-14 MED ORDER — PENICILLIN G POTASSIUM 5000000 UNITS IJ SOLR: 5.0000 10*6.[IU] | Freq: Once | INTRAVENOUS | Status: DC

## 2013-11-14 MED ORDER — ONDANSETRON HCL 4 MG PO TABS: 4.0000 mg | ORAL_TABLET | ORAL | Status: DC | PRN

## 2013-11-14 NOTE — H&P (Signed)
Connie Wells is a 28 y.o. female [phone] with IUP at [redacted]w[redacted]d presenting for contractions that started last night and worsened today.  She felt she has a small gush of clear fluid this morning around 9-10am, enough to get her pad soaked.    Endorses good fetal movement. No recent vaginal bleeding  [facility] since 13.3 wks EDD 12/11/13 by [redacted]w[redacted]d U/S c/w LMP  Prenatal History/Complications: +THC in pregnancy, vaginal bleeding at 34.6 and suspected marginal separation: hospitalized x 7 days 1) +GBS bacteruria 2) single kidney 3) hx pyelo x 2 lifetime 4) HSV 2>> too uncomfortable to answer questions about prodromal symptoms 5) +THC in preg   Past Medical History: Past Medical History  Diagnosis Date  . Solitary kidney   . Herpes genitalia     Past Surgical History: Past Surgical History  Procedure Laterality Date  . Ectopic pregnancy surgery    . Dilation and evacuation      Obstetrical History: OB History   Grav Para Term Preterm Abortions TAB SAB Ect Mult Living   4 1 1  0 2 1 0 1 0 1      Social History: History   Social History  . Marital Status: Single    Spouse Name: N/A    Number of Children: N/A  . Years of Education: N/A   Social History Main Topics  . Smoking status: Former Smoker -- 5 years    Types: Cigarettes  . Smokeless tobacco: Never Used  . Alcohol Use: No     Comment: Occassional  . Drug Use: Yes    Special: Marijuana  . Sexual Activity: Yes   Other Topics Concern  . None   Social History Narrative  . None    Family History: Family History  Problem Relation Age of Onset  . Diabetes Mother   . Hypertension Mother   . Diabetes Maternal Aunt   . Asthma Brother     Allergies: No Known Allergies  Prescriptions prior to admission  Medication Sig Dispense Refill  . Prenatal Vit-Fe Fumarate-FA (PRENATAL MULTIVITAMIN) TABS tablet Take 1 tablet by mouth daily at 12 noon.  30 tablet  6     Review of Systems   Constitutional: No  fever, chills, fatigue  Blood pressure 121/76, pulse 104, temperature 98.4 F (36.9 C), resp. rate 16, last menstrual period 02/28/2013. General appearance: alert and moderate distress, not answering questions due to pain Lungs: clear to auscultation bilaterally Heart: regular rate and rhythm Abdomen: soft, non-tender; bowel sounds normal Pelvic: adequate Extremities: Homans sign is negative, no sign of DVT DTR's 2+ Presentation: cephalic per bedside U/S by Dr. Elly Modena  Fetal monitoringBaseline: 125 bpm, Variability: Good {> 6 bpm), Accelerations: Reactive and Decelerations: Absent Uterine activity Very irregular   Dilation: 2.5 Effacement (%): 90 Station: -2 Exam by:: Ginger Morris RN   Prenatal labs: ABO, Rh: --/--/A POS (09/17 0630) Antibody: NEG (09/17 0630) Rubella:  immune RPR: NON REAC (08/12 1130)  HBsAg: NEGATIVE (04/14 1601)  HIV: NONREACTIVE (08/12 1130)  GBS:   Positive urine 1 hr Glucola 110 Genetic screening  Decline Anatomy US Normal    Prenatal Transfer Tool  Maternal Diabetes: No Genetic Screening: Declined Maternal Ultrasounds/Referrals: Normal Fetal Ultrasounds or other Referrals:  None Maternal Substance Abuse:  Yes:  Type: Marijuana Significant Maternal Medications:  None Significant Maternal Lab Results: Lab values include: Group B Strep positive   Results for orders placed during the hospital encounter of 11/14/13 (from the past 24 hour(s))  POCT FERN TEST   Collection Time    11/14/13  1:08 PM      Result Value Ref Range   POCT Fern Test Positive = ruptured amniotic membanes    POCT FERN TEST   Collection Time    11/14/13  1:14 PM      Result Value Ref Range   POCT Fern Test Positive = ruptured amniotic membanes    CBC   Collection Time    11/14/13  1:40 PM      Result Value Ref Range   WBC 18.0 (*) 4.0 - 10.5 K/uL   RBC 2.68 (*) 3.87 - 5.11 MIL/uL   Hemoglobin 8.9 (*) 12.0 - 15.0 g/dL   HCT 25.9 (*) 36.0 - 46.0 %   MCV 96.6  78.0  - 100.0 fL   MCH 33.2  26.0 - 34.0 pg   MCHC 34.4  30.0 - 36.0 g/dL   RDW 14.8  11.5 - 15.5 %   Platelets 193  150 - 400 K/uL    Assessment: Connie Wells is a 28 y.o. G4P1021 at [redacted]w[redacted]d by [redacted]w[redacted]d U/S c/w LMP presenting with ROM. #Labor:Admit to L&D for PPROM. Will re-assess on L&D to determine induction management.  #Pain: Stadol, may have epidural upon request (will want) #FWB: Cat 1 #ID:  GBS pos, PCN #MOF: May try breast feeding  #MOC:Undecided #Circ:  Girl  Archie Patten 11/14/2013, 2:29 PM

## 2013-11-14 NOTE — Progress Notes (Signed)
Notified of pt arrival in MAU and positive fern test. In circucision and will call back

## 2013-11-14 NOTE — MAU Note (Signed)
Pt presents to MAU with complaints of contractions that started last night and have gotten worse throughout the day. She was admitted to antenatal last week for vaginal bleeding and was discharged home on Thursday.

## 2013-11-14 NOTE — MAU Note (Signed)
Pt removed pulse ox

## 2013-11-14 NOTE — H&P (Addendum)
Agree with assessment. 

## 2013-11-14 NOTE — Progress Notes (Signed)
Dr Elly Modena notified of pt's arrival and last U/S on 9-11 showed baby was breech. Orders received for U/S for presentation at bedside.

## 2013-11-15 ENCOUNTER — Encounter: Payer: Self-pay | Admitting: Obstetrics & Gynecology

## 2013-11-15 NOTE — Lactation Note (Signed)
This note was copied from the chart of Connie Maylyn Narvaiz. Lactation Consultation Note Took DEBP into rm. Along w/information on LPI. Mom didn't BF w/her first child and "wanted to try" w/this baby. Mom stated that she really didn't think she would stick w/BF and that she'd prefer to formula feed and the FOB preferred her to formula feed. Mom concerned that the baby is 5'2 lbs. And she feels that she more than what she could give her from her breast. Mom states she knows that BF is better for the baby but didn't feel like it was for her and the baby wasn't interested in BF. I explained LPI behavior and how the colostrum is good for intestines and immune system. Explained mom could pump or hand expressed colostrum and give in bottle. Stated she would just give formula. I gave her the BF consultant pamphlet and encouraged her to call for any questions or call LC if she changes her mind and we would be happy to help her. Discussed engorgement care. [place] brochure given w/resources, support groups and [employer] services. Mom has [place] and stated that she will be getting the baby on [facility]. RN in the rm. Will be getting mom formula similac 22 cal.  Patient Name: Connie Wells Today's Date: 11/15/2013 Reason for consult: Initial assessment   Maternal Data Has patient been taught Hand Expression?: Yes Does the patient have breastfeeding experience prior to this delivery?: No  Feeding    LATCH Score/Interventions                      Lactation Tools Discussed/Used WIC Program: Yes   Consult Status Consult Status: Complete    [name] G 11/15/2013, 1:52 AM

## 2013-11-15 NOTE — Lactation Note (Signed)
This note was copied from the chart of Connie Dashae Wilcher. Lactation Consultation Note Follow up visit at 23 hours of age.  MBU RN reports mom is back and forth with breast vs formula feeding.  Baby has a low blood sugar of 33 and is jittery per RN.  Mom reports a lot of people have been in her room and she can't rest.  She is not sure why RN would want LC to visit.  RN reports mom had a visit from Education officer, museum and is going to have to have a CPS visit due to baby's urine + THC.  THis may not have been the best time for a visit, but do to low CBG follow up was indicated.  Mom is not receptive to plan to breast feed and then supplement with formula due to LTI status and SGA.  Mom seems to have difficulty understanding.  Encouraged mom to allow STS with baby and to feed on demand.  Mom reports baby just fed for 10 minutes.   Follow up with MBU RN who reports having same difficulty getting mom to understand bresatfeeding with supplementation possible due to mom's lack of commitment to breast.  Encouraged use of DEBP, MBU RN to set up if mom is more receptive later.  CPS worker arrived at end of visit.  Mom to call for assist as needed.    Patient Name: Connie Wells [id number] Date: 11/15/2013 Reason for consult: Follow-up assessment;Infant < 6lbs;Late preterm infant   Maternal Data    Feeding Feeding Type: Breast Fed Nipple Type: Slow - flow Length of feed: 10 min  LATCH Score/Interventions                Intervention(s): Breastfeeding basics reviewed     Lactation Tools Discussed/Used Initiated by:: MBU RN to set up Date initiated:: 11/15/13   Consult Status Consult Status: Follow-up Date: 11/16/13 Follow-up type: In-patient    Connie Wells 11/15/2013, 4:27 PM

## 2013-11-15 NOTE — Progress Notes (Signed)
Ur chart review completed.  

## 2013-11-15 NOTE — Progress Notes (Signed)
Clinical Social Work Department PSYCHOSOCIAL ASSESSMENT - MATERNAL/CHILD 11/15/2013  Patient:  RODNEISHA, BONNET  Account Number:  [id number]  Admit Date:  11/14/2013  Marjo Bicker Name:   Pecola Leisure Girl Foskett (undecided on name at time of assessment)   Clinical Social Worker:  Loleta Books, CLINICAL SOCIAL WORKER   Date/Time:  11/15/2013 11:00 AM  Date Referred:  11/14/2013   Referral source  Central Nursery     Referred reason  Substance Abuse   Other referral source:    I:  FAMILY / HOME ENVIRONMENT Child's legal guardian:  PARENT  Guardian - Name Guardian - Age Guardian - Address  Yaire Kreher [address] [place], [place] [address]  Jill Alexanders 29 different residence   Other household support members/support persons Name Relationship DOB   DAUGHTER 2010   Other support:   MOB stated that her mother is supportive.  MOB also identified a couple of friends; however, she shared preference to not use help since she and the FOB do not want to be considered "dependent" on others.    II  PSYCHOSOCIAL DATA Information Source:  Patient Interview  Event organiser Employment:   MOB stated that she is not working. She was previously working at [employer] but does not plan on returning during the postpartum period.  FOB stated that he is employed,and works 3rd shift.   Financial resources:  Medicaid If Medicaid - County:  [place] Other  [employer]  Chemical engineer / Grade:  N/A Government social research officer / Child Services Coordination / Early Interventions:   N/A  Cultural issues impacting care:   None reported    III  STRENGTHS Strengths  Adequate Resources  Supportive family/friends   Strength comment:    IV  RISK FACTORS AND CURRENT PROBLEMS Current Problem:  YES   Risk Factor & Current Problem Patient Issue Family Issue Risk Factor / Current Problem Comment  Substance Abuse Y N MOB admits to regular THC use during her  pregnancy.  Baby's UDS is positive for THC and meconium is pending.    V  SOCIAL WORK ASSESSMENT CSW met with MOB in her room in order to complete the assessment. Consult was ordered due to MOB presenting with a history of THC use during her pregnancy.  MOB had positive UDS for [facility] in April and in September.  The FOB was present for only first 10 minutes, but then left due to other obligations.  MOB and FOB were originally easily engaged, but MOB became more defensive, guarded, and suspicious as CSW shared reason for consult.  MOB was observed to be attentive and bonding with the baby.  MOB displayed full range in affect and presented in an appropriate mood.   MOB and FOB expressed normative feelings of being overwhelmed since the baby arrived about 4 weeks early.  Per MOB, they have not yet had their baby shower, but that her mother is helping secure the necessities prior to her returning home.  MOB expressed excitement about having a newborn, and acknowledged that her 25 year old daughter may experience an adjustment period as she transitions to not being an older daughter.  MOB was receptive to exploring with the CSW thoughts, feelings, and behaviors that her older daughter may experience.   CSW inquired about the MOB's support system. She stated that the FOB does not live in their home but intends to be involved in their lives.  She stated that she has a few people  with whom she can rely on, but stated that she does not like reaching out to support since she does not want to seem "dependent".  CSW validated her attempts to be supportive, but explored with MOB how her support system may be utilized in times without feeling as if she is taking advantage of them.  MOB continued to maintain preference to maintain boundaries with others. MOB denied mental health history and denied prior history of postpartum depression.  She was receptive to the education offered by the CSW.   CSW inquired about substance use.   MOB admitted to [facility] use during her pregnancy, and stated that it assisted her with nausea.  She admitted to smoking most recently 2 weeks ago.  MOB denied any other substance use.  CSW shared information on drug screen policy, MOB verbalized understanding; however, she began to ask if all women's charts are reviewed for substance use since she reported feeling "singled out" by the hospital.  CSW validated her feelings, but confirmed with the MOB that all women's charts are monitored for substance use and not just hers.   MOB continued to believe that she was being "watched", and stated that she did not understand the policy since following the birth of her other child, there was no hospital social worker who visited her and she smoked THC throughout that pregnancy as well. MOB stated that she did not want CPS in her home, and denied previous CPS involvement.  CSW inquired about hesitation to allow CPS in their home, but MOB never clarified why she did not want them in the home since she reported that she had "nothing to hide".    CSW made CPS report with [facility] due to baby's UDS being positive for THC.  UDS results were not back at time of assessment with MOB.  CSW will follow-up with MOB to discuss results.    VI SOCIAL WORK PLAN Social Work Plan  Child Scientist, forensic Report  Patient/Family Education   Type of pt/family education:   Postpartum depression and hospital drug screen policy   If child protective services report - county:  [place] If child protective services report - date:  11/15/2013 Information/referral to community resources comment:   Other social work plan:   CSW to consult with CPS to determine their recommendations for follow-up.

## 2013-11-15 NOTE — Progress Notes (Addendum)
CSW received confirmation that [facility] CPS has accepted the case.  Alinda Money is the assigned worker. Per Sonia Side, he intends to meet with the MOB this afternoon (9/21).  CSW informed MOB of the upcoming visit.  MOB did not make eye contact, presented as irritable, and stated that she is tired of people coming in and asking her questions.  CSW validated the frustration, and offered to follow-up with her after the CPS visit. MOB declined offer and stated that she wanted the CSW to speak with the CPS worker about the outcome versus herself.    CSW to follow-up with CPS following their assessment in order to receive recommendations.

## 2013-11-15 NOTE — Progress Notes (Signed)
Post Partum Day 1 Subjective: no complaints, up ad lib, voiding, tolerating PO and + flatus  Objective: Blood pressure 108/67, pulse 83, temperature 98.1 F (36.7 C), temperature source Oral, resp. rate 18, last menstrual period 02/28/2013, SpO2 100.00%, unknown if currently breastfeeding.  Physical Exam:  General: alert, cooperative and no distress Lochia: appropriate Uterine Fundus: firm DVT Evaluation: No evidence of DVT seen on physical exam. Negative Homan's sign. No cords or calf tenderness.   Recent Labs  11/14/13 1340  HGB 8.9*  HCT 25.9*    Assessment/Plan: Plan for discharge tomorrow, Breastfeeding and Lactation consult   LOS: 1 day   Connie Wells [name] 11/15/2013, 8:58 AM

## 2013-11-16 MED ORDER — IBUPROFEN 600 MG PO TABS: 600.0000 mg | ORAL_TABLET | Freq: Four times a day (QID) | ORAL | Status: DC

## 2013-11-16 NOTE — Progress Notes (Signed)
CSW spoke with CPS worker to inquire about outcome of assessment on 9/21. Per CPS, MOB was uncooperative with him and verbalized being upset about their involvement.  He stated that MOB was resistant to allowing him complete a home visit and to complete a drug screen.    CPS stated that the baby is not allowed to go home until [facility] complies and allows CPS to complete home visit.  MOB is aware of need to comply prior to baby's discharge.    CSW to follow-up with MOB and will continue to be in contact with CPS.

## 2013-11-16 NOTE — Discharge Summary (Signed)
Obstetric Discharge Summary Reason for Admission: onset of labor Prenatal Procedures: none Intrapartum Procedures: spontaneous vaginal delivery Postpartum Procedures: none Complications-Operative and Postpartum: none  Delivery Note At 4:27 PM a viable female was delivered via Vaginal, Spontaneous Delivery (Presentation: ;  ).  APGAR: 9, 9; weight 5 lb 3.8 oz (2375 g).   Placenta status: Intact, Spontaneous.  Cord: 3 vessels with the following complications: None.  Cord pH: not obtained  Anesthesia: None  Episiotomy: None Lacerations: None Est. Blood Loss (mL): 250  Mom to postpartum.  Baby to Couplet care / Skin to Skin.  Connie Wells 11/16/2013, 9:01 AM     Hospital Course:  Active Problems:   Preterm premature rupture of membranes (PPROM) with onset of labor within 24 hours of rupture in third trimester, antepartum   Today: No acute events overnight.  Pt denies problems with ambulating, voiding or po intake.  She denies nausea or vomiting.  Pain is well controlled.  She has had flatus. She has not had bowel movement.  Lochia Minimal.  Plan for birth control is  IUD.  Method of Feeding: BR + Bottle  AMYE GREGO is a 28 y.o. [id number] s/p NSVD.  Patient presented to OBT contractions and was admitted to L&D.  She has postpartum course that was uncomplicated including no problems with ambulating, PO intake, urination, pain, or bleeding. The pt feels ready to go home and  will be discharged with outpatient follow-up.    H/H: Lab Results  Component Value Date/Time   HGB 8.9* 11/14/2013  1:40 PM   HCT 25.9* 11/14/2013  1:40 PM    Discharge Diagnoses: Term Pregnancy-delivered  Discharge Information: Date: 11/16/2013 Activity: pelvic rest Diet: routine  Medications: PNV and Ibuprofen Breast feeding:  Yes Condition: stable Instructions: refer to handout Discharge to: home   Discharge Instructions   Activity as tolerated    Complete by:  As directed      Call MD for:  difficulty breathing, headache or visual disturbances    Complete by:  As directed      Call MD for:  hives    Complete by:  As directed      Call MD for:  persistant dizziness or light-headedness    Complete by:  As directed      Call MD for:  persistant nausea and vomiting    Complete by:  As directed      Call MD for:  severe uncontrolled pain    Complete by:  As directed      Call MD for:  temperature >100.4    Complete by:  As directed      Diet - low sodium heart healthy    Complete by:  As directed      Discharge instructions    Complete by:  As directed   Taking care of yourself after Baby arrives. Vaginal Bleeding: Vaginal bleeding is common after delivery, with the amount decreasing gradually over 1-2 weeks. If the bleeding increases, is mixed with pus, or is foul-smelling, call your doctor, as this may be a sign of infection.  Abdominal Pain: Abdominal cramping after delivery is common, especially when you breastfeed. The same hormones responsible for letting milk down to your nipple also contract your uterus. If the pain worsens, or occurs more frequently over 48 hrs after delivery, call your doctor.  Fevers: After delivery you are at increased risk of developing an infection. If you have a fever, increased vaginal bleeding, foul-smelling vaginal discharge, or  increased abdominal pain, call your doctor.  Breast Feeding: Feeding every 1.5-3 hours keeps Baby satisfied and your milk in good supply. If 3 hours have gone by and Baby is sleeping, wake him/her up to feed. Nurse for 15-20 minutes on one breast before offering the other. Breast-fed babies often lose up to 7% of their birth weight in the first few days of life, but should start gaining about an ounce per day after 4 days. For more information about breastfeeding, go to [email or web address].  Mastitis (Breast infection): Breaks in the skin or bacteria passing into your breast ducts can cause an  infection. If you notice a triangular shaped area on your breast that is red, warm to the touch and tender, call your doctor. It is safe for Baby and helps you to heal faster if you keep breast feeding through this infection.  Postpartum Depression: Postpartum depression is very common after a woman delivers because of all the hormonal changes happening in her body. If you notice that you start to feel more sad or anxious than usual or have any thoughts of hurting yourself or Baby, tell someone right away.  If you have any questions or concerns, please call your doctor.            Medication List         ibuprofen 600 MG tablet  Commonly known as:  ADVIL,MOTRIN  Take 1 tablet (600 mg total) by mouth every 6 (six) hours.     prenatal multivitamin Tabs tablet  Take 1 tablet by mouth daily at 12 noon.         Connie Wells ,MD OB Fellow 11/16/2013,9:01 AM

## 2013-11-16 NOTE — Progress Notes (Signed)
CSW spoke with CPS.  CPS stated that they completed a home visit, the home is prepared, and the home is safe for the baby.  Per CPS, there are no barriers to discharge.

## 2013-11-16 NOTE — Lactation Note (Signed)
This note was copied from the chart of Connie Wells. Lactation Consultation Note: Follow up visit with mom. She reports that baby latches but goes off to sleep. Baby nursed for about 5 mins- needed stimulation to continue nursing. Bottle fed formula. Mom has not been pumping- manual pump given with instructions for use and cleaning. Mom pumped a few drops of milk and fed it to baby by finger. Baby rooting so put baby back to breast. With compressions baby nursed well for another 7 min.with swallows noted. Mom reports this is better than she has been doing. Encouraged to pre pump to get milk flowing, then latch baby. To use breast compression while she is nursing. Reports left nipple is sore- comfort gels given with instruction for use. Does not have a bra with her so will not put them on yet. No questions at present. To call for assist prn  Patient Name: Connie Wells [id number] Date: 11/16/2013 Reason for consult: Follow-up assessment;Infant < 6lbs;Late preterm infant   Maternal Data Formula Feeding for Exclusion: No  Feeding Feeding Type: Breast Fed Length of feed: 10 min  LATCH Score/Interventions Latch: Grasps breast easily, tongue down, lips flanged, rhythmical sucking.  Audible Swallowing: A few with stimulation Intervention(s): Hand expression  Type of Nipple: Everted at rest and after stimulation  Comfort (Breast/Nipple): Filling, red/small blisters or bruises, mild/mod discomfort  Problem noted: Mild/Moderate discomfort Interventions (Mild/moderate discomfort): Comfort gels;Pre-pump if needed  Hold (Positioning): No assistance needed to correctly position infant at breast.  LATCH Score: 8  Lactation Tools Discussed/Used     Consult Status Consult Status: Follow-up Date: 11/17/13 Follow-up type: In-patient    Truddie Crumble 11/16/2013, 11:08 AM

## 2013-11-17 NOTE — Discharge Summary (Signed)
Attestation of Attending Supervision of Obstetric Fellow: Evaluation and management procedures were performed by the Obstetric Fellow under my supervision and collaboration.  I have reviewed the Obstetric Fellow's note and chart, and I agree with the management and plan.  Jacob Stinson, DO Attending Physician Faculty Practice, [facility]  

## 2013-11-18 ENCOUNTER — Encounter: Payer: Medicaid Other | Admitting: Obstetrics and Gynecology

## 2013-12-24 ENCOUNTER — Encounter: Payer: Self-pay | Admitting: General Practice

## 2013-12-27 ENCOUNTER — Ambulatory Visit (INDEPENDENT_AMBULATORY_CARE_PROVIDER_SITE_OTHER): Payer: Medicaid Other | Admitting: Nurse Practitioner

## 2013-12-27 ENCOUNTER — Encounter: Payer: Self-pay | Admitting: Nurse Practitioner

## 2013-12-27 DIAGNOSIS — Z308 Encounter for other contraceptive management: Secondary | ICD-10-CM

## 2013-12-27 DIAGNOSIS — Z309 Encounter for contraceptive management, unspecified: Secondary | ICD-10-CM | POA: Insufficient documentation

## 2013-12-27 NOTE — Patient Instructions (Signed)
Levonorgestrel intrauterine device (IUD) What is this medicine? LEVONORGESTREL IUD (LEE voe nor jes trel) is a contraceptive (birth control) device. The device is placed inside the uterus by a healthcare professional. It is used to prevent pregnancy and can also be used to treat heavy bleeding that occurs during your period. Depending on the device, it can be used for 3 to 5 years. This medicine may be used for other purposes; ask your health care provider or pharmacist if you have questions. COMMON BRAND NAME(S): LILETTA, Mirena, Skyla What should I tell my health care provider before I take this medicine? They need to know if you have any of these conditions: -abnormal Pap smear -cancer of the breast, uterus, or cervix -diabetes -endometritis -genital or pelvic infection now or in the past -have more than one sexual partner or your partner has more than one partner -heart disease -history of an ectopic or tubal pregnancy -immune system problems -IUD in place -liver disease or tumor -problems with blood clots or take blood-thinners -use intravenous drugs -uterus of unusual shape -vaginal bleeding that has not been explained -an unusual or allergic reaction to levonorgestrel, other hormones, silicone, or polyethylene, medicines, foods, dyes, or preservatives -pregnant or trying to get pregnant -breast-feeding How should I use this medicine? This device is placed inside the uterus by a health care professional. Talk to your pediatrician regarding the use of this medicine in children. Special care may be needed. Overdosage: If you think you have taken too much of this medicine contact a poison control center or emergency room at once. NOTE: This medicine is only for you. Do not share this medicine with others. What if I miss a dose? This does not apply. What may interact with this medicine? Do not take this medicine with any of the following  medications: -amprenavir -bosentan -fosamprenavir This medicine may also interact with the following medications: -aprepitant -barbiturate medicines for inducing sleep or treating seizures -bexarotene -griseofulvin -medicines to treat seizures like carbamazepine, ethotoin, felbamate, oxcarbazepine, phenytoin, topiramate -modafinil -pioglitazone -rifabutin -rifampin -rifapentine -some medicines to treat HIV infection like atazanavir, indinavir, lopinavir, nelfinavir, tipranavir, ritonavir -St. John's wort -warfarin This list may not describe all possible interactions. Give your health care provider a list of all the medicines, herbs, non-prescription drugs, or dietary supplements you use. Also tell them if you smoke, drink alcohol, or use illegal drugs. Some items may interact with your medicine. What should I watch for while using this medicine? Visit your doctor or health care professional for regular check ups. See your doctor if you or your partner has sexual contact with others, becomes HIV positive, or gets a sexual transmitted disease. This product does not protect you against HIV infection (AIDS) or other sexually transmitted diseases. You can check the placement of the IUD yourself by reaching up to the top of your vagina with clean fingers to feel the threads. Do not pull on the threads. It is a good habit to check placement after each menstrual period. Call your doctor right away if you feel more of the IUD than just the threads or if you cannot feel the threads at all. The IUD may come out by itself. You may become pregnant if the device comes out. If you notice that the IUD has come out use a backup birth control method like condoms and call your health care provider. Using tampons will not change the position of the IUD and are okay to use during your period. What side effects may   I notice from receiving this medicine? Side effects that you should report to your doctor or  health care professional as soon as possible: -allergic reactions like skin rash, itching or hives, swelling of the face, lips, or tongue -fever, flu-like symptoms -genital sores -high blood pressure -no menstrual period for 6 weeks during use -pain, swelling, warmth in the leg -pelvic pain or tenderness -severe or sudden headache -signs of pregnancy -stomach cramping -sudden shortness of breath -trouble with balance, talking, or walking -unusual vaginal bleeding, discharge -yellowing of the eyes or skin Side effects that usually do not require medical attention (report to your doctor or health care professional if they continue or are bothersome): -acne -breast pain -change in sex drive or performance -changes in weight -cramping, dizziness, or faintness while the device is being inserted -headache -irregular menstrual bleeding within first 3 to 6 months of use -nausea This list may not describe all possible side effects. Call your doctor for medical advice about side effects. You may report side effects to FDA at 1-800-FDA-1088. Where should I keep my medicine? This does not apply. NOTE: This sheet is a summary. It may not cover all possible information. If you have questions about this medicine, talk to your doctor, pharmacist, or health care provider.  2015, Elsevier/Gold Standard. (2011-03-14 13:54:04)  

## 2013-12-27 NOTE — Progress Notes (Signed)
Patient ID: Connie Wells, female   DOB: [birth year], 28 y.o.   MRN: [id number] Subjective:     Connie Wells is a 28 y.o. female who presents for a postpartum visit. She is 6 weeks postpartum following a spontaneous vaginal delivery. I have fully reviewed the prenatal and intrapartum course. The delivery was at 7  gestational weeks. Outcome: spontaneous vaginal delivery. Anesthesia: IV sedation. Postpartum course has been uneventful. Baby's course has been uneventful. Baby is feeding by bottle - Neosure. Bleeding no bleeding. Bowel function is normal. Bladder function is normal. Patient is sexually active. Contraception method is none. Last intercourse was 3-4 days ago unprotected. She plans Mirenia IUD. Postpartum depression screening: negative.  The following portions of the patient's history were reviewed and updated as appropriate: current medications, past family history, past medical history, past social history, past surgical history and problem list.  Review of Systems   Objective:    BP 115/68 mmHg  Pulse 82  Temp(Src) 98.4 F (36.9 C) (Oral)  Ht 5\' 4"  (1.626 m)  Wt 134 lb 9.6 oz (61.054 kg)  BMI 23.09 kg/m2  Breastfeeding? No  General:  alert and cooperative  Neck:  Supple. No nodules or thyromegaly.    Breasts:  inspection negative, no nipple discharge or bleeding, no masses or nodularity palpable  Lungs: clear to auscultation bilaterally  Heart:  regular rate and rhythm, S1, S2 normal, no murmur, click, rub or gallop. No lower extremity edema. Distal pulses intact.     Abdomen: soft, non-tender; bowel sounds normal; no masses,  no organomegaly        Assessment:     postpartum exam. Pap smear not done at today's visit.   Plan:    1. Contraception: none/ plans Mirenia IUD in 2 weeks   3. Follow up in: 2 week or as needed.

## 2013-12-27 NOTE — Progress Notes (Signed)
Patient here for PP visit. Not sure of what she would like to do for birth control. Confesses to having unprotected sex 3-4 days ago. Informed patient we will not be able to start her on birth control today-- she will need to abstain from or use protection with sexual activity for atleast 2 weeks-- she can then return for UPT and if UPT negative will be able to give her birth control of her choice. Patient verbalized understanding.

## 2014-01-31 ENCOUNTER — Ambulatory Visit: Payer: Medicaid Other | Admitting: Nurse Practitioner

## 2014-12-21 ENCOUNTER — Emergency Department (HOSPITAL_COMMUNITY)
Admission: EM | Admit: 2014-12-21 | Discharge: 2014-12-21 | Disposition: A | Discharge status: Home / Self Care | Payer: Medicaid Other | Attending: Physician Assistant | Admitting: Physician Assistant

## 2014-12-21 ENCOUNTER — Emergency Department (HOSPITAL_COMMUNITY): Payer: Medicaid Other

## 2014-12-21 ENCOUNTER — Encounter (HOSPITAL_COMMUNITY): Payer: Self-pay | Admitting: Emergency Medicine

## 2014-12-21 DIAGNOSIS — Z87891 Personal history of nicotine dependence: Secondary | ICD-10-CM | POA: Diagnosis not present

## 2014-12-21 DIAGNOSIS — O210 Mild hyperemesis gravidarum: Secondary | ICD-10-CM | POA: Insufficient documentation

## 2014-12-21 DIAGNOSIS — Z3A22 22 weeks gestation of pregnancy: Secondary | ICD-10-CM | POA: Diagnosis not present

## 2014-12-21 DIAGNOSIS — R51 Headache: Secondary | ICD-10-CM | POA: Insufficient documentation

## 2014-12-21 DIAGNOSIS — O26891 Other specified pregnancy related conditions, first trimester: Secondary | ICD-10-CM

## 2014-12-21 DIAGNOSIS — R519 Headache, unspecified: Secondary | ICD-10-CM

## 2014-12-21 DIAGNOSIS — Z8619 Personal history of other infectious and parasitic diseases: Secondary | ICD-10-CM | POA: Diagnosis not present

## 2014-12-21 DIAGNOSIS — O9989 Other specified diseases and conditions complicating pregnancy, childbirth and the puerperium: Secondary | ICD-10-CM | POA: Insufficient documentation

## 2014-12-21 DIAGNOSIS — O219 Vomiting of pregnancy, unspecified: Secondary | ICD-10-CM

## 2014-12-21 LAB — BASIC METABOLIC PANEL
ANION GAP: 11 (ref 5–15)
BUN: 5 mg/dL — ABNORMAL LOW (ref 6–20)
CO2: 21 mmol/L — AB (ref 22–32)
Calcium: 9.1 mg/dL (ref 8.9–10.3)
Chloride: 102 mmol/L (ref 101–111)
Creatinine, Ser: 0.52 mg/dL (ref 0.44–1.00)
GFR calc Af Amer: >60 mL/min (ref >60)
GLUCOSE: 95 mg/dL (ref 65–99)
POTASSIUM: 3.9 mmol/L (ref 3.5–5.1)
Sodium: 134 mmol/L — ABNORMAL LOW (ref 135–145)

## 2014-12-21 LAB — CBC WITH DIFFERENTIAL/PLATELET
BASOS ABS: 0 10*3/uL (ref 0.0–0.1)
Basophils Relative: 0 %
Eosinophils Absolute: 0.1 10*3/uL (ref 0.0–0.7)
Eosinophils Relative: 1 %
HEMATOCRIT: 32.4 % — AB (ref 36.0–46.0)
Hemoglobin: 11.2 g/dL — ABNORMAL LOW (ref 12.0–15.0)
LYMPHS PCT: 24 %
Lymphs Abs: 2.5 10*3/uL (ref 0.7–4.0)
MCH: 32.6 pg (ref 26.0–34.0)
MCHC: 34.6 g/dL (ref 30.0–36.0)
MCV: 94.2 fL (ref 78.0–100.0)
MONOS PCT: 7 %
Monocytes Absolute: 0.7 10*3/uL (ref 0.1–1.0)
NEUTROS ABS: 7.1 10*3/uL (ref 1.7–7.7)
Neutrophils Relative %: 68 %
Platelets: 256 10*3/uL (ref 150–400)
RBC: 3.44 MIL/uL — ABNORMAL LOW (ref 3.87–5.11)
RDW: 13.7 % (ref 11.5–15.5)
WBC: 10.4 10*3/uL (ref 4.0–10.5)

## 2014-12-21 LAB — URINALYSIS, ROUTINE W REFLEX MICROSCOPIC
Bilirubin Urine: NEGATIVE
Glucose, UA: NEGATIVE mg/dL
Hgb urine dipstick: NEGATIVE
KETONES UR: NEGATIVE mg/dL
NITRITE: NEGATIVE
PH: 7 (ref 5.0–8.0)
Protein, ur: NEGATIVE mg/dL
Specific Gravity, Urine: 1.007 (ref 1.005–1.030)
UROBILINOGEN UA: 1 mg/dL (ref 0.0–1.0)

## 2014-12-21 LAB — WET PREP, GENITAL
TRICH WET PREP: NONE SEEN
YEAST WET PREP: NONE SEEN

## 2014-12-21 LAB — ABO/RH: ABO/RH(D): A POS

## 2014-12-21 LAB — HCG, QUANTITATIVE, PREGNANCY: HCG, BETA CHAIN, QUANT, S: 134742 m[IU]/mL — AB (ref <5)

## 2014-12-21 LAB — URINE MICROSCOPIC-ADD ON

## 2014-12-21 LAB — POC URINE PREG, ED: PREG TEST UR: POSITIVE — AB

## 2014-12-21 MED ORDER — ACETAMINOPHEN 325 MG PO TABS
650.0000 mg | ORAL_TABLET | Freq: Once | ORAL | Status: AC
  Administered 2014-12-21: 650 mg via ORAL
  Filled 2014-12-21: qty 2

## 2014-12-21 MED ORDER — PROMETHAZINE HCL 12.5 MG PO TABS: 12.5000 mg | ORAL_TABLET | Freq: Four times a day (QID) | ORAL | Status: DC | PRN

## 2014-12-21 NOTE — ED Notes (Signed)
The pt is threatening to walk out she is tired of waiting c/o a headache that she has not mentioned previously.  Getting dressed she is not wanting to wait for the [place] np noitified

## 2014-12-21 NOTE — ED Notes (Signed)
Pt sts positive home pregnancy test the beginning of October and unsure of when last period was; pt sts some HA and nausea; pt G6 P2 M2 A1 with hx of tubal pregnancy

## 2014-12-21 NOTE — ED Provider Notes (Signed)
CSN: [id number]     Arrival date & time 12/21/14  1322 History  By signing my name below, I, Connie Wells, attest that this documentation has been prepared under the direction and in the presence of [name], NP.  Electronically Signed: Tula Wells, ED Scribe. 12/21/2014. 3:15 PM.    Chief Complaint  Patient presents with  . Possible Pregnancy   The history is provided by the patient. No language interpreter was used.   HPI Comments: Connie Wells is a 29 y.o. female [id number] with a history of ectopic pregnancy, herpes genitalia and chlamydia who presents to the Emergency Department for possible pregnancy after a positive urine pregnancy at home earlier this month. Pt states nausea as an associated symptom. Her nausea becomes worse with the smell of cigarettes and eating. Pt reports that she did not have menses this month. Her LMP was 9/26, which was later than normal for her. Pt has had 1 partner since her daughter was born 1 year ago. She would like to be tested for STDs because he has had one other partner in that time. Pt is not on birth control, but would like to be started on some form. Her last herpes outbreak was in 2005. She denies vomiting or genital lesions.  Past Medical History  Diagnosis Date  . Solitary kidney   . Herpes genitalia    Past Surgical History  Procedure Laterality Date  . Ectopic pregnancy surgery    . Dilation and evacuation     Family History  Problem Relation Age of Onset  . Diabetes Mother   . Hypertension Mother   . Diabetes Maternal Aunt   . Asthma Brother    Social History  Substance Use Topics  . Smoking status: Former Smoker -- 5 years    Types: Cigarettes  . Smokeless tobacco: Never Used  . Alcohol Use: No     Comment: Occassional   OB History    Gravida Para Term Preterm AB TAB SAB Ectopic Multiple Living   4 2 1 1 2 1  0 1 0 2     Review of Systems  Gastrointestinal: Positive for nausea. Negative for vomiting.   Genitourinary: Negative for vaginal bleeding and genital sores.  All other systems reviewed and are negative.  Allergies  Review of patient's allergies indicates no known allergies.  Home Medications   Prior to Admission medications   Medication Sig Start Date End Date Taking? Authorizing Provider  ibuprofen (ADVIL,MOTRIN) 600 MG tablet Take 1 tablet (600 mg total) by mouth every 6 (six) hours. Patient not taking: Reported on 12/21/2014 11/16/13   Josephine Cables, MD  Prenatal Vit-Fe Fumarate-FA (PRENATAL MULTIVITAMIN) TABS tablet Take 1 tablet by mouth daily at 12 noon. Patient not taking: Reported on 12/21/2014 11/11/13   Lavonia Drafts, MD  promethazine (PHENERGAN) 12.5 MG tablet Take 1 tablet (12.5 mg total) by mouth every 6 (six) hours as needed for nausea or vomiting. 12/21/14   Hope Bunnie Pion, NP   BP 121/63 mmHg  Pulse 76  Temp(Src) 98.1 F (36.7 C) (Oral)  Resp 18  SpO2 100% Physical Exam  Constitutional: She is oriented to person, place, and time. She appears well-developed and well-nourished. No distress.  HENT:  Head: Normocephalic and atraumatic.  Eyes: Conjunctivae and EOM are normal.  Neck: Normal range of motion. Neck supple. No tracheal deviation present.  Cardiovascular: Normal rate and regular rhythm.   Pulmonary/Chest: Effort normal and breath sounds normal. No respiratory distress.  Abdominal: Soft. Bowel  sounds are normal. There is no tenderness.  Genitourinary:  External genitalia without lesions, white d/c vaginal vault, cervix long, closed, no CMT, not adnexal tenderness, uterus enlarged approximately12 week size Chaperone (scribe) was present for exam which was performed with no discomfort or complications.   Musculoskeletal: Normal range of motion.  Neurological: She is alert and oriented to person, place, and time.  Skin: Skin is warm and dry.  Psychiatric: She has a normal mood and affect. Her behavior is normal.  Nursing note and vitals  reviewed.   ED Course  Procedures  Results for orders placed or performed during the hospital encounter of 12/21/14 (from the past 24 hour(s))  Basic metabolic panel     Status: Abnormal   Collection Time: 12/21/14  2:51 PM  Result Value Ref Range   Sodium 134 (L) 135 - 145 mmol/L   Potassium 3.9 3.5 - 5.1 mmol/L   Chloride 102 101 - 111 mmol/L   CO2 21 (L) 22 - 32 mmol/L   Glucose, Bld 95 65 - 99 mg/dL   BUN 5 (L) 6 - 20 mg/dL   Creatinine, Ser 0.52 0.44 - 1.00 mg/dL   Calcium 9.1 8.9 - 10.3 mg/dL   GFR calc non Af Amer >60 >60 mL/min   GFR calc Af Amer >60 >60 mL/min   Anion gap 11 5 - 15  CBC with Differential     Status: Abnormal   Collection Time: 12/21/14  2:51 PM  Result Value Ref Range   WBC 10.4 4.0 - 10.5 K/uL   RBC 3.44 (L) 3.87 - 5.11 MIL/uL   Hemoglobin 11.2 (L) 12.0 - 15.0 g/dL   HCT 32.4 (L) 36.0 - 46.0 %   MCV 94.2 78.0 - 100.0 fL   MCH 32.6 26.0 - 34.0 pg   MCHC 34.6 30.0 - 36.0 g/dL   RDW 13.7 11.5 - 15.5 %   Platelets 256 150 - 400 K/uL   Neutrophils Relative % 68 %   Neutro Abs 7.1 1.7 - 7.7 K/uL   Lymphocytes Relative 24 %   Lymphs Abs 2.5 0.7 - 4.0 K/uL   Monocytes Relative 7 %   Monocytes Absolute 0.7 0.1 - 1.0 K/uL   Eosinophils Relative 1 %   Eosinophils Absolute 0.1 0.0 - 0.7 K/uL   Basophils Relative 0 %   Basophils Absolute 0.0 0.0 - 0.1 K/uL  ABO/Rh     Status: None   Collection Time: 12/21/14  2:51 PM  Result Value Ref Range   ABO/RH(D) A POS    No rh immune globuloin NOT A RH IMMUNE GLOBULIN CANDIDATE, PT RH POSITIVE   hCG, quantitative, pregnancy     Status: Abnormal   Collection Time: 12/21/14  2:51 PM  Result Value Ref Range   hCG, Beta Chain, Quant, S 134742 (H) <5 mIU/mL  Urinalysis, Routine w reflex microscopic (not at [facility])     Status: Abnormal   Collection Time: 12/21/14  3:02 PM  Result Value Ref Range   Color, Urine YELLOW YELLOW   APPearance CLOUDY (A) CLEAR   Specific Gravity, Urine 1.007 1.005 - 1.030   pH 7.0 5.0 -  8.0   Glucose, UA NEGATIVE NEGATIVE mg/dL   Hgb urine dipstick NEGATIVE NEGATIVE   Bilirubin Urine NEGATIVE NEGATIVE   Ketones, ur NEGATIVE NEGATIVE mg/dL   Protein, ur NEGATIVE NEGATIVE mg/dL   Urobilinogen, UA 1.0 0.0 - 1.0 mg/dL   Nitrite NEGATIVE NEGATIVE   Leukocytes, UA LARGE (A) NEGATIVE  Urine microscopic-add  on     Status: Abnormal   Collection Time: 12/21/14  3:02 PM  Result Value Ref Range   Squamous Epithelial / LPF MANY (A) RARE   WBC, UA 11-20 <3 WBC/hpf   Bacteria, UA FEW (A) RARE  POC Urine Pregnancy, ED (do NOT order at [facility])     Status: Abnormal   Collection Time: 12/21/14  3:48 PM  Result Value Ref Range   Preg Test, Ur POSITIVE (A) NEGATIVE  Wet prep, genital     Status: Abnormal   Collection Time: 12/21/14  4:31 PM  Result Value Ref Range   Yeast Wet Prep HPF POC NONE SEEN NONE SEEN   Trich, Wet Prep NONE SEEN NONE SEEN   Clue Cells Wet Prep HPF POC FEW (A) NONE SEEN   WBC, Wet Prep HPF POC FEW (A) NONE SEEN    DIAGNOSTIC STUDIES: Oxygen Saturation is 100% on RA, normal by my interpretation.    COORDINATION OF CARE: 3:16 PM Discussed treatment plan with pt at bedside and pt agreed to plan.   Labs Review Imaging Review US Ob Comp Less 14 Wks  12/21/2014  CLINICAL DATA:  Abdominal/pelvic pain and nausea EXAM: OBSTETRIC <14 WK ULTRASOUND TECHNIQUE: Transabdominal ultrasound was performed for evaluation of the gestation as well as the maternal uterus and adnexal regions. COMPARISON:  None. FINDINGS: Intrauterine gestational sac: Visualized/normal in shape. Yolk sac:  Not visualized Embryo:  Visualized Cardiac Activity: Visualized Heart Rate: 150 bpm CRL:   51  mm   11 w 6 d                  [place] [facility]: Jul 06, 2015 Maternal uterus/adnexae: There is a focal 9 x 4 mm subchorionic hemorrhage. Cervical os is closed. There are no maternal extrauterine pelvic or adnexal masses. No maternal free pelvic fluid. IMPRESSION: Single live intrauterine gestation with estimated  gestational age of approximately 67 weeks. Position variable. Small subchorionic hemorrhage. Amniotic fluid volume appears normal for gestational age. Cervical os closed. Electronically Signed   By: Lowella Grip III M.D.   On: 12/21/2014 18:01   I have personally reviewed and evaluated these lab results as part of my medical decision-making.   MDM  29 y.o. female with nausea and vomiting in early pregnancy and hx of ectopic pregnancy. Stable for d/c with  Viable IUP. She will start her prenatal care and I will give Rx for Phenergan for her nausea. Discussed with the patient and all questioned fully answered. She will go to [facility] if any problems arise.  Final diagnoses:  Nausea and vomiting in pregnancy prior to [redacted] weeks gestation  Headache in pregnancy, first trimester   I personally performed the services described in this documentation, which was scribed in my presence. The recorded information has been reviewed and is accurate.   [place], NP 12/21/14 [place], MD 12/23/14 [phone]

## 2014-12-21 NOTE — ED Notes (Signed)
To us

## 2014-12-21 NOTE — Discharge Instructions (Signed)
Start your prenatal care. If you have problems go to [facility].

## 2014-12-22 LAB — GC/CHLAMYDIA PROBE AMP (~~LOC~~) NOT AT ARMC
Chlamydia: NEGATIVE
NEISSERIA GONORRHEA: NEGATIVE

## 2014-12-23 LAB — URINE CULTURE

## 2015-04-20 ENCOUNTER — Emergency Department (HOSPITAL_COMMUNITY): Payer: Medicaid Other

## 2015-04-20 ENCOUNTER — Inpatient Hospital Stay (HOSPITAL_COMMUNITY)
Admission: EM | Admit: 2015-04-20 | Discharge: 2015-04-23 | DRG: 153 | Disposition: A | Discharge status: Home / Self Care | Payer: Medicaid Other | Attending: Internal Medicine | Admitting: Internal Medicine

## 2015-04-20 ENCOUNTER — Encounter (HOSPITAL_COMMUNITY): Payer: Self-pay | Admitting: Emergency Medicine

## 2015-04-20 DIAGNOSIS — F1290 Cannabis use, unspecified, uncomplicated: Secondary | ICD-10-CM | POA: Diagnosis present

## 2015-04-20 DIAGNOSIS — Q602 Renal agenesis, unspecified: Secondary | ICD-10-CM

## 2015-04-20 DIAGNOSIS — J029 Acute pharyngitis, unspecified: Principal | ICD-10-CM | POA: Diagnosis present

## 2015-04-20 DIAGNOSIS — R1310 Dysphagia, unspecified: Secondary | ICD-10-CM | POA: Diagnosis present

## 2015-04-20 DIAGNOSIS — F17210 Nicotine dependence, cigarettes, uncomplicated: Secondary | ICD-10-CM | POA: Diagnosis present

## 2015-04-20 DIAGNOSIS — E079 Disorder of thyroid, unspecified: Secondary | ICD-10-CM | POA: Diagnosis present

## 2015-04-20 DIAGNOSIS — Q605 Renal hypoplasia, unspecified: Secondary | ICD-10-CM

## 2015-04-20 DIAGNOSIS — J391 Other abscess of pharynx: Secondary | ICD-10-CM

## 2015-04-20 LAB — I-STAT CHEM 8, ED
BUN: 8 mg/dL (ref 6–20)
CALCIUM ION: 1.22 mmol/L (ref 1.12–1.23)
CHLORIDE: 100 mmol/L — AB (ref 101–111)
Creatinine, Ser: 0.8 mg/dL (ref 0.44–1.00)
GLUCOSE: 106 mg/dL — AB (ref 65–99)
HCT: 45 % (ref 36.0–46.0)
Hemoglobin: 15.3 g/dL — ABNORMAL HIGH (ref 12.0–15.0)
Potassium: 4.3 mmol/L (ref 3.5–5.1)
Sodium: 140 mmol/L (ref 135–145)
TCO2: 26 mmol/L (ref 0–100)

## 2015-04-20 LAB — CBC WITH DIFFERENTIAL/PLATELET
Basophils Absolute: 0 10*3/uL (ref 0.0–0.1)
Basophils Relative: 0 %
EOS PCT: 0 %
Eosinophils Absolute: 0 10*3/uL (ref 0.0–0.7)
HCT: 38.7 % (ref 36.0–46.0)
HEMOGLOBIN: 12.8 g/dL (ref 12.0–15.0)
LYMPHS ABS: 1.9 10*3/uL (ref 0.7–4.0)
LYMPHS PCT: 10 %
MCH: 31.5 pg (ref 26.0–34.0)
MCHC: 33.1 g/dL (ref 30.0–36.0)
MCV: 95.3 fL (ref 78.0–100.0)
Monocytes Absolute: 1.5 10*3/uL — ABNORMAL HIGH (ref 0.1–1.0)
Monocytes Relative: 8 %
Neutro Abs: 16.1 10*3/uL — ABNORMAL HIGH (ref 1.7–7.7)
Neutrophils Relative %: 82 %
PLATELETS: 272 10*3/uL (ref 150–400)
RBC: 4.06 MIL/uL (ref 3.87–5.11)
RDW: 13.2 % (ref 11.5–15.5)
WBC: 19.5 10*3/uL — AB (ref 4.0–10.5)

## 2015-04-20 LAB — POC URINE PREG, ED: Preg Test, Ur: NEGATIVE

## 2015-04-20 LAB — RAPID STREP SCREEN (MED CTR MEBANE ONLY): Streptococcus, Group A Screen (Direct): NEGATIVE

## 2015-04-20 LAB — LACTIC ACID, PLASMA: Lactic Acid, Venous: 0.7 mmol/L (ref 0.5–2.0)

## 2015-04-20 MED ORDER — KETOROLAC TROMETHAMINE 30 MG/ML IJ SOLN
30.0000 mg | Freq: Once | INTRAMUSCULAR | Status: AC
  Administered 2015-04-20: 30 mg via INTRAVENOUS
  Filled 2015-04-20: qty 1

## 2015-04-20 MED ORDER — DEXAMETHASONE SODIUM PHOSPHATE 10 MG/ML IJ SOLN
10.0000 mg | Freq: Once | INTRAMUSCULAR | Status: AC
  Administered 2015-04-20: 10 mg via INTRAVENOUS
  Filled 2015-04-20: qty 1

## 2015-04-20 MED ORDER — CLINDAMYCIN PHOSPHATE 600 MG/50ML IV SOLN
600.0000 mg | Freq: Once | INTRAVENOUS | Status: AC
  Administered 2015-04-20: 600 mg via INTRAVENOUS
  Filled 2015-04-20: qty 50

## 2015-04-20 MED ORDER — SODIUM CHLORIDE 0.9 % IV BOLUS (SEPSIS)
1000.0000 mL | Freq: Once | INTRAVENOUS | Status: AC
  Administered 2015-04-20: 1000 mL via INTRAVENOUS

## 2015-04-20 MED ORDER — GADOBENATE DIMEGLUMINE 529 MG/ML IV SOLN
12.0000 mL | Freq: Once | INTRAVENOUS | Status: AC | PRN
  Administered 2015-04-20: 12 mL via INTRAVENOUS

## 2015-04-20 MED ORDER — FLUORESCEIN SODIUM 1 MG OP STRP: 1.0000 | ORAL_STRIP | Freq: Once | OPHTHALMIC | Status: DC

## 2015-04-20 MED ORDER — IOHEXOL 300 MG/ML  SOLN
100.0000 mL | Freq: Once | INTRAMUSCULAR | Status: AC | PRN
  Administered 2015-04-20: 100 mL via INTRAVENOUS

## 2015-04-20 MED ORDER — TETRACAINE HCL 0.5 % OP SOLN: 2.0000 [drp] | Freq: Once | OPHTHALMIC | Status: DC

## 2015-04-20 MED ORDER — MORPHINE SULFATE (PF) 4 MG/ML IV SOLN
4.0000 mg | Freq: Once | INTRAVENOUS | Status: AC
  Administered 2015-04-20: 4 mg via INTRAVENOUS
  Filled 2015-04-20: qty 1

## 2015-04-20 MED ORDER — ONDANSETRON HCL 4 MG/2ML IJ SOLN
4.0000 mg | Freq: Once | INTRAMUSCULAR | Status: AC
  Administered 2015-04-20: 4 mg via INTRAVENOUS
  Filled 2015-04-20: qty 2

## 2015-04-20 NOTE — ED Notes (Signed)
PT taken to MRI at this time

## 2015-04-20 NOTE — ED Notes (Signed)
ED PA at bedside

## 2015-04-20 NOTE — ED Notes (Signed)
PT remains in MRI.

## 2015-04-20 NOTE — ED Provider Notes (Signed)
CSN: [id number]     Arrival date & time 04/20/15  1334 History  By signing my name below, I, Essence Howell, attest that this documentation has been prepared under the direction and in the presence of Delsa Grana, PA-C Electronically Signed: Ladene Artist, ED Scribe 04/20/2015 at 3:52 PM.   Chief Complaint  Patient presents with  . Neck Pain   The history is provided by the patient. No language interpreter was used.   HPI Comments: Connie Wells is a 30 y.o. female who presents to the Emergency Department complaining left anterior neck pain and difficulty swallowing that has been progressive for the past two days. Pt states that symptoms started as a scratchy sore throat Tuesday night.  She felt like she had to frequently clear her throat, but she did not have any other associated sx until later than night.  She had gradually worsening left neck pain, painful swallowing and difficulty swallowing over the past two days with associated sweats and hot and cold chills and intermittent random stabbing pain in her chest.  Yesterday it was very painful to talk, today that is better, however when she woke up today, her neck pain was much more severe, sharp, rated 8/10 without radiation, worse with palpation of neck and extension or rotation of neck.  She states she cannot lay flat secondary to pain.  She tried to eat pizza just PTA, but states it felt stuck in her throat, for a while, but she choked it down.  She now cannot swallow her secretions.  She denies posterior neck pain and had normal ROM of neck until them morning secondary to pain and swelling.       Pt denies fever, URI sx, ear pain, trismus, SOB.  No h/o GERD, neck injury or thyroid disease. No IVDU.  Patient denies any medical history. She denies any known drug allergies.  Past Medical History  Diagnosis Date  . Solitary kidney   . Herpes genitalia    Past Surgical History  Procedure Laterality Date  . Ectopic pregnancy surgery     . Dilation and evacuation     Family History  Problem Relation Age of Onset  . Diabetes Mother   . Hypertension Mother   . Diabetes Maternal Aunt   . Asthma Brother    Social History  Substance Use Topics  . Smoking status: Current Every Day Smoker -- 0.50 packs/day for 5 years    Types: Cigarettes  . Smokeless tobacco: Never Used  . Alcohol Use: No     Comment: Occassional   OB History    Gravida Para Term Preterm AB TAB SAB Ectopic Multiple Living   4 2 1 1 2 1  0 1 0 2     Review of Systems  Constitutional: Positive for chills and diaphoresis. Negative for fever, activity change, appetite change, fatigue and unexpected weight change.  HENT: Positive for sore throat and trouble swallowing. Negative for congestion, drooling, ear discharge, ear pain, facial swelling, hearing loss, mouth sores, postnasal drip, rhinorrhea, sinus pressure, sneezing, tinnitus and voice change.   Eyes: Negative.   Respiratory: Positive for choking. Negative for cough, chest tightness, shortness of breath, wheezing and stridor.   Cardiovascular: Positive for chest pain. Negative for palpitations and leg swelling.  Gastrointestinal: Negative for nausea, vomiting, abdominal pain, diarrhea and constipation.  Endocrine: Negative.   Genitourinary: Negative.   Musculoskeletal: Positive for neck pain. Negative for myalgias, back pain, joint swelling, arthralgias and neck stiffness.  Skin: Negative.  Negative for color change, pallor and rash.  Neurological: Negative for dizziness, syncope, facial asymmetry, speech difficulty, weakness, light-headedness, numbness and headaches.  Hematological: Negative.   Psychiatric/Behavioral: Negative.   All other systems reviewed and are negative.  Allergies  Review of patient's allergies indicates no known allergies.  Home Medications   Prior to Admission medications   Medication Sig Start Date End Date Taking? Authorizing Provider  ibuprofen (ADVIL,MOTRIN) 600  MG tablet Take 1 tablet (600 mg total) by mouth every 6 (six) hours. Patient not taking: Reported on 12/21/2014 11/16/13   Josephine Cables, MD  Prenatal Vit-Fe Fumarate-FA (PRENATAL MULTIVITAMIN) TABS tablet Take 1 tablet by mouth daily at 12 noon. Patient not taking: Reported on 12/21/2014 11/11/13   Lavonia Drafts, MD  promethazine (PHENERGAN) 12.5 MG tablet Take 1 tablet (12.5 mg total) by mouth every 6 (six) hours as needed for nausea or vomiting. 12/21/14   Hope Bunnie Pion, NP   BP 108/73 mmHg  Pulse 112  Temp(Src) 99.3 F (37.4 C) (Oral)  Resp 18  Wt 130 lb 1 oz (58.996 kg)  SpO2 100% Physical Exam  Constitutional: She is oriented to person, place, and time. She appears well-developed and well-nourished. No distress.  HENT:  Head: Normocephalic and atraumatic.  Right Ear: Tympanic membrane and external ear normal.  Left Ear: Tympanic membrane and external ear normal.  Nose: Nose normal. No mucosal edema or rhinorrhea. Right sinus exhibits no maxillary sinus tenderness and no frontal sinus tenderness. Left sinus exhibits no maxillary sinus tenderness and no frontal sinus tenderness.  Mouth/Throat: Uvula is midline, oropharynx is clear and moist and mucous membranes are normal. Mucous membranes are not pale, not dry and not cyanotic. No trismus in the jaw. No uvula swelling. No oropharyngeal exudate, posterior oropharyngeal edema, posterior oropharyngeal erythema or tonsillar abscesses.  No sublingual tenderness  Eyes: Conjunctivae, EOM and lids are normal. Pupils are equal, round, and reactive to light. Right eye exhibits no discharge. Left eye exhibits no discharge. No scleral icterus.  Neck: Phonation normal. No JVD present. Tracheal tenderness present. No spinous process tenderness and no muscular tenderness present. Tracheal deviation, edema and decreased range of motion present. No erythema present. Thyroid mass present. No thyromegaly present.  Neck asymmetrical with fullness  and firmness to left mid anterior neck. Tenderness to palpation over left SCM and inferior omohyoid, diffusely tender over left neck.  No palpated left cervical adenopathy Limited rotation and extension of neck, normal flexion  No sublingual tenderness, no submandibular, submental or subtonsillar lymphedema No erythema, no crepitus palpated No cervical spinal tenderness to palpation along the spinal processes or paraspinal muscles, new tenderness to palpation of trapezius  Cardiovascular: Regular rhythm, normal heart sounds, intact distal pulses and normal pulses.  Tachycardia present.  Exam reveals no gallop and no friction rub.   No murmur heard. Pulmonary/Chest: Effort normal and breath sounds normal. No accessory muscle usage or stridor. No respiratory distress. She has no decreased breath sounds. She has no wheezes. She has no rhonchi. She has no rales. She exhibits no tenderness.  Abdominal: Soft. Normal appearance and bowel sounds are normal. She exhibits no distension and no mass. There is no tenderness. There is no rebound and no guarding.  Musculoskeletal: She exhibits no edema or tenderness.  Lymphadenopathy:       Head (right side): No submental, no submandibular, no preauricular and no posterior auricular adenopathy present.       Head (left side): No submental, no submandibular, no preauricular and no  posterior auricular adenopathy present.    She has no cervical adenopathy.  Neurological: She is alert and oriented to person, place, and time. She has normal reflexes. No cranial nerve deficit. She exhibits normal muscle tone. Coordination normal.  Skin: Skin is warm and dry. No rash noted. She is not diaphoretic. No erythema. No pallor.  Psychiatric: She has a normal mood and affect. Her behavior is normal. Judgment and thought content normal.  Nursing note and vitals reviewed.  ED Course  Procedures (including critical care time) DIAGNOSTIC STUDIES: Oxygen Saturation is 100% on  RA, normal by my interpretation.    COORDINATION OF CARE: 2:31 PM-Discussed treatment plan which includes CXR, CT neck, CBC, I-stat, strep screen/culture and Toradol injection with pt at bedside and pt agreed to plan.    Labs Review Labs Reviewed  CBC WITH DIFFERENTIAL/PLATELET - Abnormal; Notable for the following:    WBC 19.5 (*)    Neutro Abs 16.1 (*)    Monocytes Absolute 1.5 (*)    All other components within normal limits  I-STAT CHEM 8, ED - Abnormal; Notable for the following:    Chloride 100 (*)    Glucose, Bld 106 (*)    Hemoglobin 15.3 (*)    All other components within normal limits  RAPID STREP SCREEN (NOT AT [facility])  CULTURE, GROUP A STREP ([place])  CULTURE, BLOOD (ROUTINE X 2)  CULTURE, BLOOD (ROUTINE X 2)  LACTIC ACID, PLASMA   Imaging Review Dg Chest 2 View  04/20/2015  CLINICAL DATA:  30 year old with sore throat for 2 days. EXAM: CHEST  2 VIEW COMPARISON:  Radiographs 05/12/2006 FINDINGS: The heart size and mediastinal contours are normal. The lungs are clear. There is no pleural effusion or pneumothorax. No acute osseous findings are identified. IMPRESSION: Stable chest.  No active cardiopulmonary process. Electronically Signed   By: Richardean Sale M.D.   On: 04/20/2015 15:36   Ct Soft Tissue Neck W Contrast  04/20/2015  CLINICAL DATA:  30 year old female with neck pain swelling, dysphagia and limited range of motion for 2 days. Initial encounter. EXAM: CT NECK WITH CONTRAST TECHNIQUE: Multidetector CT imaging of the neck was performed using the standard protocol following the bolus administration of intravenous contrast. CONTRAST:  126mL OMNIPAQUE IOHEXOL 300 MG/ML  SOLN COMPARISON:  None. FINDINGS: Pharynx and larynx: Large retropharyngeal space effusion, with fluid tracking laterally to involve both carotid spaces, more so the left. From the left carotid space fluid also tracks anteriorly toward the left submandibular space (series 5 image 38). The fusion is maximal at  the level of the hypopharynx corresponding to the C3 vertebral level, measuring nearly 10 mm AP. No rim enhancement. The nasopharynx appears normal. There is no definite oropharyngeal tonsillar enlargement or soft tissue thickening. The hypopharynx is effaced, but without definite pharyngeal soft tissue thickening. The epiglottis appears normal. The glottis is closed. Salivary glands: Fluid in the posterior aspect of the left submandibular space as described above, but both submandibular glands appear within normal limits. Negative sublingual space. Negative parotid spaces. Thyroid: Enlarged left lobe of the thyroid containing a 04/16/2018 5 mm diameter hypodense nodule which appears contiguous with the posterior capsule of the left lobe. There is fluid surrounding the left lobe of the thyroid (series 5, image 50) contiguous with the effusion described above, and there is heterogeneous hyperdensity along the posterior superior margin of the left thyroid lobe (series 5, image 49). The isthmus and right lobe have a more normal appearance. There is a small  volume of fluid along the superior aspect of the right thyroid lobe. There is a mild degree of generalized thyromegaly. Lymph nodes: No cervical lymphadenopathy. No hyper enhancing or cystic lymph nodes are identified. Vascular: Major vascular structures in the neck and at the skullbase, including the left IJ remain patent. The left IJ is mildly effaced at the level of the thyroid. The right IJ appears dominant. The left vertebral artery appears dominant. Limited intracranial: Negative. Visualized orbits: Negative. Mastoids and visualized paranasal sinuses: Minimal maxillary sinus alveolar recess mucosal thickening, otherwise clear. Skeleton: No longus coli muscle calcifications. The longus coli muscles do not appear enlarged. Normal CT appearance of the cervical spine. Mild degenerative changes at the right TMJ. No acute osseous abnormality identified. Upper chest:  Visualized superior mediastinum appears within normal limits; there is a small volume of residual thymus. Negative lung apices. Incidental occasional small new mat assessed in the left lung (Series 6, image 37). Bilateral axillary lymph nodes are somewhat numerous but within normal limits for age. IMPRESSION: 1. Relatively large trans spatial fluid collection in the deep neck, centered at the retropharyngeal space, but also involving the left greater than right carotid spaces, space around the left thyroid, and posterior left submandibular space. 2. No convincing soft tissue thickening or tonsillar abnormality in the pharynx. No CT changes of cervical discitis osteomyelitis. No changes of chronic longus coli tendonitis. No cervical lymphadenopathy. 3. There is a 2-2.5 cm round low-density cystic lesion expanding the left lobe of the thyroid, and appearing somewhat contiguous with the abnormal fluid in #1. 4. Top differential considerations include acute infection, spontaneous hemorrhage, angioedema/3rd spacing of fluid. 5. Recommend cervical spine MRI without and with contrast to tried a narrow the differential diagnosis. This was discussed by telephone with ED Provider Jojo Geving PA-C on 04/20/2015 at 16:52 . Electronically Signed   By: Genevie Ann M.D.   On: 04/20/2015 17:00   I have personally reviewed and evaluated these images and lab results as part of my medical decision-making.   EKG Interpretation None      MDM   30 year old female, otherwise healthy with gradual onset of neck pain, dysphagia and dys Patient has noticeable asymmetry to her neck and is extremely tender. She is maintaining her airway CT neck with contrast obtained with basic labs and rapid strep  Patient has a leukocytosis of 19.5, CT scan pertinent neck deep space fluid, largest collection retropharyngeal Dr. Nevada Crane called with his breathing and recommendations for further workup. He recommends MRI cervical spine with and without  contrast.   MRI was ordered and the patient was signed out to [employer], Dr. Tamera Punt has also seen and evaluated the patient personally and is aware of the CT results in plan.  CXR in the ER under my care the patient was given Toradol, Decadron, morphine, Zofran and 1 L of IV fluids.  There is concern for infectious sources with leukocytosis, so blood cultures, lactic acid and antibiotics have been ordered.    The patient was updated on CT results and plan, she states that she has improvement with Toradol and Decadron and is now able to swallow secretions, pain is minimally improved.  She is still maintaining her airway has no respiratory distress. She initially presented with tachycardia but has improved with pain meds and fluids, other vital signs stable.    Final diagnoses:  None   I personally performed the services described in this documentation, which was scribed in my presence. The recorded information  has been reviewed and is accurate.     Delsa Grana, PA-C 04/20/15 [place], MD 04/20/15 1806

## 2015-04-20 NOTE — ED Provider Notes (Signed)
Patient signed out to me at shift change pending MRI of the neck. Patient with Synthroid for a couple of days, now having difficulty swallowing. Palpable mass to the left side of the neck. Patient is afebrile, nontoxic appearing. No difficulty breathing or swallowing at this time.   Results for orders placed or performed during the hospital encounter of 04/20/15  Rapid strep screen  Result Value Ref Range   Streptococcus, Group A Screen (Direct) NEGATIVE NEGATIVE  CBC with Differential  Result Value Ref Range   WBC 19.5 (H) 4.0 - 10.5 K/uL   RBC 4.06 3.87 - 5.11 MIL/uL   Hemoglobin 12.8 12.0 - 15.0 g/dL   HCT 38.7 36.0 - 46.0 %   MCV 95.3 78.0 - 100.0 fL   MCH 31.5 26.0 - 34.0 pg   MCHC 33.1 30.0 - 36.0 g/dL   RDW 13.2 11.5 - 15.5 %   Platelets 272 150 - 400 K/uL   Neutrophils Relative % 82 %   Neutro Abs 16.1 (H) 1.7 - 7.7 K/uL   Lymphocytes Relative 10 %   Lymphs Abs 1.9 0.7 - 4.0 K/uL   Monocytes Relative 8 %   Monocytes Absolute 1.5 (H) 0.1 - 1.0 K/uL   Eosinophils Relative 0 %   Eosinophils Absolute 0.0 0.0 - 0.7 K/uL   Basophils Relative 0 %   Basophils Absolute 0.0 0.0 - 0.1 K/uL  Lactic acid, plasma  Result Value Ref Range   Lactic Acid, Venous 0.7 0.5 - 2.0 mmol/L  I-stat chem 8, ed  Result Value Ref Range   Sodium 140 135 - 145 mmol/L   Potassium 4.3 3.5 - 5.1 mmol/L   Chloride 100 (L) 101 - 111 mmol/L   BUN 8 6 - 20 mg/dL   Creatinine, Ser 0.80 0.44 - 1.00 mg/dL   Glucose, Bld 106 (H) 65 - 99 mg/dL   Calcium, Ion 1.22 1.12 - 1.23 mmol/L   TCO2 26 0 - 100 mmol/L   Hemoglobin 15.3 (H) 12.0 - 15.0 g/dL   HCT 45.0 36.0 - 46.0 %   Dg Chest 2 View  04/20/2015  CLINICAL DATA:  30 year old with sore throat for 2 days. EXAM: CHEST  2 VIEW COMPARISON:  Radiographs 05/12/2006 FINDINGS: The heart size and mediastinal contours are normal. The lungs are clear. There is no pleural effusion or pneumothorax. No acute osseous findings are identified. IMPRESSION: Stable chest.   No active cardiopulmonary process. Electronically Signed   By: Richardean Sale M.D.   On: 04/20/2015 15:36   Ct Soft Tissue Neck W Contrast  04/20/2015  CLINICAL DATA:  30 year old female with neck pain swelling, dysphagia and limited range of motion for 2 days. Initial encounter. EXAM: CT NECK WITH CONTRAST TECHNIQUE: Multidetector CT imaging of the neck was performed using the standard protocol following the bolus administration of intravenous contrast. CONTRAST:  12mL OMNIPAQUE IOHEXOL 300 MG/ML  SOLN COMPARISON:  None. FINDINGS: Pharynx and larynx: Large retropharyngeal space effusion, with fluid tracking laterally to involve both carotid spaces, more so the left. From the left carotid space fluid also tracks anteriorly toward the left submandibular space (series 5 image 38). The fusion is maximal at the level of the hypopharynx corresponding to the C3 vertebral level, measuring nearly 10 mm AP. No rim enhancement. The nasopharynx appears normal. There is no definite oropharyngeal tonsillar enlargement or soft tissue thickening. The hypopharynx is effaced, but without definite pharyngeal soft tissue thickening. The epiglottis appears normal. The glottis is closed. Salivary glands: Fluid in  the posterior aspect of the left submandibular space as described above, but both submandibular glands appear within normal limits. Negative sublingual space. Negative parotid spaces. Thyroid: Enlarged left lobe of the thyroid containing a 04/16/2018 5 mm diameter hypodense nodule which appears contiguous with the posterior capsule of the left lobe. There is fluid surrounding the left lobe of the thyroid (series 5, image 50) contiguous with the effusion described above, and there is heterogeneous hyperdensity along the posterior superior margin of the left thyroid lobe (series 5, image 49). The isthmus and right lobe have a more normal appearance. There is a small volume of fluid along the superior aspect of the right  thyroid lobe. There is a mild degree of generalized thyromegaly. Lymph nodes: No cervical lymphadenopathy. No hyper enhancing or cystic lymph nodes are identified. Vascular: Major vascular structures in the neck and at the skullbase, including the left IJ remain patent. The left IJ is mildly effaced at the level of the thyroid. The right IJ appears dominant. The left vertebral artery appears dominant. Limited intracranial: Negative. Visualized orbits: Negative. Mastoids and visualized paranasal sinuses: Minimal maxillary sinus alveolar recess mucosal thickening, otherwise clear. Skeleton: No longus coli muscle calcifications. The longus coli muscles do not appear enlarged. Normal CT appearance of the cervical spine. Mild degenerative changes at the right TMJ. No acute osseous abnormality identified. Upper chest: Visualized superior mediastinum appears within normal limits; there is a small volume of residual thymus. Negative lung apices. Incidental occasional small new mat assessed in the left lung (Series 6, image 37). Bilateral axillary lymph nodes are somewhat numerous but within normal limits for age. IMPRESSION: 1. Relatively large trans spatial fluid collection in the deep neck, centered at the retropharyngeal space, but also involving the left greater than right carotid spaces, space around the left thyroid, and posterior left submandibular space. 2. No convincing soft tissue thickening or tonsillar abnormality in the pharynx. No CT changes of cervical discitis osteomyelitis. No changes of chronic longus coli tendonitis. No cervical lymphadenopathy. 3. There is a 2-2.5 cm round low-density cystic lesion expanding the left lobe of the thyroid, and appearing somewhat contiguous with the abnormal fluid in #1. 4. Top differential considerations include acute infection, spontaneous hemorrhage, angioedema/3rd spacing of fluid. 5. Recommend cervical spine MRI without and with contrast to tried a narrow the  differential diagnosis. This was discussed by telephone with ED Provider LEISA TAPIA PA-C on 04/20/2015 at 16:52 . Electronically Signed   By: Genevie Ann M.D.   On: 04/20/2015 17:00   Mr Cervical Spine W Wo Contrast  04/20/2015  CLINICAL DATA:  30 year old female with several days of acute neck pain and dysphagia. Abnormal neck CT with IV contrast, raising the possibility of retropharyngeal or cervical spine infection 1618 hours today. EXAM: MRI CERVICAL SPINE WITHOUT AND WITH CONTRAST TECHNIQUE: Multiplanar and multiecho pulse sequences of the cervical spine, to include the craniocervical junction and cervicothoracic junction, were obtained according to standard protocol without and with intravenous contrast. CONTRAST:  54mL MULTIHANCE GADOBENATE DIMEGLUMINE 529 MG/ML IV SOLN COMPARISON:  Neck CT today reported separately. FINDINGS: Mild reversal of cervical lordosis. No marrow edema or abnormal bone marrow signal in the cervical spine. No abnormal signal in the longus coli muscles. No cervical spine abnormal dural enhancement or epidural inflammation. Normal for age cervical and upper thoracic intervertebral disc signal. Cervicomedullary junction is within normal limits. Negative visualized posterior fossa structures. Spinal cord signal is within normal limits at all visualized levels. No cervical spinal stenosis.  No abnormal intradural enhancement. The extensive trans spatial low-density which was thought related to fluid on the earlier neck CT demonstrates diffuse hyperenhancement, indicating this is trans spatial inflammation and/or soft tissue inflammation (cellulitis like). This same distribution of changes is observed as on the earlier CT, with retropharyngeal space predominance, but also significant involvement around the left thyroid lobe (series 3, image 18) where a 22 mm diameter round thyroid nodule is re- demonstrated. The dependent aspect of this nodule demonstrates loss of gradient echo imaging  (series 7, image 31), and almost all of the nodule demonstrates intrinsic T1 hyperintensity. Elsewhere there is trace loss of gradient signal amidst the inflammation situated between the retropharynx and left carotid space (series 7, image 20). The larynx and pharynx soft tissue contours remain normal, with no pharyngeal soft tissue thickening or hyper enhancement. The sublingual space remains unaffected. However, the inflammatory changes are tracking into the left superior mediastinum (series 3, image 15). Negative lung apices. IMPRESSION: 1. This study is negative for: Cervical spine infection, abnormality of the longus coli muscles, or changes of acute pharyngeal infection. 2. However, the study is positive for extensive retropharyngeal inflammation - an acute retropharyngeal cellulitis appearance - that also tracks inferiorly surrounding the left thyroid lobe, and continues into the superior mediastinum. Despite the low density on the earlier neck CT, there is no fluid or fluid collection in the neck. 3. This is an unusual finding, and the etiology remains unclear. The patient has had symptoms for several days, has acute leukocytosis on presentation, but by report is afebrile and nontoxic appearing. I discussed the findings and possible etiologies including both infectious and noninfectious inflammation by telephone with Jeannett Senior, PA-C in the ED. Electronically Signed   By: Genevie Ann M.D.   On: 04/20/2015 21:40    11:24 PM MRI is as discussed above. I spoke with Dr. Constance Holster with ear nose throat, who looked at MRI as well, instructed to give antibiotics, he will see patient in the morning. Spoke with try it, they will admit patient. Patient already received clindamycin for the infection and Decadron for the inflammation. All results were discussed with patient and her family. She agrees with the plan.  Filed Vitals:   04/20/15 1410 04/20/15 1634 04/20/15 1946 04/20/15 2253  BP: 108/73 103/64 96/60  94/62  Pulse: 112 93 86 75  Temp: 99.3 F (37.4 C) 98.5 F (36.9 C) 98.5 F (36.9 C) 98.2 F (36.8 C)  TempSrc: Oral Oral Oral Oral  Resp: 18 18 18 16   Weight: 58.996 kg     SpO2: 100% 100% 100% 100%     Jeannett Senior, PA-C 04/20/15 [place], MD 04/22/15 [id number]

## 2015-04-20 NOTE — ED Notes (Signed)
PT reports a sore throat that started 2 days ago. PT reports she now has pain at the base of the left side of her neck. PT reports she feels as though she has a knot there.

## 2015-04-20 NOTE — ED Notes (Signed)
Lab at bedside

## 2015-04-21 ENCOUNTER — Encounter (HOSPITAL_COMMUNITY): Payer: Self-pay | Admitting: Internal Medicine

## 2015-04-21 DIAGNOSIS — J029 Acute pharyngitis, unspecified: Secondary | ICD-10-CM | POA: Diagnosis not present

## 2015-04-21 DIAGNOSIS — J391 Other abscess of pharynx: Secondary | ICD-10-CM | POA: Insufficient documentation

## 2015-04-21 DIAGNOSIS — Q605 Renal hypoplasia, unspecified: Secondary | ICD-10-CM | POA: Diagnosis not present

## 2015-04-21 DIAGNOSIS — F17210 Nicotine dependence, cigarettes, uncomplicated: Secondary | ICD-10-CM | POA: Diagnosis present

## 2015-04-21 DIAGNOSIS — J392 Other diseases of pharynx: Secondary | ICD-10-CM

## 2015-04-21 DIAGNOSIS — R1310 Dysphagia, unspecified: Secondary | ICD-10-CM | POA: Diagnosis present

## 2015-04-21 DIAGNOSIS — E079 Disorder of thyroid, unspecified: Secondary | ICD-10-CM | POA: Diagnosis present

## 2015-04-21 DIAGNOSIS — Q602 Renal agenesis, unspecified: Secondary | ICD-10-CM | POA: Diagnosis not present

## 2015-04-21 DIAGNOSIS — F1290 Cannabis use, unspecified, uncomplicated: Secondary | ICD-10-CM | POA: Diagnosis present

## 2015-04-21 LAB — COMPREHENSIVE METABOLIC PANEL
ALK PHOS: 46 U/L (ref 38–126)
ALT: 11 U/L — ABNORMAL LOW (ref 14–54)
ANION GAP: 9 (ref 5–15)
AST: 14 U/L — ABNORMAL LOW (ref 15–41)
Albumin: 2.8 g/dL — ABNORMAL LOW (ref 3.5–5.0)
BUN: 9 mg/dL (ref 6–20)
CO2: 22 mmol/L (ref 22–32)
Calcium: 8.7 mg/dL — ABNORMAL LOW (ref 8.9–10.3)
Chloride: 106 mmol/L (ref 101–111)
Creatinine, Ser: 0.91 mg/dL (ref 0.44–1.00)
GLUCOSE: 149 mg/dL — AB (ref 65–99)
Potassium: 4.4 mmol/L (ref 3.5–5.1)
SODIUM: 137 mmol/L (ref 135–145)
TOTAL PROTEIN: 7.2 g/dL (ref 6.5–8.1)
Total Bilirubin: 0.3 mg/dL (ref 0.3–1.2)

## 2015-04-21 LAB — HIV ANTIBODY (ROUTINE TESTING W REFLEX): HIV SCREEN 4TH GENERATION: NONREACTIVE

## 2015-04-21 LAB — MRSA PCR SCREENING: MRSA BY PCR: NEGATIVE

## 2015-04-21 MED ORDER — SODIUM CHLORIDE 0.9 % IV SOLN
INTRAVENOUS | Status: DC
  Administered 2015-04-21 (×2): via INTRAVENOUS

## 2015-04-21 MED ORDER — VANCOMYCIN HCL IN DEXTROSE 750-5 MG/150ML-% IV SOLN
750.0000 mg | Freq: Two times a day (BID) | INTRAVENOUS | Status: DC
  Administered 2015-04-21 – 2015-04-23 (×6): 750 mg via INTRAVENOUS
  Filled 2015-04-21 (×8): qty 150

## 2015-04-21 MED ORDER — ACETAMINOPHEN 650 MG RE SUPP: 650.0000 mg | Freq: Four times a day (QID) | RECTAL | Status: DC | PRN

## 2015-04-21 MED ORDER — ONDANSETRON HCL 4 MG/2ML IJ SOLN: 4.0000 mg | Freq: Four times a day (QID) | INTRAMUSCULAR | Status: DC | PRN

## 2015-04-21 MED ORDER — ONDANSETRON HCL 4 MG PO TABS: 4.0000 mg | ORAL_TABLET | Freq: Four times a day (QID) | ORAL | Status: DC | PRN

## 2015-04-21 MED ORDER — ENOXAPARIN SODIUM 40 MG/0.4ML ~~LOC~~ SOLN
40.0000 mg | SUBCUTANEOUS | Status: DC
  Administered 2015-04-21: 40 mg via SUBCUTANEOUS
  Filled 2015-04-21 (×2): qty 0.4

## 2015-04-21 MED ORDER — SODIUM CHLORIDE 0.9 % IV SOLN
1.5000 g | Freq: Four times a day (QID) | INTRAVENOUS | Status: DC
  Administered 2015-04-21 – 2015-04-23 (×9): 1.5 g via INTRAVENOUS
  Filled 2015-04-21 (×16): qty 1.5

## 2015-04-21 MED ORDER — ACETAMINOPHEN 325 MG PO TABS
650.0000 mg | ORAL_TABLET | Freq: Four times a day (QID) | ORAL | Status: DC | PRN
  Administered 2015-04-21 – 2015-04-23 (×3): 650 mg via ORAL
  Filled 2015-04-21 (×3): qty 2

## 2015-04-21 NOTE — Consult Note (Signed)
Reason for Consult: Neck swelling Referring Physician: Rise Patience, MD  Connie Wells is an 30 y.o. female.  HPI: Rapidly progressing swelling of the neck, anterior lower neck and the lateral neck with trouble swallowing and severe sore throat over the past couple of days. Since admission to the hospital late last night she is feeling 90% better. No prior history related to this.  Past Medical History  Diagnosis Date  . Solitary kidney   . Herpes genitalia     Past Surgical History  Procedure Laterality Date  . Ectopic pregnancy surgery    . Dilation and evacuation      Family History  Problem Relation Age of Onset  . Diabetes Mother   . Hypertension Mother   . Diabetes Maternal Aunt   . Asthma Brother     Social History:  reports that she has been smoking Cigarettes.  She has a 2.5 pack-year smoking history. She has never used smokeless tobacco. She reports that she uses illicit drugs (Marijuana). She reports that she does not drink alcohol.  Allergies: No Known Allergies  Medications: Reviewed  Results for orders placed or performed during the hospital encounter of 04/20/15 (from the past 48 hour(s))  Rapid strep screen     Status: None   Collection Time: 04/20/15  2:50 PM  Result Value Ref Range   Streptococcus, Group A Screen (Direct) NEGATIVE NEGATIVE    Comment: (NOTE) A Rapid Antigen test may result negative if the antigen level in the sample is below the detection level of this test. The FDA has not cleared this test as a stand-alone test therefore the rapid antigen negative result has reflexed to a Group A Strep culture.   CBC with Differential     Status: Abnormal   Collection Time: 04/20/15  2:54 PM  Result Value Ref Range   WBC 19.5 (H) 4.0 - 10.5 K/uL   RBC 4.06 3.87 - 5.11 MIL/uL   Hemoglobin 12.8 12.0 - 15.0 g/dL   HCT 38.7 36.0 - 46.0 %   MCV 95.3 78.0 - 100.0 fL   MCH 31.5 26.0 - 34.0 pg   MCHC 33.1 30.0 - 36.0 g/dL   RDW 13.2  11.5 - 15.5 %   Platelets 272 150 - 400 K/uL   Neutrophils Relative % 82 %   Neutro Abs 16.1 (H) 1.7 - 7.7 K/uL   Lymphocytes Relative 10 %   Lymphs Abs 1.9 0.7 - 4.0 K/uL   Monocytes Relative 8 %   Monocytes Absolute 1.5 (H) 0.1 - 1.0 K/uL   Eosinophils Relative 0 %   Eosinophils Absolute 0.0 0.0 - 0.7 K/uL   Basophils Relative 0 %   Basophils Absolute 0.0 0.0 - 0.1 K/uL  I-stat chem 8, ed     Status: Abnormal   Collection Time: 04/20/15  3:03 PM  Result Value Ref Range   Sodium 140 135 - 145 mmol/L   Potassium 4.3 3.5 - 5.1 mmol/L   Chloride 100 (L) 101 - 111 mmol/L   BUN 8 6 - 20 mg/dL   Creatinine, Ser 0.80 0.44 - 1.00 mg/dL   Glucose, Bld 106 (H) 65 - 99 mg/dL   Calcium, Ion 1.22 1.12 - 1.23 mmol/L   TCO2 26 0 - 100 mmol/L   Hemoglobin 15.3 (H) 12.0 - 15.0 g/dL   HCT 45.0 36.0 - 46.0 %  Lactic acid, plasma     Status: None   Collection Time: 04/20/15  6:21 PM  Result Value  Ref Range   Lactic Acid, Venous 0.7 0.5 - 2.0 mmol/L  POC Urine Pregnancy, ED (do NOT order at [facility])     Status: None   Collection Time: 04/20/15 11:17 PM  Result Value Ref Range   Preg Test, Ur NEGATIVE NEGATIVE    Comment:        THE SENSITIVITY OF THIS METHODOLOGY IS >24 mIU/mL   Comprehensive metabolic panel     Status: Abnormal   Collection Time: 04/21/15  4:22 AM  Result Value Ref Range   Sodium 137 135 - 145 mmol/L   Potassium 4.4 3.5 - 5.1 mmol/L   Chloride 106 101 - 111 mmol/L   CO2 22 22 - 32 mmol/L   Glucose, Bld 149 (H) 65 - 99 mg/dL   BUN 9 6 - 20 mg/dL   Creatinine, Ser 0.91 0.44 - 1.00 mg/dL   Calcium 8.7 (L) 8.9 - 10.3 mg/dL   Total Protein 7.2 6.5 - 8.1 g/dL   Albumin 2.8 (L) 3.5 - 5.0 g/dL   AST 14 (L) 15 - 41 U/L   ALT 11 (L) 14 - 54 U/L   Alkaline Phosphatase 46 38 - 126 U/L   Total Bilirubin 0.3 0.3 - 1.2 mg/dL   GFR calc non Af Amer >60 >60 mL/min   GFR calc Af Amer >60 >60 mL/min    Comment: (NOTE) The eGFR has been calculated using the CKD EPI equation. This  calculation has not been validated in all clinical situations. eGFR's persistently <60 mL/min signify possible Chronic Kidney Disease.    Anion gap 9 5 - 15    Dg Chest 2 View  04/20/2015  CLINICAL DATA:  30 year old with sore throat for 2 days. EXAM: CHEST  2 VIEW COMPARISON:  Radiographs 05/12/2006 FINDINGS: The heart size and mediastinal contours are normal. The lungs are clear. There is no pleural effusion or pneumothorax. No acute osseous findings are identified. IMPRESSION: Stable chest.  No active cardiopulmonary process. Electronically Signed   By: Richardean Sale M.D.   On: 04/20/2015 15:36   Ct Soft Tissue Neck W Contrast  04/20/2015  CLINICAL DATA:  30 year old female with neck pain swelling, dysphagia and limited range of motion for 2 days. Initial encounter. EXAM: CT NECK WITH CONTRAST TECHNIQUE: Multidetector CT imaging of the neck was performed using the standard protocol following the bolus administration of intravenous contrast. CONTRAST:  141m OMNIPAQUE IOHEXOL 300 MG/ML  SOLN COMPARISON:  None. FINDINGS: Pharynx and larynx: Large retropharyngeal space effusion, with fluid tracking laterally to involve both carotid spaces, more so the left. From the left carotid space fluid also tracks anteriorly toward the left submandibular space (series 5 image 38). The fusion is maximal at the level of the hypopharynx corresponding to the C3 vertebral level, measuring nearly 10 mm AP. No rim enhancement. The nasopharynx appears normal. There is no definite oropharyngeal tonsillar enlargement or soft tissue thickening. The hypopharynx is effaced, but without definite pharyngeal soft tissue thickening. The epiglottis appears normal. The glottis is closed. Salivary glands: Fluid in the posterior aspect of the left submandibular space as described above, but both submandibular glands appear within normal limits. Negative sublingual space. Negative parotid spaces. Thyroid: Enlarged left lobe of the  thyroid containing a 04/16/2018 5 mm diameter hypodense nodule which appears contiguous with the posterior capsule of the left lobe. There is fluid surrounding the left lobe of the thyroid (series 5, image 50) contiguous with the effusion described above, and there is heterogeneous hyperdensity along the  posterior superior margin of the left thyroid lobe (series 5, image 49). The isthmus and right lobe have a more normal appearance. There is a small volume of fluid along the superior aspect of the right thyroid lobe. There is a mild degree of generalized thyromegaly. Lymph nodes: No cervical lymphadenopathy. No hyper enhancing or cystic lymph nodes are identified. Vascular: Major vascular structures in the neck and at the skullbase, including the left IJ remain patent. The left IJ is mildly effaced at the level of the thyroid. The right IJ appears dominant. The left vertebral artery appears dominant. Limited intracranial: Negative. Visualized orbits: Negative. Mastoids and visualized paranasal sinuses: Minimal maxillary sinus alveolar recess mucosal thickening, otherwise clear. Skeleton: No longus coli muscle calcifications. The longus coli muscles do not appear enlarged. Normal CT appearance of the cervical spine. Mild degenerative changes at the right TMJ. No acute osseous abnormality identified. Upper chest: Visualized superior mediastinum appears within normal limits; there is a small volume of residual thymus. Negative lung apices. Incidental occasional small new mat assessed in the left lung (Series 6, image 37). Bilateral axillary lymph nodes are somewhat numerous but within normal limits for age. IMPRESSION: 1. Relatively large trans spatial fluid collection in the deep neck, centered at the retropharyngeal space, but also involving the left greater than right carotid spaces, space around the left thyroid, and posterior left submandibular space. 2. No convincing soft tissue thickening or tonsillar  abnormality in the pharynx. No CT changes of cervical discitis osteomyelitis. No changes of chronic longus coli tendonitis. No cervical lymphadenopathy. 3. There is a 2-2.5 cm round low-density cystic lesion expanding the left lobe of the thyroid, and appearing somewhat contiguous with the abnormal fluid in #1. 4. Top differential considerations include acute infection, spontaneous hemorrhage, angioedema/3rd spacing of fluid. 5. Recommend cervical spine MRI without and with contrast to tried a narrow the differential diagnosis. This was discussed by telephone with ED Provider LEISA TAPIA PA-C on 04/20/2015 at 16:52 . Electronically Signed   By: Genevie Ann M.D.   On: 04/20/2015 17:00   Mr Cervical Spine W Wo Contrast  04/20/2015  CLINICAL DATA:  30 year old female with several days of acute neck pain and dysphagia. Abnormal neck CT with IV contrast, raising the possibility of retropharyngeal or cervical spine infection 1618 hours today. EXAM: MRI CERVICAL SPINE WITHOUT AND WITH CONTRAST TECHNIQUE: Multiplanar and multiecho pulse sequences of the cervical spine, to include the craniocervical junction and cervicothoracic junction, were obtained according to standard protocol without and with intravenous contrast. CONTRAST:  21m MULTIHANCE GADOBENATE DIMEGLUMINE 529 MG/ML IV SOLN COMPARISON:  Neck CT today reported separately. FINDINGS: Mild reversal of cervical lordosis. No marrow edema or abnormal bone marrow signal in the cervical spine. No abnormal signal in the longus coli muscles. No cervical spine abnormal dural enhancement or epidural inflammation. Normal for age cervical and upper thoracic intervertebral disc signal. Cervicomedullary junction is within normal limits. Negative visualized posterior fossa structures. Spinal cord signal is within normal limits at all visualized levels. No cervical spinal stenosis. No abnormal intradural enhancement. The extensive trans spatial low-density which was thought related  to fluid on the earlier neck CT demonstrates diffuse hyperenhancement, indicating this is trans spatial inflammation and/or soft tissue inflammation (cellulitis like). This same distribution of changes is observed as on the earlier CT, with retropharyngeal space predominance, but also significant involvement around the left thyroid lobe (series 3, image 18) where a 22 mm diameter round thyroid nodule is re- demonstrated. The dependent aspect  of this nodule demonstrates loss of gradient echo imaging (series 7, image 31), and almost all of the nodule demonstrates intrinsic T1 hyperintensity. Elsewhere there is trace loss of gradient signal amidst the inflammation situated between the retropharynx and left carotid space (series 7, image 20). The larynx and pharynx soft tissue contours remain normal, with no pharyngeal soft tissue thickening or hyper enhancement. The sublingual space remains unaffected. However, the inflammatory changes are tracking into the left superior mediastinum (series 3, image 15). Negative lung apices. IMPRESSION: 1. This study is negative for: Cervical spine infection, abnormality of the longus coli muscles, or changes of acute pharyngeal infection. 2. However, the study is positive for extensive retropharyngeal inflammation - an acute retropharyngeal cellulitis appearance - that also tracks inferiorly surrounding the left thyroid lobe, and continues into the superior mediastinum. Despite the low density on the earlier neck CT, there is no fluid or fluid collection in the neck. 3. This is an unusual finding, and the etiology remains unclear. The patient has had symptoms for several days, has acute leukocytosis on presentation, but by report is afebrile and nontoxic appearing. I discussed the findings and possible etiologies including both infectious and noninfectious inflammation by telephone with Jeannett Senior, PA-C in the ED. Electronically Signed   By: Genevie Ann M.D.   On: 04/20/2015  21:40    MMH:[id number] except as listed in admit H&P  Blood pressure 106/74, pulse 64, temperature 97.8 F (36.6 C), temperature source Oral, resp. rate 17, height _0  (1.626 m), weight 59 kg (130 lb 1.1 oz), last menstrual period 04/01/2015, SpO2 100 %, not currently breastfeeding.  PHYSICAL EXAM: Overall appearance:  Healthy appearing, in no distress, voice is normal, no trouble breathing, she seems very comfortable Head:  Normocephalic, atraumatic. Ears: External ears look normal. Nose: External nose is healthy in appearance. Internal nasal exam free of any lesions or obstruction. Oral Cavity/Pharynx:  There are no mucosal lesions or masses identified. There is no trismus. Larynx/Hypopharynx: Deferred Neuro:  No identifiable neurologic deficits. Neck: There is a nontender soft mass in the left thyroid and some residual left side jugular adenopathy without any fluctuance.  Studies Reviewed: CT and MRI.  Procedures: none   Assessment/Plan: Thyroid mass, acute inflammatory process involving the pharynx and the neck, mostly resolved with intravenous antibiotics. CT and MRI reviewed. No drainable abscess identified. Continue antibiotics, can probably switch to oral antibiotic and I will follow up in a couple of weeks in the office to make sure there are no persistent pathologic findings especially related to the thyroid.  Caidin Heidenreich 04/21/2015, 7:47 AM

## 2015-04-21 NOTE — Progress Notes (Signed)
PATIENT DETAILS Name: Connie Wells Age: 30 y.o. Sex: female Date of Birth: [birth year] Admit Date: 04/20/2015 Admitting Physician Rise Patience, MD PCP:No PCP Per Patient  Subjective: Seems to have a rapidly improved-neck pain no minimal. No difficulty swallowing. No stridor.  Assessment/Plan: Principal Problem: Pharyngitis with retropharyngeal inflammation: Suspect infectious etiology-await cultures. Seems to have rapidly improved following IV antibiotics. Continue empiric vancomycin and Unasyn. ENT consult appreciated, depending on further clinical response-suspect we can start oral antibiotics and discharge with outpatient ENT follow-up.  Active Problems: 2.5 cm cystic lesion in the left thyroid lobe: Not sure if this is related to above. ENT follow-up as outpatient.  SOLITARY KIDNEY  Disposition: Remain inpatient-transfer to med surg  Antimicrobial agents  See below  Anti-infectives    Start     Dose/Rate Route Frequency Ordered Stop   04/21/15 0030  vancomycin (VANCOCIN) IVPB 750 mg/150 ml premix     750 mg 150 mL/hr over 60 Minutes Intravenous Every 12 hours 04/21/15 0023     04/21/15 0030  ampicillin-sulbactam (UNASYN) 1.5 g in sodium chloride 0.9 % 50 mL IVPB     1.5 g 100 mL/hr over 30 Minutes Intravenous 4 times per day 04/21/15 0023     04/20/15 1730  clindamycin (CLEOCIN) IVPB 600 mg     600 mg 100 mL/hr over 30 Minutes Intravenous  Once 04/20/15 1723 04/20/15 1855      DVT Prophylaxis: Prophylactic Lovenox   Code Status: Full code   Family Communication None at bedside  Procedures: None  CONSULTS:  ENT  Time spent 30 minutes-Greater than 50% of this time was spent in counseling, explanation of diagnosis, planning of further management, and coordination of care.  MEDICATIONS: Scheduled Meds: . ampicillin-sulbactam (UNASYN) IV  1.5 g Intravenous 4 times per day  . vancomycin  750 mg Intravenous Q12H    Continuous Infusions:  PRN Meds:.acetaminophen **OR** acetaminophen, ondansetron **OR** ondansetron (ZOFRAN) IV    PHYSICAL EXAM: Vital signs in last 24 hours: Filed Vitals:   04/21/15 1000 04/21/15 1200 04/21/15 1307 04/21/15 1400  BP: 116/79 103/73    Pulse: 69 70  68  Temp:   98.6 F (37 C)   TempSrc:   Oral   Resp: 24 22  20   Height:      Weight:      SpO2: 100% 100%  100%    Weight change:  Filed Weights   04/20/15 1410 04/21/15 0000  Weight: 58.996 kg (130 lb 1 oz) 59 kg (130 lb 1.1 oz)   Body mass index is 22.32 kg/(m^2).   Gen Exam: Awake and alert with clear speech.   Neck: Supple, No JVD.   Chest: B/L Clear.   CVS: S1 S2 Regular, no murmurs.  Abdomen: soft, BS +, non tender, non distended.  Extremities: no edema, lower extremities warm to touch. Neurologic: Non Focal.   Skin: No Rash.   Wounds: N/A.    Intake/Output from previous day:  Intake/Output Summary (Last 24 hours) at 04/21/15 1453 Last data filed at 04/21/15 0800  Gross per 24 hour  Intake    250 ml  Output      0 ml  Net    250 ml     LAB RESULTS: CBC  Recent Labs Lab 04/20/15 1454 04/20/15 1503  WBC 19.5*  --   HGB 12.8 15.3*  HCT 38.7 45.0  PLT 272  --  MCV 95.3  --   MCH 31.5  --   MCHC 33.1  --   RDW 13.2  --   LYMPHSABS 1.9  --   MONOABS 1.5*  --   EOSABS 0.0  --   BASOSABS 0.0  --     Chemistries   Recent Labs Lab 04/20/15 1503 04/21/15 0422  NA 140 137  K 4.3 4.4  CL 100* 106  CO2  --  22  GLUCOSE 106* 149*  BUN 8 9  CREATININE 0.80 0.91  CALCIUM  --  8.7*    CBG: No results for input(s): GLUCAP in the last 168 hours.  GFR Estimated Creatinine Clearance: 79.5 mL/min (by C-G formula based on Cr of 0.91).  Coagulation profile No results for input(s): INR, PROTIME in the last 168 hours.  Cardiac Enzymes No results for input(s): CKMB, TROPONINI, MYOGLOBIN in the last 168 hours.  Invalid input(s): CK  Invalid input(s): POCBNP No results for  input(s): DDIMER in the last 72 hours. No results for input(s): HGBA1C in the last 72 hours. No results for input(s): CHOL, HDL, LDLCALC, TRIG, CHOLHDL, LDLDIRECT in the last 72 hours. No results for input(s): TSH, T4TOTAL, T3FREE, THYROIDAB in the last 72 hours.  Invalid input(s): FREET3 No results for input(s): VITAMINB12, FOLATE, FERRITIN, TIBC, IRON, RETICCTPCT in the last 72 hours. No results for input(s): LIPASE, AMYLASE in the last 72 hours.  Urine Studies No results for input(s): UHGB, CRYS in the last 72 hours.  Invalid input(s): UACOL, UAPR, USPG, UPH, UTP, UGL, UKET, UBIL, UNIT, UROB, ULEU, UEPI, UWBC, URBC, UBAC, CAST, UCOM, BILUA  MICROBIOLOGY: Recent Results (from the past 240 hour(s))  Rapid strep screen     Status: None   Collection Time: 04/20/15  2:50 PM  Result Value Ref Range Status   Streptococcus, Group A Screen (Direct) NEGATIVE NEGATIVE Final    Comment: (NOTE) A Rapid Antigen test may result negative if the antigen level in the sample is below the detection level of this test. The FDA has not cleared this test as a stand-alone test therefore the rapid antigen negative result has reflexed to a Group A Strep culture.   Culture, group A strep     Status: None (Preliminary result)   Collection Time: 04/20/15  2:50 PM  Result Value Ref Range Status   Specimen Description THROAT  Final   Special Requests NONE Reflexed from [id number]  Final   Culture CULTURE REINCUBATED FOR BETTER GROWTH  Final   Report Status PENDING  Incomplete  MRSA PCR Screening     Status: None   Collection Time: 04/21/15  7:17 AM  Result Value Ref Range Status   MRSA by PCR NEGATIVE NEGATIVE Final    Comment:        The GeneXpert MRSA Assay (FDA approved for NASAL specimens only), is one component of a comprehensive MRSA colonization surveillance program. It is not intended to diagnose MRSA infection nor to guide or monitor treatment for MRSA infections.     RADIOLOGY  STUDIES/RESULTS: Dg Chest 2 View  04/20/2015  CLINICAL DATA:  30 year old with sore throat for 2 days. EXAM: CHEST  2 VIEW COMPARISON:  Radiographs 05/12/2006 FINDINGS: The heart size and mediastinal contours are normal. The lungs are clear. There is no pleural effusion or pneumothorax. No acute osseous findings are identified. IMPRESSION: Stable chest.  No active cardiopulmonary process. Electronically Signed   By: Richardean Sale M.D.   On: 04/20/2015 15:36   Ct Soft Tissue Neck  W Contrast  04/20/2015  CLINICAL DATA:  30 year old female with neck pain swelling, dysphagia and limited range of motion for 2 days. Initial encounter. EXAM: CT NECK WITH CONTRAST TECHNIQUE: Multidetector CT imaging of the neck was performed using the standard protocol following the bolus administration of intravenous contrast. CONTRAST:  178mL OMNIPAQUE IOHEXOL 300 MG/ML  SOLN COMPARISON:  None. FINDINGS: Pharynx and larynx: Large retropharyngeal space effusion, with fluid tracking laterally to involve both carotid spaces, more so the left. From the left carotid space fluid also tracks anteriorly toward the left submandibular space (series 5 image 38). The fusion is maximal at the level of the hypopharynx corresponding to the C3 vertebral level, measuring nearly 10 mm AP. No rim enhancement. The nasopharynx appears normal. There is no definite oropharyngeal tonsillar enlargement or soft tissue thickening. The hypopharynx is effaced, but without definite pharyngeal soft tissue thickening. The epiglottis appears normal. The glottis is closed. Salivary glands: Fluid in the posterior aspect of the left submandibular space as described above, but both submandibular glands appear within normal limits. Negative sublingual space. Negative parotid spaces. Thyroid: Enlarged left lobe of the thyroid containing a 04/16/2018 5 mm diameter hypodense nodule which appears contiguous with the posterior capsule of the left lobe. There is fluid  surrounding the left lobe of the thyroid (series 5, image 50) contiguous with the effusion described above, and there is heterogeneous hyperdensity along the posterior superior margin of the left thyroid lobe (series 5, image 49). The isthmus and right lobe have a more normal appearance. There is a small volume of fluid along the superior aspect of the right thyroid lobe. There is a mild degree of generalized thyromegaly. Lymph nodes: No cervical lymphadenopathy. No hyper enhancing or cystic lymph nodes are identified. Vascular: Major vascular structures in the neck and at the skullbase, including the left IJ remain patent. The left IJ is mildly effaced at the level of the thyroid. The right IJ appears dominant. The left vertebral artery appears dominant. Limited intracranial: Negative. Visualized orbits: Negative. Mastoids and visualized paranasal sinuses: Minimal maxillary sinus alveolar recess mucosal thickening, otherwise clear. Skeleton: No longus coli muscle calcifications. The longus coli muscles do not appear enlarged. Normal CT appearance of the cervical spine. Mild degenerative changes at the right TMJ. No acute osseous abnormality identified. Upper chest: Visualized superior mediastinum appears within normal limits; there is a small volume of residual thymus. Negative lung apices. Incidental occasional small new mat assessed in the left lung (Series 6, image 37). Bilateral axillary lymph nodes are somewhat numerous but within normal limits for age. IMPRESSION: 1. Relatively large trans spatial fluid collection in the deep neck, centered at the retropharyngeal space, but also involving the left greater than right carotid spaces, space around the left thyroid, and posterior left submandibular space. 2. No convincing soft tissue thickening or tonsillar abnormality in the pharynx. No CT changes of cervical discitis osteomyelitis. No changes of chronic longus coli tendonitis. No cervical lymphadenopathy. 3.  There is a 2-2.5 cm round low-density cystic lesion expanding the left lobe of the thyroid, and appearing somewhat contiguous with the abnormal fluid in #1. 4. Top differential considerations include acute infection, spontaneous hemorrhage, angioedema/3rd spacing of fluid. 5. Recommend cervical spine MRI without and with contrast to tried a narrow the differential diagnosis. This was discussed by telephone with ED Provider LEISA TAPIA PA-C on 04/20/2015 at 16:52 . Electronically Signed   By: Genevie Ann M.D.   On: 04/20/2015 17:00   Mr Cervical Spine  W Wo Contrast  04/20/2015  CLINICAL DATA:  30 year old female with several days of acute neck pain and dysphagia. Abnormal neck CT with IV contrast, raising the possibility of retropharyngeal or cervical spine infection 1618 hours today. EXAM: MRI CERVICAL SPINE WITHOUT AND WITH CONTRAST TECHNIQUE: Multiplanar and multiecho pulse sequences of the cervical spine, to include the craniocervical junction and cervicothoracic junction, were obtained according to standard protocol without and with intravenous contrast. CONTRAST:  60mL MULTIHANCE GADOBENATE DIMEGLUMINE 529 MG/ML IV SOLN COMPARISON:  Neck CT today reported separately. FINDINGS: Mild reversal of cervical lordosis. No marrow edema or abnormal bone marrow signal in the cervical spine. No abnormal signal in the longus coli muscles. No cervical spine abnormal dural enhancement or epidural inflammation. Normal for age cervical and upper thoracic intervertebral disc signal. Cervicomedullary junction is within normal limits. Negative visualized posterior fossa structures. Spinal cord signal is within normal limits at all visualized levels. No cervical spinal stenosis. No abnormal intradural enhancement. The extensive trans spatial low-density which was thought related to fluid on the earlier neck CT demonstrates diffuse hyperenhancement, indicating this is trans spatial inflammation and/or soft tissue inflammation  (cellulitis like). This same distribution of changes is observed as on the earlier CT, with retropharyngeal space predominance, but also significant involvement around the left thyroid lobe (series 3, image 18) where a 22 mm diameter round thyroid nodule is re- demonstrated. The dependent aspect of this nodule demonstrates loss of gradient echo imaging (series 7, image 31), and almost all of the nodule demonstrates intrinsic T1 hyperintensity. Elsewhere there is trace loss of gradient signal amidst the inflammation situated between the retropharynx and left carotid space (series 7, image 20). The larynx and pharynx soft tissue contours remain normal, with no pharyngeal soft tissue thickening or hyper enhancement. The sublingual space remains unaffected. However, the inflammatory changes are tracking into the left superior mediastinum (series 3, image 15). Negative lung apices. IMPRESSION: 1. This study is negative for: Cervical spine infection, abnormality of the longus coli muscles, or changes of acute pharyngeal infection. 2. However, the study is positive for extensive retropharyngeal inflammation - an acute retropharyngeal cellulitis appearance - that also tracks inferiorly surrounding the left thyroid lobe, and continues into the superior mediastinum. Despite the low density on the earlier neck CT, there is no fluid or fluid collection in the neck. 3. This is an unusual finding, and the etiology remains unclear. The patient has had symptoms for several days, has acute leukocytosis on presentation, but by report is afebrile and nontoxic appearing. I discussed the findings and possible etiologies including both infectious and noninfectious inflammation by telephone with Jeannett Senior, PA-C in the ED. Electronically Signed   By: Genevie Ann M.D.   On: 04/20/2015 21:40    Oren Binet, MD  [facility] Pager:[phone]  If 7PM-7AM, please contact night-coverage [email or web address] Password  [email or web address] 04/21/2015, 2:53 PM   LOS: 0 days

## 2015-04-21 NOTE — Progress Notes (Signed)
Pharmacy Antibiotic Note  Connie Wells is a 30 y.o. female admitted on 04/20/2015 with retropharyngeal cellulitis.  Pharmacy has been consulted for Vancocin and Unasyn dosing.  Plan: Will begin vancomycin 750mg  IV Q12H and Unasyn 1.5g IV Q6H and monitor CBC, Cx, levels prn.  Weight: 130 lb 1 oz (58.996 kg)  Temp (24hrs), Avg:98.6 F (37 C), Min:98.2 F (36.8 C), Max:99.3 F (37.4 C)   Recent Labs Lab 04/20/15 1454 04/20/15 1503 04/20/15 1821  WBC 19.5*  --   --   CREATININE  --  0.80  --   LATICACIDVEN  --   --  0.7     No Known Allergies  Antimicrobials this admission: Vancomycin 2/24 >>  Unasyn 2/24 >>    Microbiology results: 2/23 BCx x2   Thank you for allowing pharmacy to be a part of this patient's care.  Wynona Neat, PharmD, BCPS  04/21/2015 12:17 AM

## 2015-04-21 NOTE — Progress Notes (Signed)
Utilization review completed. Peachie Barkalow, RN, BSN. 

## 2015-04-21 NOTE — H&P (Signed)
[facility] History and Physical  Connie Wells [id number] DOB: 1985-12-24 DOA: 04/20/2015  Referring physician: Ms.Kirichenko. PCP: No PCP Per Patient  Specialists: None.  Chief Complaint: Neck pain.  HPI: Connie Wells is a 30 y.o. female with no significant past medical history has been experiencing some neck pain with fever and chills over the last 2 days. Patient also has been having some difficulty swallowing. Patient had CT scan of the neck followed by MRI of the C-spine which shows inflammation involving the retropharyngeal area extending up to the mediastinum. On exam patient also has tenderness on the neck area. Blood cultures were obtained and on-call ENT surgeon Dr. Constance Holster was consulted by ER physician and patient has been admitted for IV antibiotics. Patient denies any difficulty breathing at this time.   Review of Systems: As presented in the history of presenting illness, rest negative.  Past Medical History  Diagnosis Date  . Solitary kidney   . Herpes genitalia    Past Surgical History  Procedure Laterality Date  . Ectopic pregnancy surgery    . Dilation and evacuation     Social History:  reports that she has been smoking Cigarettes.  She has a 2.5 pack-year smoking history. She has never used smokeless tobacco. She reports that she uses illicit drugs (Marijuana). She reports that she does not drink alcohol. Where does patient live home. Can patient participate in ADLs? Yes.  No Known Allergies  Family History:  Family History  Problem Relation Age of Onset  . Diabetes Mother   . Hypertension Mother   . Diabetes Maternal Aunt   . Asthma Brother       Prior to Admission medications   Medication Sig Start Date End Date Taking? Authorizing Provider  ibuprofen (ADVIL,MOTRIN) 600 MG tablet Take 1 tablet (600 mg total) by mouth every 6 (six) hours. Patient not taking: Reported on 12/21/2014 11/16/13   Josephine Cables, MD  Prenatal  Vit-Fe Fumarate-FA (PRENATAL MULTIVITAMIN) TABS tablet Take 1 tablet by mouth daily at 12 noon. Patient not taking: Reported on 12/21/2014 11/11/13   Lavonia Drafts, MD  promethazine (PHENERGAN) 12.5 MG tablet Take 1 tablet (12.5 mg total) by mouth every 6 (six) hours as needed for nausea or vomiting. Patient not taking: Reported on 04/20/2015 12/21/14   Ashley Murrain, NP    Physical Exam: Filed Vitals:   04/20/15 1410 04/20/15 1634 04/20/15 1946 04/20/15 2253  BP: 108/73 103/64 96/60 94/62   Pulse: 112 93 86 75  Temp: 99.3 F (37.4 C) 98.5 F (36.9 C) 98.5 F (36.9 C) 98.2 F (36.8 C)  TempSrc: Oral Oral Oral Oral  Resp: 18 18 18 16   Weight: 130 lb 1 oz (58.996 kg)     SpO2: 100% 100% 100% 100%     General:  Moderately built and nourished.  Eyes: Anicteric no pallor.  ENT: No discharge from the ears eyes nose and mouth.  Neck: Tenderness around the anterior aspect of the neck. Mild swelling.  Cardiovascular: S1-S2 heard.  Respiratory: No rhonchi or crepitations.  Abdomen: Soft nontender bowel sounds present.  Skin: No rash.  Musculoskeletal: No edema.  Psychiatric: Appears normal.  Neurologic: Alert awake oriented to time place and person. Moves all extremities.  Labs on Admission:  Basic Metabolic Panel:  Recent Labs Lab 04/20/15 1503  NA 140  K 4.3  CL 100*  GLUCOSE 106*  BUN 8  CREATININE 0.80   Liver Function Tests: No results for input(s): AST, ALT, ALKPHOS, BILITOT, PROT,  ALBUMIN in the last 168 hours. No results for input(s): LIPASE, AMYLASE in the last 168 hours. No results for input(s): AMMONIA in the last 168 hours. CBC:  Recent Labs Lab 04/20/15 1454 04/20/15 1503  WBC 19.5*  --   NEUTROABS 16.1*  --   HGB 12.8 15.3*  HCT 38.7 45.0  MCV 95.3  --   PLT 272  --    Cardiac Enzymes: No results for input(s): CKTOTAL, CKMB, CKMBINDEX, TROPONINI in the last 168 hours.  BNP (last 3 results) No results for input(s): BNP in the last  8760 hours.  ProBNP (last 3 results) No results for input(s): PROBNP in the last 8760 hours.  CBG: No results for input(s): GLUCAP in the last 168 hours.  Radiological Exams on Admission: Dg Chest 2 View  04/20/2015  CLINICAL DATA:  30 year old with sore throat for 2 days. EXAM: CHEST  2 VIEW COMPARISON:  Radiographs 05/12/2006 FINDINGS: The heart size and mediastinal contours are normal. The lungs are clear. There is no pleural effusion or pneumothorax. No acute osseous findings are identified. IMPRESSION: Stable chest.  No active cardiopulmonary process. Electronically Signed   By: Richardean Sale M.D.   On: 04/20/2015 15:36   Ct Soft Tissue Neck W Contrast  04/20/2015  CLINICAL DATA:  30 year old female with neck pain swelling, dysphagia and limited range of motion for 2 days. Initial encounter. EXAM: CT NECK WITH CONTRAST TECHNIQUE: Multidetector CT imaging of the neck was performed using the standard protocol following the bolus administration of intravenous contrast. CONTRAST:  172mL OMNIPAQUE IOHEXOL 300 MG/ML  SOLN COMPARISON:  None. FINDINGS: Pharynx and larynx: Large retropharyngeal space effusion, with fluid tracking laterally to involve both carotid spaces, more so the left. From the left carotid space fluid also tracks anteriorly toward the left submandibular space (series 5 image 38). The fusion is maximal at the level of the hypopharynx corresponding to the C3 vertebral level, measuring nearly 10 mm AP. No rim enhancement. The nasopharynx appears normal. There is no definite oropharyngeal tonsillar enlargement or soft tissue thickening. The hypopharynx is effaced, but without definite pharyngeal soft tissue thickening. The epiglottis appears normal. The glottis is closed. Salivary glands: Fluid in the posterior aspect of the left submandibular space as described above, but both submandibular glands appear within normal limits. Negative sublingual space. Negative parotid spaces. Thyroid:  Enlarged left lobe of the thyroid containing a 04/16/2018 5 mm diameter hypodense nodule which appears contiguous with the posterior capsule of the left lobe. There is fluid surrounding the left lobe of the thyroid (series 5, image 50) contiguous with the effusion described above, and there is heterogeneous hyperdensity along the posterior superior margin of the left thyroid lobe (series 5, image 49). The isthmus and right lobe have a more normal appearance. There is a small volume of fluid along the superior aspect of the right thyroid lobe. There is a mild degree of generalized thyromegaly. Lymph nodes: No cervical lymphadenopathy. No hyper enhancing or cystic lymph nodes are identified. Vascular: Major vascular structures in the neck and at the skullbase, including the left IJ remain patent. The left IJ is mildly effaced at the level of the thyroid. The right IJ appears dominant. The left vertebral artery appears dominant. Limited intracranial: Negative. Visualized orbits: Negative. Mastoids and visualized paranasal sinuses: Minimal maxillary sinus alveolar recess mucosal thickening, otherwise clear. Skeleton: No longus coli muscle calcifications. The longus coli muscles do not appear enlarged. Normal CT appearance of the cervical spine. Mild degenerative changes at  the right TMJ. No acute osseous abnormality identified. Upper chest: Visualized superior mediastinum appears within normal limits; there is a small volume of residual thymus. Negative lung apices. Incidental occasional small new mat assessed in the left lung (Series 6, image 37). Bilateral axillary lymph nodes are somewhat numerous but within normal limits for age. IMPRESSION: 1. Relatively large trans spatial fluid collection in the deep neck, centered at the retropharyngeal space, but also involving the left greater than right carotid spaces, space around the left thyroid, and posterior left submandibular space. 2. No convincing soft tissue  thickening or tonsillar abnormality in the pharynx. No CT changes of cervical discitis osteomyelitis. No changes of chronic longus coli tendonitis. No cervical lymphadenopathy. 3. There is a 2-2.5 cm round low-density cystic lesion expanding the left lobe of the thyroid, and appearing somewhat contiguous with the abnormal fluid in #1. 4. Top differential considerations include acute infection, spontaneous hemorrhage, angioedema/3rd spacing of fluid. 5. Recommend cervical spine MRI without and with contrast to tried a narrow the differential diagnosis. This was discussed by telephone with ED Provider LEISA TAPIA PA-C on 04/20/2015 at 16:52 . Electronically Signed   By: Genevie Ann M.D.   On: 04/20/2015 17:00   Mr Cervical Spine W Wo Contrast  04/20/2015  CLINICAL DATA:  30 year old female with several days of acute neck pain and dysphagia. Abnormal neck CT with IV contrast, raising the possibility of retropharyngeal or cervical spine infection 1618 hours today. EXAM: MRI CERVICAL SPINE WITHOUT AND WITH CONTRAST TECHNIQUE: Multiplanar and multiecho pulse sequences of the cervical spine, to include the craniocervical junction and cervicothoracic junction, were obtained according to standard protocol without and with intravenous contrast. CONTRAST:  67mL MULTIHANCE GADOBENATE DIMEGLUMINE 529 MG/ML IV SOLN COMPARISON:  Neck CT today reported separately. FINDINGS: Mild reversal of cervical lordosis. No marrow edema or abnormal bone marrow signal in the cervical spine. No abnormal signal in the longus coli muscles. No cervical spine abnormal dural enhancement or epidural inflammation. Normal for age cervical and upper thoracic intervertebral disc signal. Cervicomedullary junction is within normal limits. Negative visualized posterior fossa structures. Spinal cord signal is within normal limits at all visualized levels. No cervical spinal stenosis. No abnormal intradural enhancement. The extensive trans spatial low-density  which was thought related to fluid on the earlier neck CT demonstrates diffuse hyperenhancement, indicating this is trans spatial inflammation and/or soft tissue inflammation (cellulitis like). This same distribution of changes is observed as on the earlier CT, with retropharyngeal space predominance, but also significant involvement around the left thyroid lobe (series 3, image 18) where a 22 mm diameter round thyroid nodule is re- demonstrated. The dependent aspect of this nodule demonstrates loss of gradient echo imaging (series 7, image 31), and almost all of the nodule demonstrates intrinsic T1 hyperintensity. Elsewhere there is trace loss of gradient signal amidst the inflammation situated between the retropharynx and left carotid space (series 7, image 20). The larynx and pharynx soft tissue contours remain normal, with no pharyngeal soft tissue thickening or hyper enhancement. The sublingual space remains unaffected. However, the inflammatory changes are tracking into the left superior mediastinum (series 3, image 15). Negative lung apices. IMPRESSION: 1. This study is negative for: Cervical spine infection, abnormality of the longus coli muscles, or changes of acute pharyngeal infection. 2. However, the study is positive for extensive retropharyngeal inflammation - an acute retropharyngeal cellulitis appearance - that also tracks inferiorly surrounding the left thyroid lobe, and continues into the superior mediastinum. Despite the low density  on the earlier neck CT, there is no fluid or fluid collection in the neck. 3. This is an unusual finding, and the etiology remains unclear. The patient has had symptoms for several days, has acute leukocytosis on presentation, but by report is afebrile and nontoxic appearing. I discussed the findings and possible etiologies including both infectious and noninfectious inflammation by telephone with Jeannett Senior, PA-C in the ED. Electronically Signed   By: Genevie Ann  M.D.   On: 04/20/2015 21:40     Assessment/Plan Principal Problem:   Pharyngitis Active Problems:   SOLITARY KIDNEY   1. Retropharyngeal inflammation concerning for infection extending to the mediastinum - at this time patient will be kept on clear liquid diet and I have placed patient on vancomycin and Unasyn. Follow blood cultures. ENT surgeon Dr. Constance Holster will be seeing patient in consult. Overnight we will observe and stepdown to make sure patient is not having any worsening of the breathing. 2. Tobacco abuse - tobacco cessation counseling requested.   DVT Prophylaxis SCDs.  Code Status: Full code.  Family Communication: Discussed with patient.  Disposition Plan: Admit to inpatient.    Yama Nielson N. [facility] Pager [phone].  If 7PM-7AM, please contact night-coverage [email or web address] Password [facility] 04/21/2015, 12:06 AM

## 2015-04-21 NOTE — ED Notes (Signed)
Attempted report x1. 

## 2015-04-21 NOTE — ED Notes (Signed)
Breakfast Tray ordered @ [id number].

## 2015-04-22 LAB — CBC
HEMATOCRIT: 33.6 % — AB (ref 36.0–46.0)
HEMOGLOBIN: 11.4 g/dL — AB (ref 12.0–15.0)
MCH: 32.2 pg (ref 26.0–34.0)
MCHC: 33.9 g/dL (ref 30.0–36.0)
MCV: 94.9 fL (ref 78.0–100.0)
PLATELETS: 328 10*3/uL (ref 150–400)
RBC: 3.54 MIL/uL — AB (ref 3.87–5.11)
RDW: 13.1 % (ref 11.5–15.5)
WBC: 17.9 10*3/uL — AB (ref 4.0–10.5)

## 2015-04-22 NOTE — Progress Notes (Signed)
PATIENT DETAILS Name: Connie Wells Age: 30 y.o. Sex: female Date of Birth: 1985/07/12 Admit Date: 04/20/2015 Admitting Physician Rise Patience, MD PCP:No PCP Per Patient  Subjective: Continues to improve-minimal pain in the left lower neck. Eating well without any difficulty. Assessment/Plan: Principal Problem: Pharyngitis with retropharyngeal inflammation: Suspect infectious etiology-await cultures. Continues to improve rapidly, leukocytosis decreasing. Await blood/throat cultures. Continue empiric vancomycin and Unasyn. ENT consult appreciated, depending on further clinical response-suspect we can start oral antibiotics in the next 1-2 days and discharge with outpatient ENT follow-up.  Active Problems: 2.5 cm cystic lesion in the left thyroid lobe: Not sure if this is related to above. ENT follow-up as outpatient.  SOLITARY KIDNEY  Disposition: Remain inpatient- home tomorrow  Antimicrobial agents  See below  Anti-infectives    Start     Dose/Rate Route Frequency Ordered Stop   04/21/15 0030  vancomycin (VANCOCIN) IVPB 750 mg/150 ml premix     750 mg 150 mL/hr over 60 Minutes Intravenous Every 12 hours 04/21/15 0023     04/21/15 0030  ampicillin-sulbactam (UNASYN) 1.5 g in sodium chloride 0.9 % 50 mL IVPB     1.5 g 100 mL/hr over 30 Minutes Intravenous 4 times per day 04/21/15 0023     04/20/15 1730  clindamycin (CLEOCIN) IVPB 600 mg     600 mg 100 mL/hr over 30 Minutes Intravenous  Once 04/20/15 1723 04/20/15 1855      DVT Prophylaxis: Prophylactic Lovenox   Code Status: Full code   Family Communication None at bedside  Procedures: None  CONSULTS:  ENT  Time spent 30 minutes-Greater than 50% of this time was spent in counseling, explanation of diagnosis, planning of further management, and coordination of care.  MEDICATIONS: Scheduled Meds: . ampicillin-sulbactam (UNASYN) IV  1.5 g Intravenous 4 times per day  .  enoxaparin (LOVENOX) injection  40 mg Subcutaneous Q24H  . vancomycin  750 mg Intravenous Q12H   Continuous Infusions:  PRN Meds:.acetaminophen **OR** acetaminophen, ondansetron **OR** ondansetron (ZOFRAN) IV    PHYSICAL EXAM: Vital signs in last 24 hours: Filed Vitals:   04/21/15 2000 04/21/15 2059 04/21/15 2126 04/22/15 0504  BP: 105/63  114/79 123/85  Pulse: 72  87 81  Temp:  98.8 F (37.1 C) 98.9 F (37.2 C) 98.9 F (37.2 C)  TempSrc:  Oral Oral Oral  Resp: 24   20  Height:      Weight:      SpO2: 100%  98% 100%    Weight change:  Filed Weights   04/20/15 1410 04/21/15 0000  Weight: 58.996 kg (130 lb 1 oz) 59 kg (130 lb 1.1 oz)   Body mass index is 22.32 kg/(m^2).   Gen Exam: Awake and alert with clear speech.   Neck: Supple, No JVD.   Chest: B/L Clear.   CVS: S1 S2 Regular, no murmurs.  Abdomen: soft, BS +, non tender, non distended.  Extremities: no edema, lower extremities warm to touch. Neurologic: Non Focal.   Skin: No Rash.   Wounds: N/A.    Intake/Output from previous day:  Intake/Output Summary (Last 24 hours) at 04/22/15 1408 Last data filed at 04/21/15 1532  Gross per 24 hour  Intake     50 ml  Output      0 ml  Net     50 ml     LAB RESULTS: CBC  Recent Labs Lab 04/20/15 1454  04/20/15 1503 04/22/15 0511  WBC 19.5*  --  17.9*  HGB 12.8 15.3* 11.4*  HCT 38.7 45.0 33.6*  PLT 272  --  328  MCV 95.3  --  94.9  MCH 31.5  --  32.2  MCHC 33.1  --  33.9  RDW 13.2  --  13.1  LYMPHSABS 1.9  --   --   MONOABS 1.5*  --   --   EOSABS 0.0  --   --   BASOSABS 0.0  --   --     Chemistries   Recent Labs Lab 04/20/15 1503 04/21/15 0422  NA 140 137  K 4.3 4.4  CL 100* 106  CO2  --  22  GLUCOSE 106* 149*  BUN 8 9  CREATININE 0.80 0.91  CALCIUM  --  8.7*    CBG: No results for input(s): GLUCAP in the last 168 hours.  GFR Estimated Creatinine Clearance: 79.5 mL/min (by C-G formula based on Cr of 0.91).  Coagulation profile No  results for input(s): INR, PROTIME in the last 168 hours.  Cardiac Enzymes No results for input(s): CKMB, TROPONINI, MYOGLOBIN in the last 168 hours.  Invalid input(s): CK  Invalid input(s): POCBNP No results for input(s): DDIMER in the last 72 hours. No results for input(s): HGBA1C in the last 72 hours. No results for input(s): CHOL, HDL, LDLCALC, TRIG, CHOLHDL, LDLDIRECT in the last 72 hours. No results for input(s): TSH, T4TOTAL, T3FREE, THYROIDAB in the last 72 hours.  Invalid input(s): FREET3 No results for input(s): VITAMINB12, FOLATE, FERRITIN, TIBC, IRON, RETICCTPCT in the last 72 hours. No results for input(s): LIPASE, AMYLASE in the last 72 hours.  Urine Studies No results for input(s): UHGB, CRYS in the last 72 hours.  Invalid input(s): UACOL, UAPR, USPG, UPH, UTP, UGL, UKET, UBIL, UNIT, UROB, ULEU, UEPI, UWBC, URBC, UBAC, CAST, UCOM, BILUA  MICROBIOLOGY: Recent Results (from the past 240 hour(s))  Rapid strep screen     Status: None   Collection Time: 04/20/15  2:50 PM  Result Value Ref Range Status   Streptococcus, Group A Screen (Direct) NEGATIVE NEGATIVE Final    Comment: (NOTE) A Rapid Antigen test may result negative if the antigen level in the sample is below the detection level of this test. The FDA has not cleared this test as a stand-alone test therefore the rapid antigen negative result has reflexed to a Group A Strep culture.   Culture, group A strep     Status: None (Preliminary result)   Collection Time: 04/20/15  2:50 PM  Result Value Ref Range Status   Specimen Description THROAT  Final   Special Requests NONE Reflexed from [id number]  Final   Culture CULTURE REINCUBATED FOR BETTER GROWTH  Final   Report Status PENDING  Incomplete  Culture, blood (Routine X 2) w Reflex to ID Panel     Status: None (Preliminary result)   Collection Time: 04/20/15  5:57 PM  Result Value Ref Range Status   Specimen Description BLOOD RIGHT ANTECUBITAL  Final   Special  Requests BOTTLES DRAWN AEROBIC AND ANAEROBIC 5CC  Final   Culture NO GROWTH 2 DAYS  Final   Report Status PENDING  Incomplete  Culture, blood (Routine X 2) w Reflex to ID Panel     Status: None (Preliminary result)   Collection Time: 04/20/15  6:04 PM  Result Value Ref Range Status   Specimen Description BLOOD RIGHT HAND  Final   Special Requests BOTTLES DRAWN AEROBIC AND ANAEROBIC  5CC  Final   Culture NO GROWTH 2 DAYS  Final   Report Status PENDING  Incomplete  MRSA PCR Screening     Status: None   Collection Time: 04/21/15  7:17 AM  Result Value Ref Range Status   MRSA by PCR NEGATIVE NEGATIVE Final    Comment:        The GeneXpert MRSA Assay (FDA approved for NASAL specimens only), is one component of a comprehensive MRSA colonization surveillance program. It is not intended to diagnose MRSA infection nor to guide or monitor treatment for MRSA infections.     RADIOLOGY STUDIES/RESULTS: Dg Chest 2 View  04/20/2015  CLINICAL DATA:  30 year old with sore throat for 2 days. EXAM: CHEST  2 VIEW COMPARISON:  Radiographs 05/12/2006 FINDINGS: The heart size and mediastinal contours are normal. The lungs are clear. There is no pleural effusion or pneumothorax. No acute osseous findings are identified. IMPRESSION: Stable chest.  No active cardiopulmonary process. Electronically Signed   By: Richardean Sale M.D.   On: 04/20/2015 15:36   Ct Soft Tissue Neck W Contrast  04/20/2015  CLINICAL DATA:  30 year old female with neck pain swelling, dysphagia and limited range of motion for 2 days. Initial encounter. EXAM: CT NECK WITH CONTRAST TECHNIQUE: Multidetector CT imaging of the neck was performed using the standard protocol following the bolus administration of intravenous contrast. CONTRAST:  151mL OMNIPAQUE IOHEXOL 300 MG/ML  SOLN COMPARISON:  None. FINDINGS: Pharynx and larynx: Large retropharyngeal space effusion, with fluid tracking laterally to involve both carotid spaces, more so the  left. From the left carotid space fluid also tracks anteriorly toward the left submandibular space (series 5 image 38). The fusion is maximal at the level of the hypopharynx corresponding to the C3 vertebral level, measuring nearly 10 mm AP. No rim enhancement. The nasopharynx appears normal. There is no definite oropharyngeal tonsillar enlargement or soft tissue thickening. The hypopharynx is effaced, but without definite pharyngeal soft tissue thickening. The epiglottis appears normal. The glottis is closed. Salivary glands: Fluid in the posterior aspect of the left submandibular space as described above, but both submandibular glands appear within normal limits. Negative sublingual space. Negative parotid spaces. Thyroid: Enlarged left lobe of the thyroid containing a 04/16/2018 5 mm diameter hypodense nodule which appears contiguous with the posterior capsule of the left lobe. There is fluid surrounding the left lobe of the thyroid (series 5, image 50) contiguous with the effusion described above, and there is heterogeneous hyperdensity along the posterior superior margin of the left thyroid lobe (series 5, image 49). The isthmus and right lobe have a more normal appearance. There is a small volume of fluid along the superior aspect of the right thyroid lobe. There is a mild degree of generalized thyromegaly. Lymph nodes: No cervical lymphadenopathy. No hyper enhancing or cystic lymph nodes are identified. Vascular: Major vascular structures in the neck and at the skullbase, including the left IJ remain patent. The left IJ is mildly effaced at the level of the thyroid. The right IJ appears dominant. The left vertebral artery appears dominant. Limited intracranial: Negative. Visualized orbits: Negative. Mastoids and visualized paranasal sinuses: Minimal maxillary sinus alveolar recess mucosal thickening, otherwise clear. Skeleton: No longus coli muscle calcifications. The longus coli muscles do not appear  enlarged. Normal CT appearance of the cervical spine. Mild degenerative changes at the right TMJ. No acute osseous abnormality identified. Upper chest: Visualized superior mediastinum appears within normal limits; there is a small volume of residual thymus. Negative lung  apices. Incidental occasional small new mat assessed in the left lung (Series 6, image 37). Bilateral axillary lymph nodes are somewhat numerous but within normal limits for age. IMPRESSION: 1. Relatively large trans spatial fluid collection in the deep neck, centered at the retropharyngeal space, but also involving the left greater than right carotid spaces, space around the left thyroid, and posterior left submandibular space. 2. No convincing soft tissue thickening or tonsillar abnormality in the pharynx. No CT changes of cervical discitis osteomyelitis. No changes of chronic longus coli tendonitis. No cervical lymphadenopathy. 3. There is a 2-2.5 cm round low-density cystic lesion expanding the left lobe of the thyroid, and appearing somewhat contiguous with the abnormal fluid in #1. 4. Top differential considerations include acute infection, spontaneous hemorrhage, angioedema/3rd spacing of fluid. 5. Recommend cervical spine MRI without and with contrast to tried a narrow the differential diagnosis. This was discussed by telephone with ED Provider LEISA TAPIA PA-C on 04/20/2015 at 16:52 . Electronically Signed   By: Genevie Ann M.D.   On: 04/20/2015 17:00   Mr Cervical Spine W Wo Contrast  04/20/2015  CLINICAL DATA:  30 year old female with several days of acute neck pain and dysphagia. Abnormal neck CT with IV contrast, raising the possibility of retropharyngeal or cervical spine infection 1618 hours today. EXAM: MRI CERVICAL SPINE WITHOUT AND WITH CONTRAST TECHNIQUE: Multiplanar and multiecho pulse sequences of the cervical spine, to include the craniocervical junction and cervicothoracic junction, were obtained according to standard protocol  without and with intravenous contrast. CONTRAST:  30mL MULTIHANCE GADOBENATE DIMEGLUMINE 529 MG/ML IV SOLN COMPARISON:  Neck CT today reported separately. FINDINGS: Mild reversal of cervical lordosis. No marrow edema or abnormal bone marrow signal in the cervical spine. No abnormal signal in the longus coli muscles. No cervical spine abnormal dural enhancement or epidural inflammation. Normal for age cervical and upper thoracic intervertebral disc signal. Cervicomedullary junction is within normal limits. Negative visualized posterior fossa structures. Spinal cord signal is within normal limits at all visualized levels. No cervical spinal stenosis. No abnormal intradural enhancement. The extensive trans spatial low-density which was thought related to fluid on the earlier neck CT demonstrates diffuse hyperenhancement, indicating this is trans spatial inflammation and/or soft tissue inflammation (cellulitis like). This same distribution of changes is observed as on the earlier CT, with retropharyngeal space predominance, but also significant involvement around the left thyroid lobe (series 3, image 18) where a 22 mm diameter round thyroid nodule is re- demonstrated. The dependent aspect of this nodule demonstrates loss of gradient echo imaging (series 7, image 31), and almost all of the nodule demonstrates intrinsic T1 hyperintensity. Elsewhere there is trace loss of gradient signal amidst the inflammation situated between the retropharynx and left carotid space (series 7, image 20). The larynx and pharynx soft tissue contours remain normal, with no pharyngeal soft tissue thickening or hyper enhancement. The sublingual space remains unaffected. However, the inflammatory changes are tracking into the left superior mediastinum (series 3, image 15). Negative lung apices. IMPRESSION: 1. This study is negative for: Cervical spine infection, abnormality of the longus coli muscles, or changes of acute pharyngeal infection.  2. However, the study is positive for extensive retropharyngeal inflammation - an acute retropharyngeal cellulitis appearance - that also tracks inferiorly surrounding the left thyroid lobe, and continues into the superior mediastinum. Despite the low density on the earlier neck CT, there is no fluid or fluid collection in the neck. 3. This is an unusual finding, and the etiology remains unclear. The  patient has had symptoms for several days, has acute leukocytosis on presentation, but by report is afebrile and nontoxic appearing. I discussed the findings and possible etiologies including both infectious and noninfectious inflammation by telephone with Jeannett Senior, PA-C in the ED. Electronically Signed   By: Genevie Ann M.D.   On: 04/20/2015 21:40    Oren Binet, MD  [facility] Pager:[phone]  If 7PM-7AM, please contact night-coverage [email or web address] Password [email or web address] 04/22/2015, 2:08 PM   LOS: 1 day

## 2015-04-23 DIAGNOSIS — Q605 Renal hypoplasia, unspecified: Secondary | ICD-10-CM

## 2015-04-23 DIAGNOSIS — Q602 Renal agenesis, unspecified: Secondary | ICD-10-CM

## 2015-04-23 LAB — CULTURE, GROUP A STREP (THRC)

## 2015-04-23 LAB — CBC
HEMATOCRIT: 32.1 % — AB (ref 36.0–46.0)
HEMOGLOBIN: 10.9 g/dL — AB (ref 12.0–15.0)
MCH: 31.8 pg (ref 26.0–34.0)
MCHC: 34 g/dL (ref 30.0–36.0)
MCV: 93.6 fL (ref 78.0–100.0)
Platelets: 349 10*3/uL (ref 150–400)
RBC: 3.43 MIL/uL — AB (ref 3.87–5.11)
RDW: 13.3 % (ref 11.5–15.5)
WBC: 10.9 10*3/uL — AB (ref 4.0–10.5)

## 2015-04-23 MED ORDER — AMOXICILLIN-POT CLAVULANATE 875-125 MG PO TABS: 1.0000 | ORAL_TABLET | Freq: Two times a day (BID) | ORAL | Status: DC

## 2015-04-23 MED ORDER — OXYCODONE HCL 5 MG PO TABS: 5.0000 mg | ORAL_TABLET | Freq: Four times a day (QID) | ORAL | Status: DC | PRN

## 2015-04-23 MED ORDER — IBUPROFEN 600 MG PO TABS: 600.0000 mg | ORAL_TABLET | Freq: Four times a day (QID) | ORAL | Status: DC | PRN

## 2015-04-23 NOTE — Discharge Summary (Signed)
PATIENT DETAILS Name: Connie Wells Age: 30 y.o. Sex: female Date of Birth: 1985-08-14 MRN: [id number]. Admitting Physician: Rise Patience, MD PCP:No PCP Per Patient  Admit Date: 04/20/2015 Discharge date: 04/23/2015  Recommendations for Outpatient Follow-up:  1. Ensure follow up with ENT-Dr Constance Holster 2. Has 2.5 cm cystic lesion in the left thyroid lobe-needs evaluation as outpatient 3. Please follow blood/Throat cultures till final  PRIMARY DISCHARGE DIAGNOSIS:  Principal Problem:   Pharyngitis Active Problems:   SOLITARY KIDNEY      PAST MEDICAL HISTORY: Past Medical History  Diagnosis Date  . Solitary kidney   . Herpes genitalia     DISCHARGE MEDICATIONS: Current Discharge Medication List    START taking these medications   Details  amoxicillin-clavulanate (AUGMENTIN) 875-125 MG tablet Take 1 tablet by mouth 2 (two) times daily. Qty: 22 tablet, Refills: 0    oxyCODONE (ROXICODONE) 5 MG immediate release tablet Take 1 tablet (5 mg total) by mouth every 6 (six) hours as needed for severe pain. Qty: 20 tablet, Refills: 0      CONTINUE these medications which have CHANGED   Details  ibuprofen (ADVIL,MOTRIN) 600 MG tablet Take 1 tablet (600 mg total) by mouth every 6 (six) hours as needed.      STOP taking these medications     Prenatal Vit-Fe Fumarate-FA (PRENATAL MULTIVITAMIN) TABS tablet      promethazine (PHENERGAN) 12.5 MG tablet         ALLERGIES:  No Known Allergies  BRIEF HPI:  See H&P, Labs, Consult and Test reports for all details in brief, patient is a 30 y.o. female with no significant past medical history presented with neck pain/some difficulty swallowing. Patient had CT scan of the neck followed by MRI of the C-spine which shows inflammation involving the retropharyngeal area extending up to the mediastinum. She was subsequently admitted for further evaluation and treatment.  CONSULTATIONS:   ENT  PERTINENT RADIOLOGIC  STUDIES: Dg Chest 2 View  04/20/2015  CLINICAL DATA:  30 year old with sore throat for 2 days. EXAM: CHEST  2 VIEW COMPARISON:  Radiographs 05/12/2006 FINDINGS: The heart size and mediastinal contours are normal. The lungs are clear. There is no pleural effusion or pneumothorax. No acute osseous findings are identified. IMPRESSION: Stable chest.  No active cardiopulmonary process. Electronically Signed   By: Richardean Sale M.D.   On: 04/20/2015 15:36   Ct Soft Tissue Neck W Contrast  04/20/2015  CLINICAL DATA:  30 year old female with neck pain swelling, dysphagia and limited range of motion for 2 days. Initial encounter. EXAM: CT NECK WITH CONTRAST TECHNIQUE: Multidetector CT imaging of the neck was performed using the standard protocol following the bolus administration of intravenous contrast. CONTRAST:  139mL OMNIPAQUE IOHEXOL 300 MG/ML  SOLN COMPARISON:  None. FINDINGS: Pharynx and larynx: Large retropharyngeal space effusion, with fluid tracking laterally to involve both carotid spaces, more so the left. From the left carotid space fluid also tracks anteriorly toward the left submandibular space (series 5 image 38). The fusion is maximal at the level of the hypopharynx corresponding to the C3 vertebral level, measuring nearly 10 mm AP. No rim enhancement. The nasopharynx appears normal. There is no definite oropharyngeal tonsillar enlargement or soft tissue thickening. The hypopharynx is effaced, but without definite pharyngeal soft tissue thickening. The epiglottis appears normal. The glottis is closed. Salivary glands: Fluid in the posterior aspect of the left submandibular space as described above, but both submandibular glands appear within normal limits. Negative sublingual space.  Negative parotid spaces. Thyroid: Enlarged left lobe of the thyroid containing a 04/16/2018 5 mm diameter hypodense nodule which appears contiguous with the posterior capsule of the left lobe. There is fluid surrounding  the left lobe of the thyroid (series 5, image 50) contiguous with the effusion described above, and there is heterogeneous hyperdensity along the posterior superior margin of the left thyroid lobe (series 5, image 49). The isthmus and right lobe have a more normal appearance. There is a small volume of fluid along the superior aspect of the right thyroid lobe. There is a mild degree of generalized thyromegaly. Lymph nodes: No cervical lymphadenopathy. No hyper enhancing or cystic lymph nodes are identified. Vascular: Major vascular structures in the neck and at the skullbase, including the left IJ remain patent. The left IJ is mildly effaced at the level of the thyroid. The right IJ appears dominant. The left vertebral artery appears dominant. Limited intracranial: Negative. Visualized orbits: Negative. Mastoids and visualized paranasal sinuses: Minimal maxillary sinus alveolar recess mucosal thickening, otherwise clear. Skeleton: No longus coli muscle calcifications. The longus coli muscles do not appear enlarged. Normal CT appearance of the cervical spine. Mild degenerative changes at the right TMJ. No acute osseous abnormality identified. Upper chest: Visualized superior mediastinum appears within normal limits; there is a small volume of residual thymus. Negative lung apices. Incidental occasional small new mat assessed in the left lung (Series 6, image 37). Bilateral axillary lymph nodes are somewhat numerous but within normal limits for age. IMPRESSION: 1. Relatively large trans spatial fluid collection in the deep neck, centered at the retropharyngeal space, but also involving the left greater than right carotid spaces, space around the left thyroid, and posterior left submandibular space. 2. No convincing soft tissue thickening or tonsillar abnormality in the pharynx. No CT changes of cervical discitis osteomyelitis. No changes of chronic longus coli tendonitis. No cervical lymphadenopathy. 3. There is a  2-2.5 cm round low-density cystic lesion expanding the left lobe of the thyroid, and appearing somewhat contiguous with the abnormal fluid in #1. 4. Top differential considerations include acute infection, spontaneous hemorrhage, angioedema/3rd spacing of fluid. 5. Recommend cervical spine MRI without and with contrast to tried a narrow the differential diagnosis. This was discussed by telephone with ED Provider LEISA TAPIA PA-C on 04/20/2015 at 16:52 . Electronically Signed   By: Genevie Ann M.D.   On: 04/20/2015 17:00   Mr Cervical Spine W Wo Contrast  04/20/2015  CLINICAL DATA:  30 year old female with several days of acute neck pain and dysphagia. Abnormal neck CT with IV contrast, raising the possibility of retropharyngeal or cervical spine infection 1618 hours today. EXAM: MRI CERVICAL SPINE WITHOUT AND WITH CONTRAST TECHNIQUE: Multiplanar and multiecho pulse sequences of the cervical spine, to include the craniocervical junction and cervicothoracic junction, were obtained according to standard protocol without and with intravenous contrast. CONTRAST:  66mL MULTIHANCE GADOBENATE DIMEGLUMINE 529 MG/ML IV SOLN COMPARISON:  Neck CT today reported separately. FINDINGS: Mild reversal of cervical lordosis. No marrow edema or abnormal bone marrow signal in the cervical spine. No abnormal signal in the longus coli muscles. No cervical spine abnormal dural enhancement or epidural inflammation. Normal for age cervical and upper thoracic intervertebral disc signal. Cervicomedullary junction is within normal limits. Negative visualized posterior fossa structures. Spinal cord signal is within normal limits at all visualized levels. No cervical spinal stenosis. No abnormal intradural enhancement. The extensive trans spatial low-density which was thought related to fluid on the earlier neck CT demonstrates diffuse  hyperenhancement, indicating this is trans spatial inflammation and/or soft tissue inflammation (cellulitis like).  This same distribution of changes is observed as on the earlier CT, with retropharyngeal space predominance, but also significant involvement around the left thyroid lobe (series 3, image 18) where a 22 mm diameter round thyroid nodule is re- demonstrated. The dependent aspect of this nodule demonstrates loss of gradient echo imaging (series 7, image 31), and almost all of the nodule demonstrates intrinsic T1 hyperintensity. Elsewhere there is trace loss of gradient signal amidst the inflammation situated between the retropharynx and left carotid space (series 7, image 20). The larynx and pharynx soft tissue contours remain normal, with no pharyngeal soft tissue thickening or hyper enhancement. The sublingual space remains unaffected. However, the inflammatory changes are tracking into the left superior mediastinum (series 3, image 15). Negative lung apices. IMPRESSION: 1. This study is negative for: Cervical spine infection, abnormality of the longus coli muscles, or changes of acute pharyngeal infection. 2. However, the study is positive for extensive retropharyngeal inflammation - an acute retropharyngeal cellulitis appearance - that also tracks inferiorly surrounding the left thyroid lobe, and continues into the superior mediastinum. Despite the low density on the earlier neck CT, there is no fluid or fluid collection in the neck. 3. This is an unusual finding, and the etiology remains unclear. The patient has had symptoms for several days, has acute leukocytosis on presentation, but by report is afebrile and nontoxic appearing. I discussed the findings and possible etiologies including both infectious and noninfectious inflammation by telephone with Jeannett Senior, PA-C in the ED. Electronically Signed   By: Genevie Ann M.D.   On: 04/20/2015 21:40     PERTINENT LAB RESULTS: CBC:  Recent Labs  04/22/15 0511 04/23/15 0341  WBC 17.9* 10.9*  HGB 11.4* 10.9*  HCT 33.6* 32.1*  PLT 328 349    CMET CMP     Component Value Date/Time   NA 137 04/21/2015 0422   K 4.4 04/21/2015 0422   CL 106 04/21/2015 0422   CO2 22 04/21/2015 0422   GLUCOSE 149* 04/21/2015 0422   BUN 9 04/21/2015 0422   CREATININE 0.91 04/21/2015 0422   CREATININE 0.58 07/06/2013 1612   CALCIUM 8.7* 04/21/2015 0422   PROT 7.2 04/21/2015 0422   ALBUMIN 2.8* 04/21/2015 0422   AST 14* 04/21/2015 0422   ALT 11* 04/21/2015 0422   ALKPHOS 46 04/21/2015 0422   BILITOT 0.3 04/21/2015 0422   GFRNONAA >60 04/21/2015 0422   GFRAA >60 04/21/2015 0422    GFR Estimated Creatinine Clearance: 79.5 mL/min (by C-G formula based on Cr of 0.91). No results for input(s): LIPASE, AMYLASE in the last 72 hours. No results for input(s): CKTOTAL, CKMB, CKMBINDEX, TROPONINI in the last 72 hours. Invalid input(s): POCBNP No results for input(s): DDIMER in the last 72 hours. No results for input(s): HGBA1C in the last 72 hours. No results for input(s): CHOL, HDL, LDLCALC, TRIG, CHOLHDL, LDLDIRECT in the last 72 hours. No results for input(s): TSH, T4TOTAL, T3FREE, THYROIDAB in the last 72 hours.  Invalid input(s): FREET3 No results for input(s): VITAMINB12, FOLATE, FERRITIN, TIBC, IRON, RETICCTPCT in the last 72 hours. Coags: No results for input(s): INR in the last 72 hours.  Invalid input(s): PT Microbiology: Recent Results (from the past 240 hour(s))  Rapid strep screen     Status: None   Collection Time: 04/20/15  2:50 PM  Result Value Ref Range Status   Streptococcus, Group A Screen (Direct) NEGATIVE NEGATIVE Final  Comment: (NOTE) A Rapid Antigen test may result negative if the antigen level in the sample is below the detection level of this test. The FDA has not cleared this test as a stand-alone test therefore the rapid antigen negative result has reflexed to a Group A Strep culture.   Culture, group A strep     Status: None (Preliminary result)   Collection Time: 04/20/15  2:50 PM  Result Value Ref  Range Status   Specimen Description THROAT  Final   Special Requests NONE Reflexed from [id number]  Final   Culture CULTURE REINCUBATED FOR BETTER GROWTH  Final   Report Status PENDING  Incomplete  Culture, blood (Routine X 2) w Reflex to ID Panel     Status: None (Preliminary result)   Collection Time: 04/20/15  5:57 PM  Result Value Ref Range Status   Specimen Description BLOOD RIGHT ANTECUBITAL  Final   Special Requests BOTTLES DRAWN AEROBIC AND ANAEROBIC 5CC  Final   Culture NO GROWTH 2 DAYS  Final   Report Status PENDING  Incomplete  Culture, blood (Routine X 2) w Reflex to ID Panel     Status: None (Preliminary result)   Collection Time: 04/20/15  6:04 PM  Result Value Ref Range Status   Specimen Description BLOOD RIGHT HAND  Final   Special Requests BOTTLES DRAWN AEROBIC AND ANAEROBIC 5CC  Final   Culture NO GROWTH 2 DAYS  Final   Report Status PENDING  Incomplete  MRSA PCR Screening     Status: None   Collection Time: 04/21/15  7:17 AM  Result Value Ref Range Status   MRSA by PCR NEGATIVE NEGATIVE Final    Comment:        The GeneXpert MRSA Assay (FDA approved for NASAL specimens only), is one component of a comprehensive MRSA colonization surveillance program. It is not intended to diagnose MRSA infection nor to guide or monitor treatment for MRSA infections.      BRIEF HOSPITAL COURSE:  Pharyngitis with retropharyngeal inflammation: Suspect infectious etiology-throat/blood cultures negative so far. Rapidly improved with empiric antibiotics, no feve, leukocytosis has resolved. Neck pain is much better-continues to have some mild intermittent pain in the left lower neck area.Since much better, suspect stable to be discharge home with oral augmentin. ENT was consulted during this hospital stay, recommendations were to continue with Abx and ENT follow up on discharge. Patient was encouraged to call [facility] ENT office tomorrow to make a appointment with Dr Constance Holster.   Active  Problems: 2.5 cm cystic lesion in the left thyroid lobe: Not sure if this is related to above. ENT follow-up as outpatient.  TODAY-DAY OF DISCHARGE:  Subjective:   Connie Wells today has no headache,no chest abdominal pain,no new weakness tingling or numbness, feels much better wants to go home today.   Objective:   Blood pressure 95/59, pulse 60, temperature 98.5 F (36.9 C), temperature source Oral, resp. rate 20, height 5\' 4"  (1.626 m), weight 59 kg (130 lb 1.1 oz), last menstrual period 04/01/2015, SpO2 100 %, not currently breastfeeding.  Intake/Output Summary (Last 24 hours) at 04/23/15 0945 Last data filed at 04/23/15 0859  Gross per 24 hour  Intake    120 ml  Output      0 ml  Net    120 ml   Filed Weights   04/20/15 1410 04/21/15 0000  Weight: 58.996 kg (130 lb 1 oz) 59 kg (130 lb 1.1 oz)    Exam Awake Alert, Oriented *3, No  new F.N deficits, Normal affect [place].AT,PERRAL Supple Neck,No JVD, No cervical lymphadenopathy appriciated.  Symmetrical Chest wall movement, Good air movement bilaterally, CTAB RRR,No Gallops,Rubs or new Murmurs, No Parasternal Heave +ve B.Sounds, Abd Soft, Non tender, No organomegaly appriciated, No rebound -guarding or rigidity. No Cyanosis, Clubbing or edema, No new Rash or bruise  DISCHARGE CONDITION: Stable  DISPOSITION: Home  DISCHARGE INSTRUCTIONS:    Activity:  As tolerated   Get Medicines reviewed and adjusted: Please take all your medications with you for your next visit with your Primary MD  Please request your Primary MD to go over all hospital tests and procedure/radiological results at the follow up, please ask your Primary MD to get all Hospital records sent to his/her office.  If you experience worsening of your admission symptoms, develop shortness of breath, life threatening emergency, suicidal or homicidal thoughts you must seek medical attention immediately by calling 911 or calling your MD immediately  if  symptoms less severe.  You must read complete instructions/literature along with all the possible adverse reactions/side effects for all the Medicines you take and that have been prescribed to you. Take any new Medicines after you have completely understood and accpet all the possible adverse reactions/side effects.   Do not drive when taking Pain medications.   Do not take more than prescribed Pain, Sleep and Anxiety Medications  Special Instructions: If you have smoked or chewed Tobacco  in the last 2 yrs please stop smoking, stop any regular Alcohol  and or any Recreational drug use.  Wear Seat belts while driving.  Please note  You were cared for by a hospitalist during your hospital stay. Once you are discharged, your primary care physician will handle any further medical issues. Please note that NO REFILLS for any discharge medications will be authorized once you are discharged, as it is imperative that you return to your primary care physician (or establish a relationship with a primary care physician if you do not have one) for your aftercare needs so that they can reassess your need for medications and monitor your lab values.   Diet recommendation: Regular Diet   Discharge Instructions    Call MD for:  difficulty breathing, headache or visual disturbances    Complete by:  As directed      Call MD for:  redness, tenderness, or signs of infection (pain, swelling, redness, odor or green/yellow discharge around incision site)    Complete by:  As directed      Call MD for:  severe uncontrolled pain    Complete by:  As directed      Call MD for:  temperature >100.4    Complete by:  As directed      Diet general    Complete by:  As directed      Increase activity slowly    Complete by:  As directed            Follow-up Information    Follow up with Izora Gala, MD In 2 weeks.   Specialty:  Otolaryngology   Contact information:   [address] [place] [address] [phone]       Follow up with [place]. Schedule an appointment as soon as possible for a visit in 1 week.   Why:  Hospital follow up   Contact information:   [place] [phone]     Total Time spent on discharge equals  25 minutes.  SignedOren Binet 04/23/2015 9:45 AM

## 2015-04-23 NOTE — Progress Notes (Signed)
Discharge instructions gave to patients and all questions answered. Patient is ready to discharge.

## 2015-04-24 MED FILL — oxyCODONE HCL 5 MG TABS: 5 | 5 days supply | Qty: 20 | Fill #0

## 2015-04-24 MED FILL — AMOX TR-K CLV 875-125 MG TA: 875-125 | 11 days supply | Qty: 22 | Fill #0

## 2015-04-25 LAB — CULTURE, BLOOD (ROUTINE X 2)
Culture: NO GROWTH
Culture: NO GROWTH

## 2016-04-17 IMAGING — US US OB FOLLOW-UP
1 series · 12 of 28 positions shown · non-contrast
Comparison: none

[Series 1: us ob follow-up · 0.23mm/px · 32 acquisitions, 12 frames shown]
[im 2/32]
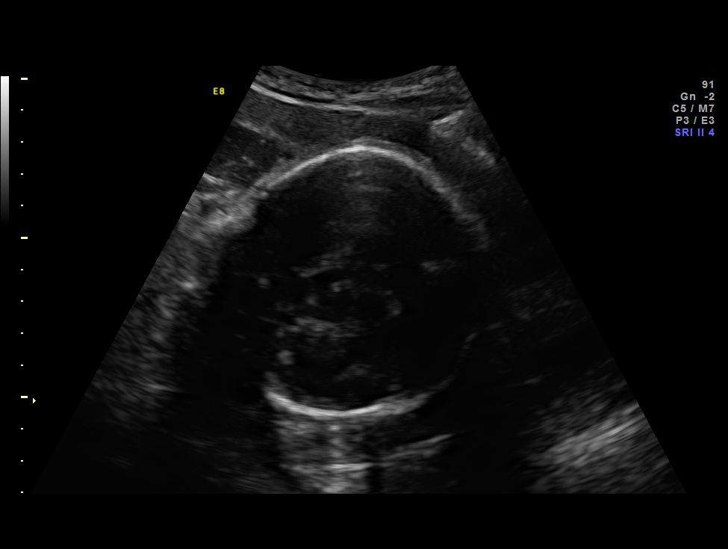
[im 4/32]
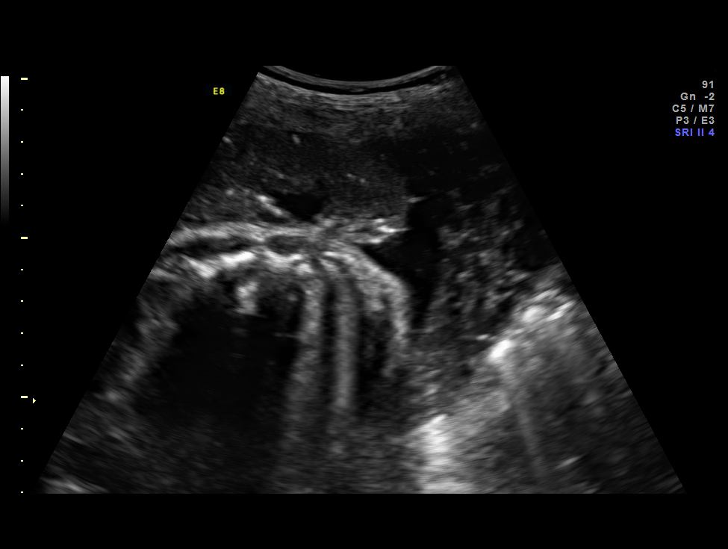
[im 6/32]
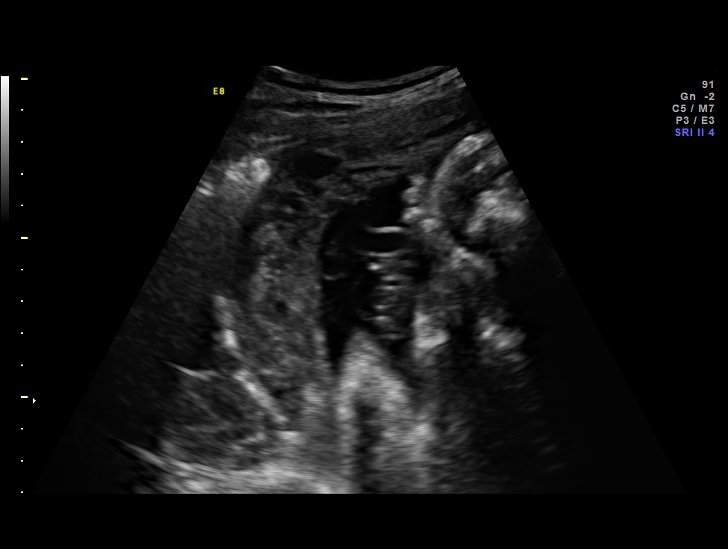
[im 10/32]
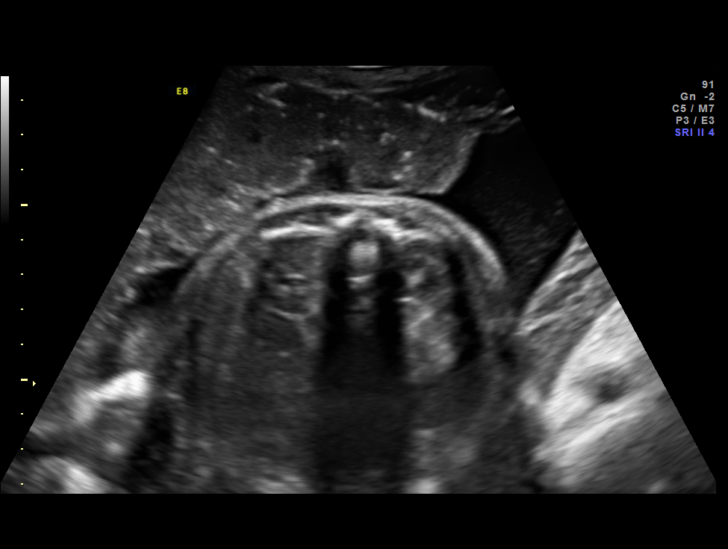
[im 12/32]
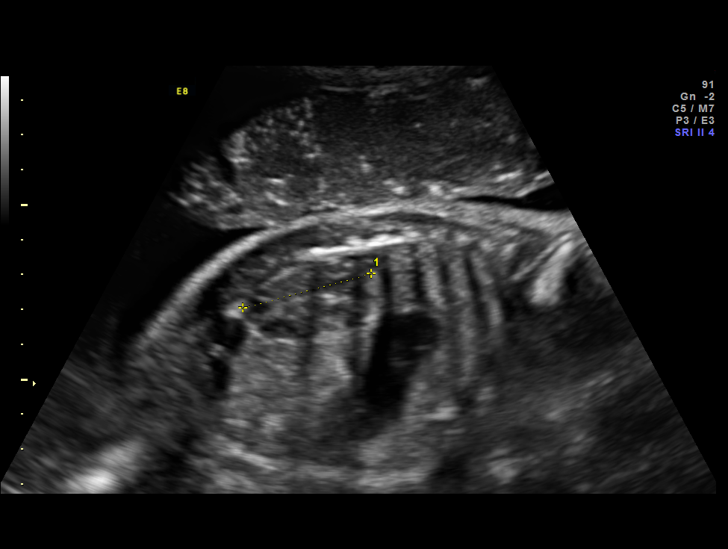
[im 14/32]
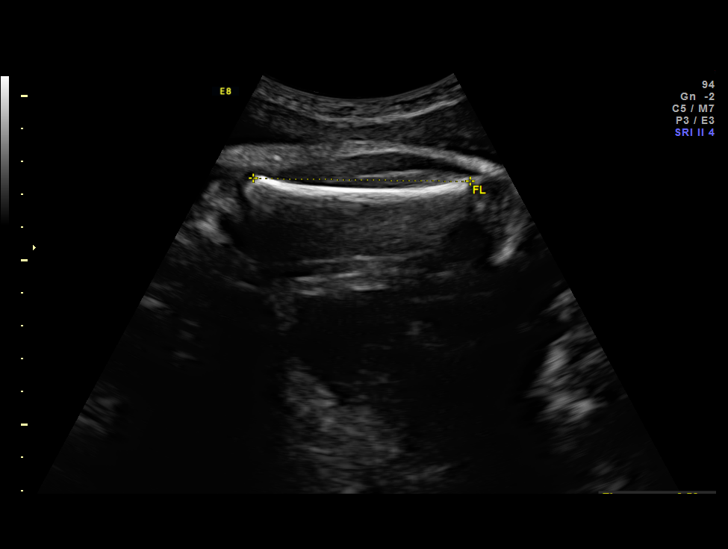
[im 18/32]
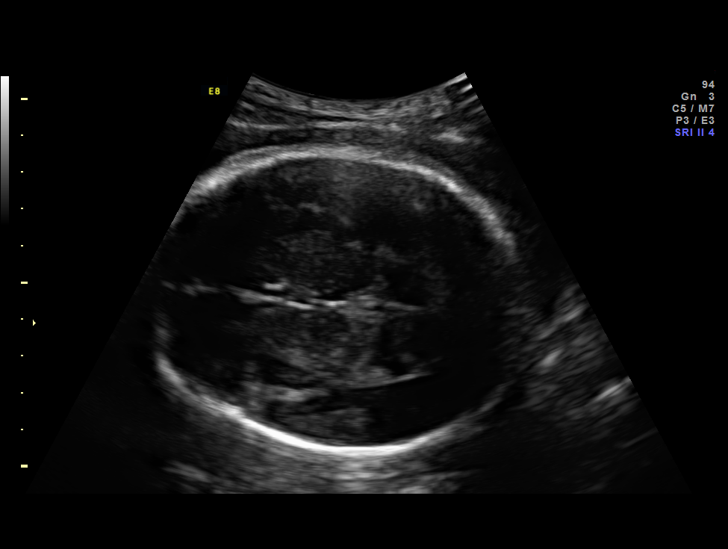
[im 20/32]
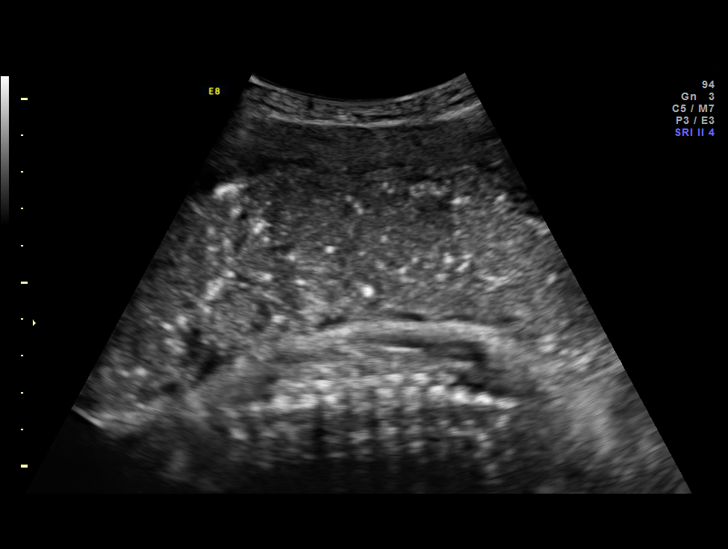
[im 22/32]
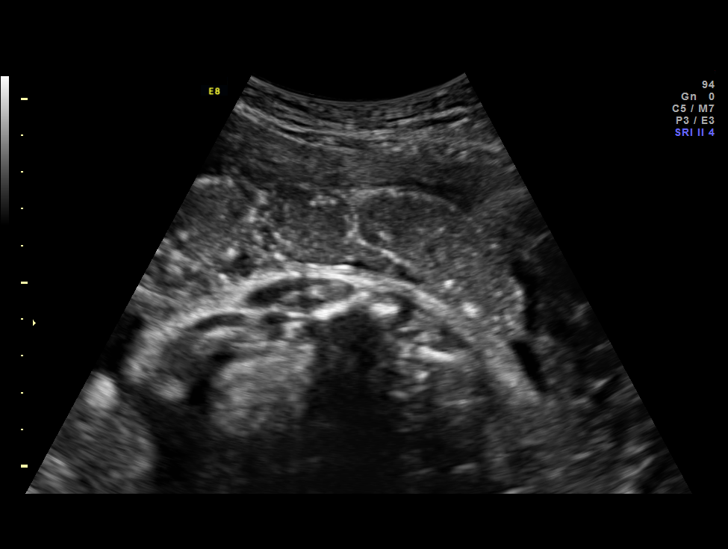
[im 26/32]
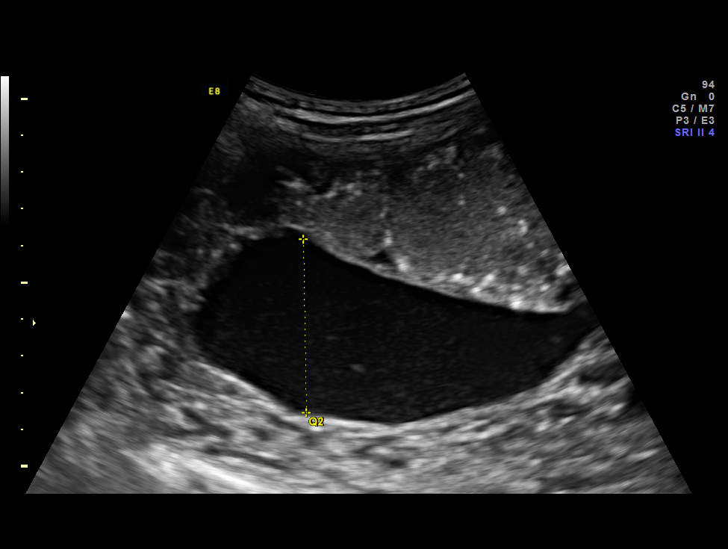
[im 28/32]
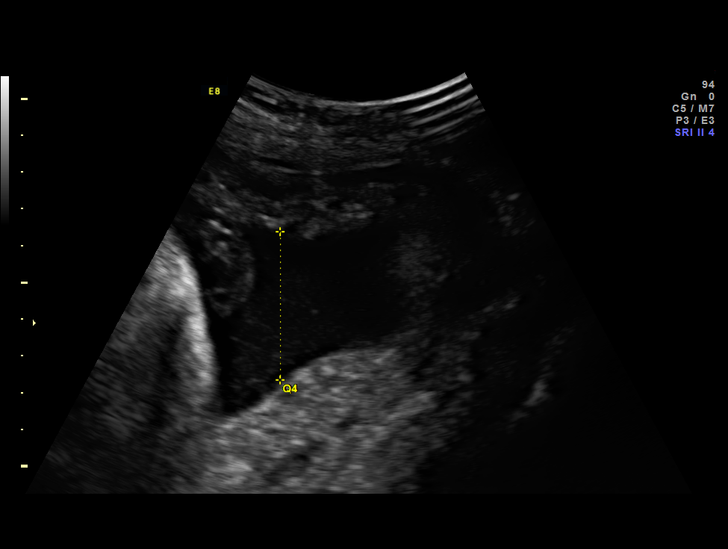
[im 30/32]
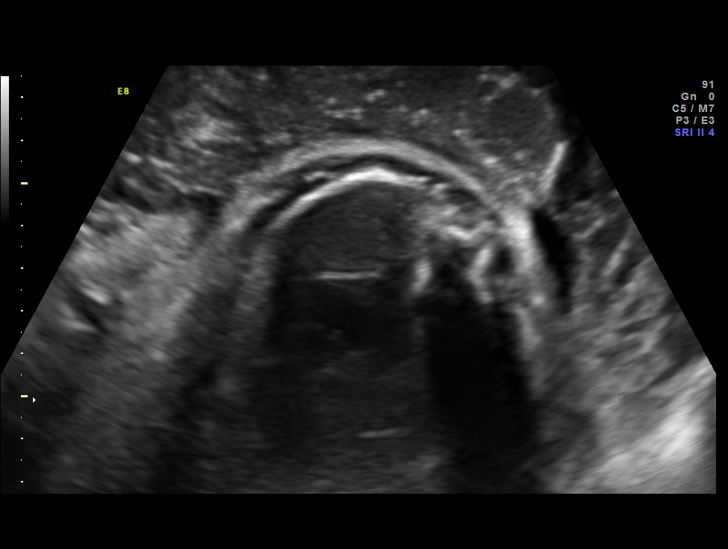

[12 of 28 positions shown; findings below may reference images not displayed]

OBSTETRICS REPORT
                      (Signed Final 11/08/2013 [DATE])

             AGYEMAN

Service(s) Provided

 US OB FOLLOW UP                                       76816.1
Indications

 Vaginal bleeding, unknown etiology
Fetal Evaluation

 Num Of Fetuses:    1
 Fetal Heart Rate:  132                          bpm
 Cardiac Activity:  Observed
 Presentation:      Cephalic
 Placenta:          Anterior, above cervical os
 P. Cord            Previously Visualized
 Insertion:

 Amniotic Fluid
 AFI FV:      Subjectively within normal limits
 AFI Sum:     17.34   cm       64  %Tile     Larg Pckt:    5.29  cm
 RUQ:   5.29    cm   RLQ:    4.03   cm    LUQ:   4.72    cm   LLQ:    3.3    cm
Biometry

 BPD:     79.9  mm     G. Age:  32w 0d                CI:         74.5   70 - 86
 OFD:    107.3  mm                                    FL/HC:      22.3   20.1 -

 HC:     297.7  mm     G. Age:  33w 0d      < 3  %    HC/AC:      0.98   0.93 -

 AC:     304.9  mm     G. Age:  34w 3d       34  %    FL/BPD:     83.0   71 - 87
 FL:      66.3  mm     G. Age:  34w 1d       17  %    FL/AC:      21.7   20 - 24

 Est. FW:    9152  gm      5 lb 2 oz     33  %
Gestational Age

 U/S Today:     33w 3d                                        EDD:   12/24/13
 Best:          35w 2d     Det. By:  Early Ultrasound         EDD:   12/11/13
Anatomy

 Cranium:          Appears normal         Aortic Arch:      Previously seen
 Fetal Cavum:      Previously seen        Ductal Arch:      Previously seen
 Ventricles:       Appears normal         Diaphragm:        Previously seen
 Choroid Plexus:   Previously seen        Stomach:          Appears normal, left
                                                            sided
 Cerebellum:       Previously seen        Abdomen:          Appears normal
 Posterior Fossa:  Previously seen        Abdominal Wall:   Previously seen
 Nuchal Fold:      Previously seen        Cord Vessels:     Previously seen
 Face:             Not well visualized    Kidneys:          Appear normal
 Lips:             Appears normal         Bladder:          Appears normal
 Heart:            Appears normal         Spine:            Previously seen
                   (4CH, axis, and
                   situs)
 RVOT:             Not well visualized    Lower             Previously seen
                                          Extremities:
 LVOT:             Previously seen        Upper             Previously seen
                                          Extremities:

 Other:  Female gender. Lt. Heel previoulsy visualized.Technically difficult due
         to fetal position.
Cervix Uterus Adnexa

 Adnexa:     No abnormality visualized.
Impression

 Single IUP at 35w 2d
 Patient currently admitte due to vaginal bleeding
 Fetal growth is appropriate (33rd %tile)
 Normal interval anatomy - somewhat limited views of the fetal
 heart obtained (RVOT)
 Calcified anterior placenta without previa
 No sonographic findings suggestive of abruption
 Normal amniotic fluid volume
Recommendations

 Recommend inpatient observation for 5-7 days without
 vaginal bleeding
 Follow-up ultrasounds as clinically indicated.

## 2021-05-11 ENCOUNTER — Other Ambulatory Visit: Payer: Self-pay

## 2021-05-11 ENCOUNTER — Ambulatory Visit (HOSPITAL_COMMUNITY)
Admission: RE | Admit: 2021-05-11 | Discharge: 2021-05-11 | Disposition: A | Discharge status: Home / Self Care | Payer: Medicaid Other | Source: Ambulatory Visit | Attending: Internal Medicine | Admitting: Internal Medicine

## 2021-05-11 VITALS — BP 130/72 | HR 81 | Wt 138.1 lb

## 2021-05-11 DIAGNOSIS — Q600 Renal agenesis, unilateral: Secondary | ICD-10-CM | POA: Insufficient documentation

## 2021-05-11 DIAGNOSIS — I427 Cardiomyopathy due to drug and external agent: Secondary | ICD-10-CM

## 2021-05-11 DIAGNOSIS — Z853 Personal history of malignant neoplasm of breast: Secondary | ICD-10-CM | POA: Insufficient documentation

## 2021-05-11 DIAGNOSIS — C50919 Malignant neoplasm of unspecified site of unspecified female breast: Secondary | ICD-10-CM

## 2021-05-11 DIAGNOSIS — F17210 Nicotine dependence, cigarettes, uncomplicated: Secondary | ICD-10-CM | POA: Diagnosis not present

## 2021-05-11 DIAGNOSIS — T451X5A Adverse effect of antineoplastic and immunosuppressive drugs, initial encounter: Secondary | ICD-10-CM

## 2021-05-11 DIAGNOSIS — I429 Cardiomyopathy, unspecified: Secondary | ICD-10-CM | POA: Diagnosis not present

## 2021-05-11 DIAGNOSIS — Z9221 Personal history of antineoplastic chemotherapy: Secondary | ICD-10-CM | POA: Diagnosis not present

## 2021-05-11 MED ORDER — CARVEDILOL 3.125 MG PO TABS: 3.1250 mg | ORAL_TABLET | Freq: Two times a day (BID) | ORAL | 3 refills | Status: DC

## 2021-05-11 MED ORDER — LOSARTAN POTASSIUM 25 MG PO TABS: 25.0000 mg | ORAL_TABLET | Freq: Every day | ORAL | 3 refills | Status: DC

## 2021-05-11 NOTE — Progress Notes (Signed)
? ?CARDIO-ONCOLOGY CLINIC CONSULT NOTE ? ?Referring Physician: Dr. Marcy Panning ?Primary Care: Patient, No Pcp Per (Inactive) ? ? ?HPI:  [name] is a 36 y.o. female with h/o tobacco use, congenital solitary kidney and stage IV right breast cancer  ER/PR - Hercpetin +) referred by Dr. Chancy Milroy for enrollment into the Cardio-Oncology program. ? ?Diagnosed with R breast cancer in 5/22. Underwent 6 cycles of induction THP in 6/22 and then started on H/P q 3 weeks.  ? ?- Echo 6/22 EF 60-65% GLS -17.4% ?- Echo 1/23 EF 55% GLS -18.2 mild to moderate RVSP 40mHG ? ?Denies h/o any heart problems. Feels great. Denies CP or SOB. Still smoking a few cigarettes per day  ? ? ?Review of Systems: [y] = yes, [ ]  = no  ? ?General: Weight gain [ ] ; Weight loss [ ] ; Anorexia [ ] ; Fatigue [ ] ; Fever [ ] ; Chills [ ] ; Weakness [ ]   ?Cardiac: Chest pain/pressure [ ] ; Resting SOB [ ] ; Exertional SOB [ ] ; Orthopnea [ ] ; Pedal Edema [ ] ; Palpitations [ ] ; Syncope [ ] ; Presyncope [ ] ; Paroxysmal nocturnal dyspnea[ ]   ?Pulmonary: Cough [ ] ; Wheezing[ ] ; Hemoptysis[ ] ; Sputum [ ] ; Snoring [ ]   ?GI: Vomiting[ ] ; Dysphagia[ ] ; Melena[ ] ; Hematochezia [ ] ; Heartburn[ ] ; Abdominal pain [ ] ; Constipation [ ] ; Diarrhea [ ] ; BRBPR [ ]   ?GU: Hematuria[ ] ; Dysuria [ ] ; Nocturia[ ]   ?Vascular: Pain in legs with walking [ ] ; Pain in feet with lying flat [ ] ; Non-healing sores [ ] ; Stroke [ ] ; TIA [ ] ; Slurred speech [ ] ;  ?Neuro: Headaches[ ] ; Vertigo[ ] ; Seizures[ ] ; Paresthesias[ ] ;Blurred vision [ ] ; Diplopia [ ] ; Vision changes [ ]   ?Ortho/Skin: Arthritis [ ] ; Joint pain [ ] ; Muscle pain [ ] ; Joint swelling [ ] ; Back Pain [ ] ; Rash [ ]   ?Psych: Depression[ ] ; Anxiety[ ]   ?Heme: Bleeding problems [ ] ; Clotting disorders [ ] ; Anemia [ ]   ?Endocrine: Diabetes [ ] ; Thyroid dysfunction[ ]  ? ? ?Past Medical History:  ?Diagnosis Date  ? Herpes genitalia   ? Solitary kidney   ? ? ?Current Outpatient Medications  ?Medication Sig Dispense Refill  ? ibuprofen  (ADVIL,MOTRIN) 600 MG tablet Take 1 tablet (600 mg total) by mouth every 6 (six) hours as needed.    ? lidocaine-prilocaine (EMLA) cream Apply topically.    ? LORazepam (ATIVAN) 1 MG tablet One tablet every 8 hours as needed for anxiety and at bedtime    ? ondansetron (ZOFRAN-ODT) 8 MG disintegrating tablet Take by mouth.    ? prochlorperazine (COMPAZINE) 10 MG tablet Take by mouth.    ? ?No current facility-administered medications for this encounter.  ? ? ?No Known Allergies ? ?  ?Social History  ? ?Socioeconomic History  ? Marital status: Single  ?  Spouse name: Not on file  ? Number of children: Not on file  ? Years of education: Not on file  ? Highest education level: Not on file  ?Occupational History  ? Not on file  ?Tobacco Use  ? Smoking status: Every Day  ?  Packs/day: 0.50  ?  Years: 5.00  ?  Pack years: 2.50  ?  Types: Cigarettes  ? Smokeless tobacco: Never  ?Substance and Sexual Activity  ? Alcohol use: No  ?  Comment: Occassional  ? Drug use: Yes  ?  Types: Marijuana  ? Sexual activity: Yes  ?  Birth control/protection: None  ?Other Topics Concern  ?  Not on file  ?Social History Narrative  ? Not on file  ? ?Social Determinants of Health  ? ?Financial Resource Strain: Not on file  ?Food Insecurity: Not on file  ?Transportation Needs: Not on file  ?Physical Activity: Not on file  ?Stress: Not on file  ?Social Connections: Not on file  ?Intimate Partner Violence: Not on file  ? ? ?  ?Family History  ?Problem Relation Age of Onset  ? Diabetes Mother   ? Hypertension Mother   ? Diabetes Maternal Aunt   ? Asthma Brother   ? ? ?Vitals:  ? 05/11/21 1206  ?BP: 130/72  ?Pulse: 81  ?SpO2: 98%  ?Weight: 62.7 kg (138 lb 2 oz)  ? ? ?PHYSICAL EXAM: ?General:  Well appearing. No respiratory difficulty ?HEENT: normal ?Neck: supple. no JVD. Carotids 2+ bilat; no bruits. No lymphadenopathy or thryomegaly appreciated. ?Cor: PMI nondisplaced. Regular rate & rhythm. No rubs, gallops or murmurs. + port ?Lungs:  clear ?Abdomen: soft, nontender, nondistended. No hepatosplenomegaly. No bruits or masses. Good bowel sounds. ?Extremities: no cyanosis, clubbing, rash, edema ?Neuro: alert & oriented x 3, cranial nerves grossly intact. moves all 4 extremities w/o difficulty. Affect pleasant. ? ? ?ASSESSMENT & PLAN: ? ?1 Right Breast Cancer, stage IV (widely metastatic)  ?- diagnosed 5/22. ER/PR - HER-2 + ?- s/p induction treatment with Taxotere, Herceptin, and pertuzumab x 6 cycles starting in 6/22 ?- now on H/P q 3 weeks ? ?2. Herceptin induced cardiomyopathy ?- Echo 6/22 EF 60-65% GLS -17.4% ?- Echo 1/23 EF 55% GLS -18.2 ?- Echo today in clinic EF 50-55%. Suspect early Herceptin cardiotoxcity ?- Start carvedilol 3.125 bid and losartan 25 qhs ?- Will get 2 more cycles H/P  then repeat echo with me in 5-6 weeks ? ? ?Connie Bickers, MD  ?12:16 PM ? ? ? ?

## 2021-05-11 NOTE — Patient Instructions (Signed)
Start Losartan 25 mg daily AT BEDTIME ? ?Start Carvediolol 3.125 mg Twice daily  ? ?Your physician recommends that you schedule a follow-up appointment in: 6 weeks with echocardiogram ? ?If you have any questions or concerns before your next appointment please send [place] a message through [place] or call our office at [phone].   ? ?TO LEAVE A MESSAGE FOR THE NURSE SELECT OPTION 2, PLEASE LEAVE A MESSAGE INCLUDING: ?YOUR NAME ?DATE OF BIRTH ?CALL BACK NUMBER ?REASON FOR CALL**this is important as we prioritize the call backs ? ?YOU WILL RECEIVE A CALL BACK THE SAME DAY AS LONG AS YOU CALL BEFORE 4:00 PM ? ?

## 2021-06-08 ENCOUNTER — Encounter (HOSPITAL_COMMUNITY): Payer: Medicaid Other | Admitting: Internal Medicine

## 2021-06-08 ENCOUNTER — Ambulatory Visit (HOSPITAL_COMMUNITY): Admission: RE | Admit: 2021-06-08 | Payer: Medicaid Other | Source: Ambulatory Visit

## 2021-07-19 ENCOUNTER — Ambulatory Visit (HOSPITAL_COMMUNITY): Admission: RE | Admit: 2021-07-19 | Payer: Medicaid Other | Source: Ambulatory Visit

## 2021-07-20 ENCOUNTER — Encounter (HOSPITAL_COMMUNITY): Payer: Self-pay | Admitting: Internal Medicine

## 2021-07-20 ENCOUNTER — Ambulatory Visit (HOSPITAL_BASED_OUTPATIENT_CLINIC_OR_DEPARTMENT_OTHER)
Admission: RE | Admit: 2021-07-20 | Discharge: 2021-07-20 | Disposition: A | Discharge status: Home / Self Care | Payer: Medicaid Other | Source: Ambulatory Visit | Attending: Internal Medicine | Admitting: Internal Medicine

## 2021-07-20 ENCOUNTER — Ambulatory Visit (HOSPITAL_COMMUNITY)
Admission: RE | Admit: 2021-07-20 | Discharge: 2021-07-20 | Disposition: A | Discharge status: Home / Self Care | Payer: Medicaid Other | Source: Ambulatory Visit | Attending: Internal Medicine | Admitting: Internal Medicine

## 2021-07-20 VITALS — BP 140/88 | HR 82 | Wt 138.2 lb

## 2021-07-20 DIAGNOSIS — I10 Essential (primary) hypertension: Secondary | ICD-10-CM

## 2021-07-20 DIAGNOSIS — Z79811 Long term (current) use of aromatase inhibitors: Secondary | ICD-10-CM | POA: Insufficient documentation

## 2021-07-20 DIAGNOSIS — Z1501 Genetic susceptibility to malignant neoplasm of breast: Secondary | ICD-10-CM | POA: Insufficient documentation

## 2021-07-20 DIAGNOSIS — C50919 Malignant neoplasm of unspecified site of unspecified female breast: Secondary | ICD-10-CM

## 2021-07-20 DIAGNOSIS — I427 Cardiomyopathy due to drug and external agent: Secondary | ICD-10-CM | POA: Diagnosis not present

## 2021-07-20 DIAGNOSIS — Z5181 Encounter for therapeutic drug level monitoring: Secondary | ICD-10-CM | POA: Insufficient documentation

## 2021-07-20 DIAGNOSIS — Z79899 Other long term (current) drug therapy: Secondary | ICD-10-CM | POA: Insufficient documentation

## 2021-07-20 DIAGNOSIS — I119 Hypertensive heart disease without heart failure: Secondary | ICD-10-CM | POA: Insufficient documentation

## 2021-07-20 DIAGNOSIS — Q600 Renal agenesis, unilateral: Secondary | ICD-10-CM | POA: Diagnosis not present

## 2021-07-20 DIAGNOSIS — C50911 Malignant neoplasm of unspecified site of right female breast: Secondary | ICD-10-CM | POA: Diagnosis present

## 2021-07-20 DIAGNOSIS — T451X5A Adverse effect of antineoplastic and immunosuppressive drugs, initial encounter: Secondary | ICD-10-CM | POA: Diagnosis not present

## 2021-07-20 DIAGNOSIS — E119 Type 2 diabetes mellitus without complications: Secondary | ICD-10-CM | POA: Diagnosis not present

## 2021-07-20 DIAGNOSIS — Z0189 Encounter for other specified special examinations: Secondary | ICD-10-CM

## 2021-07-20 DIAGNOSIS — T451X5D Adverse effect of antineoplastic and immunosuppressive drugs, subsequent encounter: Secondary | ICD-10-CM | POA: Insufficient documentation

## 2021-07-20 DIAGNOSIS — C799 Secondary malignant neoplasm of unspecified site: Secondary | ICD-10-CM | POA: Insufficient documentation

## 2021-07-20 DIAGNOSIS — I429 Cardiomyopathy, unspecified: Secondary | ICD-10-CM

## 2021-07-20 DIAGNOSIS — F17210 Nicotine dependence, cigarettes, uncomplicated: Secondary | ICD-10-CM | POA: Diagnosis not present

## 2021-07-20 LAB — ECHOCARDIOGRAM COMPLETE
Area-P 1/2: 5.2 cm2
S' Lateral: 3.3 cm
Single Plane A4C EF: 44.1 %

## 2021-07-20 MED ORDER — LOSARTAN POTASSIUM 50 MG PO TABS: 25.0000 mg | ORAL_TABLET | Freq: Every day | ORAL | 3 refills | Status: DC

## 2021-07-20 NOTE — Patient Instructions (Signed)
Increase Losartan to '50mg'$  daily.  Your physician has requested that you have an echocardiogram. Echocardiography is a painless test that uses sound waves to create images of your heart. It provides your doctor with information about the size and shape of your heart and how well your heart's chambers and valves are working. This procedure takes approximately one hour. There are no restrictions for this procedure.  Your physician recommends that you schedule a follow-up appointment in: 3 months.  If you have any questions or concerns before your next appointment please send [place] a message through [place] or call our office at [phone].    TO LEAVE A MESSAGE FOR THE NURSE SELECT OPTION 2, PLEASE LEAVE A MESSAGE INCLUDING: YOUR NAME DATE OF BIRTH CALL BACK NUMBER REASON FOR CALL**this is important as we prioritize the call backs  YOU WILL RECEIVE A CALL BACK THE SAME DAY AS LONG AS YOU CALL BEFORE 4:00 PM  At the [facility], you and your health needs are our priority. As part of our continuing mission to provide you with exceptional heart care, we have created designated Provider Care Teams. These Care Teams include your primary Cardiologist (physician) and Advanced Practice Providers (APPs- Physician Assistants and Nurse Practitioners) who all work together to provide you with the care you need, when you need it.   You may see any of the following providers on your designated Care Team at your next follow up: Dr Glori Bickers Dr Haynes Kerns, NP Lyda Jester, [place] [facility] [place], [place] Audry Riles, PharmD   Please be sure to bring in all your medications bottles to every appointment.

## 2021-07-20 NOTE — Progress Notes (Signed)
  Echocardiogram 2D Echocardiogram has been performed.  Connie Wells 07/20/2021, 8:47 AM

## 2021-07-20 NOTE — Addendum Note (Signed)
Encounter addended by: Jerl Mina, RN on: 07/20/2021 4:15 PM  Actions taken: Order list changed, Diagnosis association updated, Clinical Note Signed

## 2021-07-20 NOTE — Progress Notes (Signed)
CARDIO-ONCOLOGY CLINIC CONSULT NOTE  Referring Physician: Dr. Marcy Wells Primary Care: Default, Provider, MD   HPI:  [name] is a 36 y.o. female with h/o tobacco use, congenital solitary kidney and stage IV right breast cancer  ER/PR - Hercpetin +) referred by Dr. Chancy Wells for enrollment into the Cardio-Oncology program.  Diagnosed with R breast cancer in 5/22. Underwent 6 cycles of induction THP in 6/22 and then started on H/P q 3 weeks.   - Echo 6/22 EF 60-65% GLS -17.4% - Echo 1/23 EF 55% GLS -18.2 mild to moderate RVSP 36mHG  Doing great. Back on herceptin. No SOB, edema, orthopnea or PND.   Echo today 07/20/21 55-60%     Past Medical History:  Diagnosis Date   Herpes genitalia    Solitary kidney     Current Outpatient Medications  Medication Sig Dispense Refill   ibuprofen (ADVIL,MOTRIN) 600 MG tablet Take 1 tablet (600 mg total) by mouth every 6 (six) hours as needed.     LORazepam (ATIVAN) 1 MG tablet Take 1 mg by mouth as needed for anxiety.     carvedilol (COREG) 3.125 MG tablet Take 1 tablet (3.125 mg total) by mouth 2 (two) times daily. (Patient not taking: Reported on 07/20/2021) 60 tablet 3   losartan (COZAAR) 25 MG tablet Take 1 tablet (25 mg total) by mouth at bedtime. (Patient not taking: Reported on 07/20/2021) 30 tablet 3   No current facility-administered medications for this encounter.    No Known Allergies    Social History   Socioeconomic History   Marital status: Single    Spouse name: Not on file   Number of children: Not on file   Years of education: Not on file   Highest education level: Not on file  Occupational History   Not on file  Tobacco Use   Smoking status: Every Day    Packs/day: 0.50    Years: 5.00    Pack years: 2.50    Types: Cigarettes   Smokeless tobacco: Never  Substance and Sexual Activity   Alcohol use: No    Comment: Occassional   Drug use: Yes    Types: Marijuana   Sexual activity: Yes    Birth  control/protection: None  Other Topics Concern   Not on file  Social History Narrative   Not on file   Social Determinants of Health   Financial Resource Strain: Not on file  Food Insecurity: Not on file  Transportation Needs: Not on file  Physical Activity: Not on file  Stress: Not on file  Social Connections: Not on file  Intimate Partner Violence: Not on file      Family History  Problem Relation Age of Onset   Diabetes Mother    Hypertension Mother    Diabetes Maternal Aunt    Asthma Brother     Vitals:   07/20/21 1538  BP: 140/88  Pulse: 82  SpO2: 98%  Weight: 62.7 kg (138 lb 3.2 oz)    PHYSICAL EXAM: General:  Well appearing. No resp difficulty HEENT: normal Neck: supple. no JVD. Carotids 2+ bilat; no bruits. No lymphadenopathy or thryomegaly appreciated. Cor: PMI nondisplaced. Regular rate & rhythm. No rubs, gallops or murmurs. Lungs: clear Abdomen: soft, nontender, nondistended. No hepatosplenomegaly. No bruits or masses. Good bowel sounds. Extremities: no cyanosis, clubbing, rash, edema Neuro: alert & orientedx3, cranial nerves grossly intact. moves all 4 extremities w/o difficulty. Affect pleasant  ASSESSMENT & PLAN:  1 Right Breast Cancer, stage IV (  widely metastatic)  - diagnosed 5/22. ER/PR - HER-2 + - s/p induction treatment with Taxotere, Herceptin, and pertuzumab x 6 cycles starting in 6/22 - now on H/P q 3 weeks  2. Herceptin induced cardiomyopathy - Echo 6/22 EF 60-65% GLS -17.4% - Echo 1/23 EF 55% GLS -18.2 - Echo 3/23 in clinic EF 50-55%. Suspect early Herceptin cardiotoxcity - Echo today 07/20/21 EF 55-60% (EF normalized) - Continue carvedilol 3.125 bid - BP high. Will increase losartan to 50 qhs - EF stable back on herceptin. Can continue Herceptin - I d/w Connie Wells - Repeat echo in 3 months  3. HTN - increase losartan   Connie Bickers, MD  4:04 PM

## 2021-11-06 ENCOUNTER — Encounter (HOSPITAL_COMMUNITY): Payer: Medicaid Other | Admitting: Internal Medicine

## 2021-11-06 ENCOUNTER — Ambulatory Visit (HOSPITAL_COMMUNITY): Admission: RE | Admit: 2021-11-06 | Payer: Medicaid Other | Source: Ambulatory Visit

## 2021-11-23 ENCOUNTER — Ambulatory Visit (HOSPITAL_BASED_OUTPATIENT_CLINIC_OR_DEPARTMENT_OTHER)
Admission: RE | Admit: 2021-11-23 | Discharge: 2021-11-23 | Disposition: A | Discharge status: Home / Self Care | Payer: Medicaid Other | Source: Ambulatory Visit | Attending: Internal Medicine | Admitting: Internal Medicine

## 2021-11-23 ENCOUNTER — Ambulatory Visit (HOSPITAL_COMMUNITY)
Admission: RE | Admit: 2021-11-23 | Discharge: 2021-11-23 | Disposition: A | Discharge status: Home / Self Care | Payer: Medicaid Other | Source: Ambulatory Visit | Attending: Internal Medicine | Admitting: Internal Medicine

## 2021-11-23 ENCOUNTER — Encounter (HOSPITAL_COMMUNITY): Payer: Self-pay | Admitting: Internal Medicine

## 2021-11-23 VITALS — BP 144/82 | HR 83 | Wt 138.0 lb

## 2021-11-23 DIAGNOSIS — I517 Cardiomegaly: Secondary | ICD-10-CM

## 2021-11-23 DIAGNOSIS — F17210 Nicotine dependence, cigarettes, uncomplicated: Secondary | ICD-10-CM | POA: Insufficient documentation

## 2021-11-23 DIAGNOSIS — Z79899 Other long term (current) drug therapy: Secondary | ICD-10-CM | POA: Diagnosis not present

## 2021-11-23 DIAGNOSIS — C799 Secondary malignant neoplasm of unspecified site: Secondary | ICD-10-CM | POA: Diagnosis not present

## 2021-11-23 DIAGNOSIS — I427 Cardiomyopathy due to drug and external agent: Secondary | ICD-10-CM | POA: Insufficient documentation

## 2021-11-23 DIAGNOSIS — C50919 Malignant neoplasm of unspecified site of unspecified female breast: Secondary | ICD-10-CM

## 2021-11-23 DIAGNOSIS — I509 Heart failure, unspecified: Secondary | ICD-10-CM | POA: Insufficient documentation

## 2021-11-23 DIAGNOSIS — Z9221 Personal history of antineoplastic chemotherapy: Secondary | ICD-10-CM | POA: Diagnosis not present

## 2021-11-23 DIAGNOSIS — Z853 Personal history of malignant neoplasm of breast: Secondary | ICD-10-CM | POA: Insufficient documentation

## 2021-11-23 DIAGNOSIS — T451X5A Adverse effect of antineoplastic and immunosuppressive drugs, initial encounter: Secondary | ICD-10-CM | POA: Insufficient documentation

## 2021-11-23 DIAGNOSIS — I10 Essential (primary) hypertension: Secondary | ICD-10-CM

## 2021-11-23 DIAGNOSIS — I110 Hypertensive heart disease with heart failure: Secondary | ICD-10-CM | POA: Insufficient documentation

## 2021-11-23 DIAGNOSIS — Q600 Renal agenesis, unilateral: Secondary | ICD-10-CM | POA: Diagnosis not present

## 2021-11-23 LAB — ECHOCARDIOGRAM COMPLETE
Area-P 1/2: 6.32 cm2
S' Lateral: 3.8 cm

## 2021-11-23 MED ORDER — LOSARTAN POTASSIUM 25 MG PO TABS: 25.0000 mg | ORAL_TABLET | Freq: Every day | ORAL | 3 refills | Status: DC

## 2021-11-23 NOTE — Patient Instructions (Signed)
Restart Losartan '25mg'$  daily.  Blood work in 3 weeks  Your physician has requested that you have an echocardiogram. Echocardiography is a painless test that uses sound waves to create images of your heart. It provides your doctor with information about the size and shape of your heart and how well your heart's chambers and valves are working. This procedure takes approximately one hour. There are no restrictions for this procedure.  Your physician recommends that you schedule a follow-up appointment in: 4 months with echocardiogram ( January 2024)  ** please call the office in November to arrange your follow up appointment **  If you have any questions or concerns before your next appointment please send [place] a message through [place] or call our office at [phone].    TO LEAVE A MESSAGE FOR THE NURSE SELECT OPTION 2, PLEASE LEAVE A MESSAGE INCLUDING: YOUR NAME DATE OF BIRTH CALL BACK NUMBER REASON FOR CALL**this is important as we prioritize the call backs  YOU WILL RECEIVE A CALL BACK THE SAME DAY AS LONG AS YOU CALL BEFORE 4:00 PM  At the [facility], you and your health needs are our priority. As part of our continuing mission to provide you with exceptional heart care, we have created designated Provider Care Teams. These Care Teams include your primary Cardiologist (physician) and Advanced Practice Providers (APPs- Physician Assistants and Nurse Practitioners) who all work together to provide you with the care you need, when you need it.   You may see any of the following providers on your designated Care Team at your next follow up: Dr Glori Bickers Dr Loralie Champagne Dr. Roxana Hires, NP Lyda Jester, [place] [facility] [place], [place] Forestine Na, NP Audry Riles, PharmD   Please be sure to bring in all your medications bottles to every appointment.

## 2021-11-23 NOTE — Progress Notes (Signed)
CARDIO-ONCOLOGY CLINIC CONSULT NOTE  Referring Physician: Dr. Marcy Panning Primary Care: none HF Cardiologist: Dr. Haroldine Laws   HPI:  [name] is a 36 y.o. female with h/o tobacco use, congenital solitary kidney and stage IV right breast cancer  ER/PR - Hercpetin +) referred by Dr. Chancy Milroy for enrollment into the Cardio-Oncology program.  Diagnosed with R breast cancer in 5/22. Underwent 6 cycles of induction THP in 6/22 and then started on H/P q 3 weeks.   - Echo 6/22 EF 60-65% GLS -17.4% - Echo 1/23 EF 55% GLS -18.2 mild to moderate RVSP 23mHG - Echo 07/20/21 55-60%   Today she returns for HF follow up. Overall feeling fine. She has been finished with chemo x 1 year today. Doing OK on herceptin x 3 weeks, indefinitely. Denies increasing SOB, CP, dizziness, edema, or PND/Orthopnea. Appetite ok. No fever or chills. She stopped her Coreg and losartan, had trouble remembering to take them. Occasionally smokes cigarettes, smokes THC daily, occasional ETOH, no drugs. Has 2 daughters.  Echo today 11/23/21, EF 55-60%.  Past Medical History:  Diagnosis Date   Herpes genitalia    Solitary kidney    Current Outpatient Medications  Medication Sig Dispense Refill   LORazepam (ATIVAN) 1 MG tablet Take 1 mg by mouth every 8 (eight) hours. As needed     No current facility-administered medications for this encounter.   No Known Allergies  Social History   Socioeconomic History   Marital status: Single    Spouse name: Not on file   Number of children: Not on file   Years of education: Not on file   Highest education level: Not on file  Occupational History   Not on file  Tobacco Use   Smoking status: Every Day    Packs/day: 0.50    Years: 5.00    Total pack years: 2.50    Types: Cigarettes   Smokeless tobacco: Never  Substance and Sexual Activity   Alcohol use: No    Comment: Occassional   Drug use: Yes    Types: Marijuana   Sexual activity: Yes    Birth control/protection:  None  Other Topics Concern   Not on file  Social History Narrative   Not on file   Social Determinants of Health   Financial Resource Strain: Not on file  Food Insecurity: Not on file  Transportation Needs: Not on file  Physical Activity: Not on file  Stress: Not on file  Social Connections: Not on file  Intimate Partner Violence: Not on file   Family History  Problem Relation Age of Onset   Diabetes Mother    Hypertension Mother    Diabetes Maternal Aunt    Asthma Brother    BP (!) 144/82   Pulse 83   Wt 62.6 kg (138 lb)   SpO2 98%   BMI 23.69 kg/m   Wt Readings from Last 3 Encounters:  11/23/21 62.6 kg (138 lb)  07/20/21 62.7 kg (138 lb 3.2 oz)  05/11/21 62.7 kg (138 lb 2 oz)   PHYSICAL EXAM: General:  NAD. No resp difficulty HEENT: Normal Neck: Supple. No JVD. Carotids 2+ bilat; no bruits. No lymphadenopathy or thryomegaly appreciated. Cor: PMI nondisplaced. Regular rate & rhythm. No rubs, gallops or murmurs. Lungs: Clear Abdomen: Soft, nontender, nondistended. No hepatosplenomegaly. No bruits or masses. Good bowel sounds. Extremities: No cyanosis, clubbing, rash, edema Neuro: Alert & oriented x 3, cranial nerves grossly intact. Moves all 4 extremities w/o difficulty. Affect pleasant.  ASSESSMENT &  PLAN: 1 Right Breast Cancer, stage IV (widely metastatic)  - Diagnosed 5/22. ER/PR - HER-2 + - S/p induction treatment with Taxotere, Herceptin, and pertuzumab x 6 cycles starting in 6/22 - Now on H/P q 3 weeks. Tolerating well.  2. Herceptin induced cardiomyopathy - Echo 6/22 EF 60-65% GLS -17.4% - Echo 1/23 EF 55% GLS -18.2 - Echo 3/23 in clinic EF 50-55%. Suspect early Herceptin cardiotoxcity - Echo 07/20/21 EF 55-60% (EF normalized) - Echo today 11/23/21 EF 60-65%. - NYHA I, volume good. - BP high. Will restart losartan 25 mg daily. Discussed using reliable BC. - She has stopped Coreg. EF stable, OK to remain off. - EF stable back on herceptin. Can continue  Herceptin.  3. HTN - Restart losartan as above.  Follow up in 4 months with Dr. Haroldine Laws + echo. Given info for PCP, needs to establish.  Rafael Bihari, FNP  3:24 PM  Patient seen and examined with the above-signed Advanced Practice Provider and/or Housestaff. I personally reviewed laboratory data, imaging studies and relevant notes. I independently examined the patient and formulated the important aspects of the plan. I have edited the note to reflect any of my changes or salient points. I have personally discussed the plan with the patient and/or family.  Tolerating Herceptin well. Echo today reviewed personally 60-65%. BP up. No HF symptoms  General:  Well appearing. No resp difficulty HEENT: normal Neck: supple. no JVD. Carotids 2+ bilat; no bruits. No lymphadenopathy or thryomegaly appreciated. + port Cor: PMI nondisplaced. Regular rate & rhythm. No rubs, gallops or murmurs. Lungs: clear Abdomen: soft, nontender, nondistended. No hepatosplenomegaly. No bruits or masses. Good bowel sounds. Extremities: no cyanosis, clubbing, rash, edema Neuro: alert & orientedx3, cranial nerves grossly intact. moves all 4 extremities w/o difficulty. Affect pleasant  EF stable with Herceptin. No s/s of chemotoxicity. BP up. Restart losartan as above.   Glori Bickers, MD  9:59 PM

## 2021-12-11 ENCOUNTER — Other Ambulatory Visit (HOSPITAL_COMMUNITY): Payer: Self-pay

## 2021-12-11 ENCOUNTER — Other Ambulatory Visit (HOSPITAL_COMMUNITY): Payer: Medicaid Other

## 2021-12-11 DIAGNOSIS — C50919 Malignant neoplasm of unspecified site of unspecified female breast: Secondary | ICD-10-CM

## 2021-12-11 NOTE — Addendum Note (Signed)
Addended by: Shela Nevin R on: 64/29/0379 02:13 PM   Modules accepted: Orders

## 2022-01-14 ENCOUNTER — Telehealth (HOSPITAL_COMMUNITY): Payer: Self-pay | Admitting: Cardiology

## 2022-01-14 NOTE — Telephone Encounter (Signed)
Abnormal labs received via fax Cr 0.91 BUN 9 K 3.3   Per Allena Katz, NP Increase potassium in diet and start KCL 20 meq daily Repeat bmet x 2 weeks   [facility] 12/18/21 LMOM 12/21/21 LMOM 01/14/22

## 2022-07-03 ENCOUNTER — Encounter (HOSPITAL_COMMUNITY): Payer: Self-pay | Admitting: Internal Medicine

## 2022-07-03 ENCOUNTER — Ambulatory Visit (HOSPITAL_COMMUNITY)
Admission: RE | Admit: 2022-07-03 | Discharge: 2022-07-03 | Disposition: A | Discharge status: Home / Self Care | Payer: Medicaid Other | Source: Ambulatory Visit | Attending: Internal Medicine | Admitting: Internal Medicine

## 2022-07-03 ENCOUNTER — Ambulatory Visit (HOSPITAL_BASED_OUTPATIENT_CLINIC_OR_DEPARTMENT_OTHER)
Admission: RE | Admit: 2022-07-03 | Discharge: 2022-07-03 | Disposition: A | Discharge status: Home / Self Care | Payer: Medicaid Other | Source: Ambulatory Visit | Attending: Internal Medicine | Admitting: Internal Medicine

## 2022-07-03 VITALS — BP 138/88 | HR 72 | Wt 129.0 lb

## 2022-07-03 DIAGNOSIS — I427 Cardiomyopathy due to drug and external agent: Secondary | ICD-10-CM

## 2022-07-03 DIAGNOSIS — Z8249 Family history of ischemic heart disease and other diseases of the circulatory system: Secondary | ICD-10-CM | POA: Insufficient documentation

## 2022-07-03 DIAGNOSIS — C50919 Malignant neoplasm of unspecified site of unspecified female breast: Secondary | ICD-10-CM

## 2022-07-03 DIAGNOSIS — I110 Hypertensive heart disease with heart failure: Secondary | ICD-10-CM | POA: Diagnosis present

## 2022-07-03 DIAGNOSIS — F17210 Nicotine dependence, cigarettes, uncomplicated: Secondary | ICD-10-CM | POA: Insufficient documentation

## 2022-07-03 DIAGNOSIS — Q600 Renal agenesis, unilateral: Secondary | ICD-10-CM | POA: Insufficient documentation

## 2022-07-03 DIAGNOSIS — I428 Other cardiomyopathies: Secondary | ICD-10-CM | POA: Insufficient documentation

## 2022-07-03 DIAGNOSIS — Z79899 Other long term (current) drug therapy: Secondary | ICD-10-CM | POA: Insufficient documentation

## 2022-07-03 DIAGNOSIS — I509 Heart failure, unspecified: Secondary | ICD-10-CM | POA: Diagnosis not present

## 2022-07-03 DIAGNOSIS — T451X5A Adverse effect of antineoplastic and immunosuppressive drugs, initial encounter: Secondary | ICD-10-CM | POA: Diagnosis not present

## 2022-07-03 DIAGNOSIS — Z853 Personal history of malignant neoplasm of breast: Secondary | ICD-10-CM | POA: Diagnosis not present

## 2022-07-03 DIAGNOSIS — I10 Essential (primary) hypertension: Secondary | ICD-10-CM

## 2022-07-03 DIAGNOSIS — Z0189 Encounter for other specified special examinations: Secondary | ICD-10-CM | POA: Diagnosis not present

## 2022-07-03 DIAGNOSIS — C50911 Malignant neoplasm of unspecified site of right female breast: Secondary | ICD-10-CM | POA: Diagnosis not present

## 2022-07-03 DIAGNOSIS — Z1501 Genetic susceptibility to malignant neoplasm of breast: Secondary | ICD-10-CM | POA: Diagnosis not present

## 2022-07-03 LAB — ECHOCARDIOGRAM COMPLETE
AR max vel: 1.51 cm2
AV Area VTI: 1.46 cm2
AV Area mean vel: 1.47 cm2
AV Mean grad: 4.5 mmHg
AV Peak grad: 8.3 mmHg
Ao pk vel: 1.44 m/s
Area-P 1/2: 4.89 cm2
Calc EF: 60.6 %
S' Lateral: 3.3 cm
Single Plane A2C EF: 63.5 %
Single Plane A4C EF: 58.6 %

## 2022-07-03 MED ORDER — LOSARTAN POTASSIUM 25 MG PO TABS: 25.0000 mg | ORAL_TABLET | Freq: Every day | ORAL | 3 refills | Status: DC

## 2022-07-03 MED ORDER — METOPROLOL SUCCINATE ER 25 MG PO TB24: 25.0000 mg | ORAL_TABLET | Freq: Every day | ORAL | 3 refills | Status: DC

## 2022-07-03 NOTE — Progress Notes (Signed)
CARDIO-ONCOLOGY CLINIC CONSULT NOTE  Referring Physician: Dr. Drue Second Primary Care: none HF Cardiologist: Dr. Gala Romney   HPI:  [name] is a 37 y.o. female with h/o tobacco use, congenital solitary kidney and stage IV right breast cancer  ER/PR - Hercpetin +) referred by Dr. Park Breed for enrollment into the Cardio-Oncology program.  Diagnosed with R breast cancer in 5/22. Underwent 6 cycles of induction THP in 6/22 and then started on H/P q 3 weeks.   - Echo 6/22 EF 60-65% GLS -17.4% - Echo 1/23 EF 55% GLS -18.2 mild to moderate RVSP - Echo 07/20/21 55-60%   Today she returns for HF follow up. Overall feeling fine. Under a lot of stress in her life. Now moved back to [place] to live with her Mom.  Remains on Herceptin/Perjeta. No problems with SOB, orthopnea or PND.   Echo today 07/03/22 EF 55-60% GLS -17.2%   Echo  11/23/21, EF 55-60%.  Past Medical History:  Diagnosis Date   Herpes genitalia    Solitary kidney    Current Outpatient Medications  Medication Sig Dispense Refill   LORazepam (ATIVAN) 1 MG tablet Take 1 mg by mouth every 8 (eight) hours. As needed     No current facility-administered medications for this encounter.   No Known Allergies  Social History   Socioeconomic History   Marital status: Single    Spouse name: Not on file   Number of children: Not on file   Years of education: Not on file   Highest education level: Not on file  Occupational History   Not on file  Tobacco Use   Smoking status: Every Day    Packs/day: 0.50    Years: 5.00    Total pack years: 2.50    Types: Cigarettes   Smokeless tobacco: Never  Substance and Sexual Activity   Alcohol use: No    Comment: Occassional   Drug use: Yes    Types: Marijuana   Sexual activity: Yes    Birth control/protection: None  Other Topics Concern   Not on file  Social History Narrative   Not on file   Social Determinants of Health   Financial Resource Strain: Not on file  Food  Insecurity: Not on file  Transportation Needs: Not on file  Physical Activity: Not on file  Stress: Not on file  Social Connections: Not on file  Intimate Partner Violence: Not on file   Family History  Problem Relation Age of Onset   Diabetes Mother    Hypertension Mother    Diabetes Maternal Aunt    Asthma Brother    BP (!) 144/82   Pulse 83   Wt 62.6 kg (138 lb)   SpO2 98%   BMI 23.69 kg/m   Wt Readings from Last 3 Encounters:  11/23/21 62.6 kg (138 lb)  07/20/21 62.7 kg (138 lb 3.2 oz)  05/11/21 62.7 kg (138 lb 2 oz)   PHYSICAL EXAM: General:  Well appearing. No resp difficulty HEENT: normal Neck: supple. no JVD. Carotids 2+ bilat; no bruits. No lymphadenopathy or thryomegaly appreciated. Cor: PMI nondisplaced. Regular rate & rhythm. No rubs, gallops or murmurs. Lungs: clear Abdomen: soft, nontender, nondistended. No hepatosplenomegaly. No bruits or masses. Good bowel sounds. Extremities: no cyanosis, clubbing, rash, edema Neuro: alert & orientedx3, cranial nerves grossly intact. moves all 4 extremities w/o difficulty. Affect pleasant  ASSESSMENT & PLAN:  1 Right Breast Cancer, stage IV (widely metastatic)  - Diagnosed 5/22. ER/PR - HER-2 + -  S/p induction treatment with Taxotere, Herceptin, and pertuzumab x 6 cycles starting in 6/22 - Now on H/P q 3 weeks. Tolerating well. About to transfer care to [place]  2. Herceptin induced cardiomyopathy - Echo 6/22 EF 60-65% GLS -17.4% - Echo 1/23 EF 55% GLS -18.2 - Echo 3/23 in clinic EF 50-55%. Suspect early Herceptin cardiotoxcity - Echo 07/20/21 EF 55-60% (EF normalized) - Echo  11/23/21 EF 60-65%. - Echo today 07/03/22 EF 55-60% GLS -17.2%  - NYHA I, volume good. - BP high. Will restart losartan 25 mg daily.  - Start Toprol 25 - EF stable back on herceptin. Can continue Herceptin.  3. HTN - Restart losartan as above   Arvilla Meres, MD  1:51 PM

## 2022-07-03 NOTE — Patient Instructions (Signed)
START Toprol XL 25 mg daily.  RESTART Losartan 25 mg daily.  Your physician has requested that you have an echocardiogram. Echocardiography is a painless test that uses sound waves to create images of your heart. It provides your doctor with information about the size and shape of your heart and how well your heart's chambers and valves are working. This procedure takes approximately one hour. There are no restrictions for this procedure. Please do NOT wear cologne, perfume, aftershave, or lotions (deodorant is allowed). Please arrive 15 minutes prior to your appointment time.  Your physician recommends that you schedule a follow-up appointment in: 3 months with an echocardiogram   If you have any questions or concerns before your next appointment please send [place] a message through [place] or call our office at [phone].    TO LEAVE A MESSAGE FOR THE NURSE SELECT OPTION 2, PLEASE LEAVE A MESSAGE INCLUDING: YOUR NAME DATE OF BIRTH CALL BACK NUMBER REASON FOR CALL**this is important as we prioritize the call backs  YOU WILL RECEIVE A CALL BACK THE SAME DAY AS LONG AS YOU CALL BEFORE 4:00 PM  At the [facility], you and your health needs are our priority. As part of our continuing mission to provide you with exceptional heart care, we have created designated Provider Care Teams. These Care Teams include your primary Cardiologist (physician) and Advanced Practice Providers (APPs- Physician Assistants and Nurse Practitioners) who all work together to provide you with the care you need, when you need it.   You may see any of the following providers on your designated Care Team at your next follow up: Dr Arvilla Meres Dr Marca Ancona Dr. Marcos Eke, NP Robbie Lis, [place] [facility] [place], [place] Brynda Peon, NP Karle Plumber, PharmD   Please be sure to bring in all your medications bottles to every appointment.    Thank you for choosing  [facility]

## 2022-07-29 ENCOUNTER — Telehealth: Payer: Self-pay | Admitting: Hematology and Oncology

## 2022-07-29 NOTE — Telephone Encounter (Signed)
scheduled per referral, pt has been called and confirmed date and time. Pt is aware of location and to arrive early for check in   

## 2022-08-13 ENCOUNTER — Inpatient Hospital Stay: Payer: Medicaid Other

## 2022-08-13 ENCOUNTER — Inpatient Hospital Stay: Payer: Medicaid Other | Attending: Hematology and Oncology | Admitting: Hematology and Oncology

## 2022-08-13 ENCOUNTER — Telehealth: Payer: Self-pay | Admitting: *Deleted

## 2022-08-13 DIAGNOSIS — C50311 Malignant neoplasm of lower-inner quadrant of right female breast: Secondary | ICD-10-CM | POA: Insufficient documentation

## 2022-08-13 NOTE — Telephone Encounter (Signed)
Received VM from pt stating her ride did not show and she is needing to reschedule new pt appt for today.  In-basket sent to new pt scheduling team.

## 2022-08-15 ENCOUNTER — Telehealth: Payer: Self-pay | Admitting: *Deleted

## 2022-08-15 NOTE — Telephone Encounter (Signed)
Received VM from pt to reschedule missed appt from this week.  RN sent high priority message to scheduling to reach out to pt.

## 2022-08-16 ENCOUNTER — Telehealth: Payer: Self-pay | Admitting: Hematology and Oncology

## 2022-08-19 ENCOUNTER — Inpatient Hospital Stay: Payer: Medicaid Other

## 2022-08-19 ENCOUNTER — Other Ambulatory Visit: Payer: Self-pay

## 2022-08-19 ENCOUNTER — Other Ambulatory Visit: Payer: Self-pay | Admitting: *Deleted

## 2022-08-19 DIAGNOSIS — Z0000 Encounter for general adult medical examination without abnormal findings: Secondary | ICD-10-CM

## 2022-08-19 DIAGNOSIS — C50311 Malignant neoplasm of lower-inner quadrant of right female breast: Secondary | ICD-10-CM | POA: Diagnosis present

## 2022-08-19 LAB — CBC WITH DIFFERENTIAL (CANCER CENTER ONLY)
Abs Immature Granulocytes: 0 10*3/uL (ref 0.00–0.07)
Basophils Absolute: 0.1 10*3/uL (ref 0.0–0.1)
Basophils Relative: 1 %
Eosinophils Absolute: 0.1 10*3/uL (ref 0.0–0.5)
Eosinophils Relative: 1 %
HCT: 34.8 % — ABNORMAL LOW (ref 36.0–46.0)
Hemoglobin: 11.6 g/dL — ABNORMAL LOW (ref 12.0–15.0)
Immature Granulocytes: 0 %
Lymphocytes Relative: 34 %
Lymphs Abs: 1.8 10*3/uL (ref 0.7–4.0)
MCH: 32.5 pg (ref 26.0–34.0)
MCHC: 33.3 g/dL (ref 30.0–36.0)
MCV: 97.5 fL (ref 80.0–100.0)
Monocytes Absolute: 0.4 10*3/uL (ref 0.1–1.0)
Monocytes Relative: 8 %
Neutro Abs: 2.9 10*3/uL (ref 1.7–7.7)
Neutrophils Relative %: 56 %
Platelet Count: 284 10*3/uL (ref 150–400)
RBC: 3.57 MIL/uL — ABNORMAL LOW (ref 3.87–5.11)
RDW: 13.9 % (ref 11.5–15.5)
WBC Count: 5.1 10*3/uL (ref 4.0–10.5)
nRBC: 0 % (ref 0.0–0.2)

## 2022-08-19 LAB — CMP (CANCER CENTER ONLY)
ALT: 8 U/L (ref 0–44)
AST: 15 U/L (ref 15–41)
Albumin: 3.4 g/dL — ABNORMAL LOW (ref 3.5–5.0)
Alkaline Phosphatase: 44 U/L (ref 38–126)
Anion gap: 5 (ref 5–15)
BUN: 10 mg/dL (ref 6–20)
CO2: 28 mmol/L (ref 22–32)
Calcium: 8.9 mg/dL (ref 8.9–10.3)
Chloride: 105 mmol/L (ref 98–111)
Creatinine: 0.95 mg/dL (ref 0.44–1.00)
GFR, Estimated: >60 mL/min (ref >60)
Glucose, Bld: 116 mg/dL — ABNORMAL HIGH (ref 70–99)
Potassium: 3.1 mmol/L — ABNORMAL LOW (ref 3.5–5.1)
Sodium: 138 mmol/L (ref 135–145)
Total Bilirubin: 0.5 mg/dL (ref 0.3–1.2)
Total Protein: 8 g/dL (ref 6.5–8.1)

## 2022-08-21 ENCOUNTER — Inpatient Hospital Stay: Payer: Medicaid Other | Admitting: Hematology and Oncology

## 2022-08-21 DIAGNOSIS — C50919 Malignant neoplasm of unspecified site of unspecified female breast: Secondary | ICD-10-CM | POA: Insufficient documentation

## 2022-08-21 NOTE — Assessment & Plan Note (Deleted)
Patient was treated by Dr. Park Breed for stage IV HER2 positive hormone receptor negative breast cancer 06/16/2020: Diagnosed as stage IIb T2 N1 M0 ER/PR negative HER2 positive 08/09/2020: TCHP x 6 cycles followed by Herceptin Perjeta maintenance 07/26/2021: CT CAP: Progression of liver metastases (complete response) and pulmonary metastases Right breast DCIS

## 2022-10-16 ENCOUNTER — Ambulatory Visit (HOSPITAL_COMMUNITY): Payer: Medicaid Other

## 2022-10-16 ENCOUNTER — Inpatient Hospital Stay (HOSPITAL_COMMUNITY)
Admission: RE | Admit: 2022-10-16 | Discharge: 2022-10-16 | Disposition: A | Discharge status: Home / Self Care | Payer: Medicaid Other | Source: Ambulatory Visit | Attending: Internal Medicine | Admitting: Internal Medicine

## 2022-10-16 NOTE — Progress Notes (Signed)
Patient did not show for appt. Note left for templating purposes only      CARDIO-ONCOLOGY CLINIC  NOTE  Referring Physician: Dr. Drue Second Primary Care: none HF Cardiologist: Dr. Gala Romney   HPI:  [name] is a 37 y.o. female with h/o tobacco use, congenital solitary kidney and stage IV right breast cancer  ER/PR - Hercpetin +) referred by Dr. Park Breed for enrollment into the Cardio-Oncology program.  Diagnosed with R breast cancer in 5/22. Underwent 6 cycles of induction THP in 6/22 and then started on H/P q 3 weeks.   - Echo 6/22 EF 60-65% GLS -17.4% - Echo 1/23 EF 55% GLS -18.2 mild to moderate RVSP - Echo 07/20/21 55-60%   Today she returns for HF follow up. Overall feeling fine. Under a lot of stress in her life. Now moved back to [place] to live with her Mom.  Remains on Herceptin/Perjeta. No problems with SOB, orthopnea or PND.   Echo today 07/03/22 EF 55-60% GLS -17.2%   Echo  11/23/21, EF 55-60%.  Past Medical History:  Diagnosis Date   Herpes genitalia    Solitary kidney    Current Outpatient Medications  Medication Sig Dispense Refill   LORazepam (ATIVAN) 1 MG tablet Take 1 mg by mouth every 8 (eight) hours. As needed     losartan (COZAAR) 25 MG tablet Take 1 tablet (25 mg total) by mouth at bedtime. 90 tablet 3   metoprolol succinate (TOPROL XL) 25 MG 24 hr tablet Take 1 tablet (25 mg total) by mouth daily. 90 tablet 3   No current facility-administered medications for this encounter.   No Known Allergies  Social History   Socioeconomic History   Marital status: Single    Spouse name: Not on file   Number of children: Not on file   Years of education: Not on file   Highest education level: Not on file  Occupational History   Not on file  Tobacco Use   Smoking status: Every Day    Current packs/day: 0.50    Average packs/day: 0.5 packs/day for 5.0 years (2.5 ttl pk-yrs)    Types: Cigarettes   Smokeless tobacco: Never  Substance and  Sexual Activity   Alcohol use: No    Comment: Occassional   Drug use: Yes    Types: Marijuana   Sexual activity: Yes    Birth control/protection: None  Other Topics Concern   Not on file  Social History Narrative   Not on file   Social Determinants of Health   Financial Resource Strain: Not on file  Food Insecurity: Not on file  Transportation Needs: Not on file  Physical Activity: Not on file  Stress: Not on file  Social Connections: Not on file  Intimate Partner Violence: Not on file   Family History  Problem Relation Age of Onset   Diabetes Mother    Hypertension Mother    Diabetes Maternal Aunt    Asthma Brother    There were no vitals taken for this visit.  Wt Readings from Last 3 Encounters:  07/03/22 58.5 kg (129 lb)  11/23/21 62.6 kg (138 lb)  07/20/21 62.7 kg (138 lb 3.2 oz)   PHYSICAL EXAM: General:  Well appearing. No resp difficulty HEENT: normal Neck: supple. no JVD. Carotids 2+ bilat; no bruits. No lymphadenopathy or thryomegaly appreciated. Cor: PMI nondisplaced. Regular rate & rhythm. No rubs, gallops or murmurs. Lungs: clear Abdomen: soft, nontender, nondistended. No hepatosplenomegaly. No bruits or masses.  Good bowel sounds. Extremities: no cyanosis, clubbing, rash, edema Neuro: alert & orientedx3, cranial nerves grossly intact. moves all 4 extremities w/o difficulty. Affect pleasant  ASSESSMENT & PLAN:  1 Right Breast Cancer, stage IV (widely metastatic)  - Diagnosed 5/22. ER/PR - HER-2 + - S/p induction treatment with Taxotere, Herceptin, and pertuzumab x 6 cycles starting in 6/22 - Now on H/P q 3 weeks. Tolerating well. About to transfer care to [place]  2. Herceptin induced cardiomyopathy - Echo 6/22 EF 60-65% GLS -17.4% - Echo 1/23 EF 55% GLS -18.2 - Echo 3/23 in clinic EF 50-55%. Suspect early Herceptin cardiotoxcity - Echo 07/20/21 EF 55-60% (EF normalized) - Echo  11/23/21 EF 60-65%. - Echo 07/03/22 EF 55-60% GLS -17.2%  - NYHA I, volume  good. - Continue losartan 25 mg daily.  - Continue Toprol 25 - EF stable back on herceptin. Can continue Herceptin.  3. HTN - Continue current regimen   Arvilla Meres, MD  2:13 PM

## 2022-11-06 ENCOUNTER — Telehealth: Payer: Self-pay | Admitting: *Deleted

## 2022-11-06 NOTE — Telephone Encounter (Signed)
This RN spoke with pt per her call - stating " I know I have missed a couple of appts but I really do need to be seen because my cancer has returned"  " Dr Park Breed referred me to see Dr Pamelia Hoit"  Note while on the phone- could hear a man state " ma'am - she really needs to be seen- the cancer is growing out of  her skin"  Pt stated apology for interruption - with this RN validating that it is good to have someone care for her and our goal is for her to be seen ASAP.  Per above discussion- with pt and her friend who identified himself as Rolley Sims - and his contact at [phone] as pt does not have phone service - with pt in agreement to above.  This RN reviewed schedule - noted opening on MD book for this Friday- verified with MD availability.  This RN called Hassen per above- information given including location with arrival time for 1245pm on 11/08/2022.  Note Hassen stated great concern for pt- including that his grandmother passed away from breast cancer - as well as he became tearful stating " no one should just let themself get pushed aside and not get the treatment they need "  This RN validated his concern and care as well as goal of this office is to provide care to the patient.  Hassen stated appreciation.   This RN scheduled appt so time would be blocked.

## 2022-11-08 ENCOUNTER — Other Ambulatory Visit: Payer: Self-pay | Admitting: Hematology and Oncology

## 2022-11-08 ENCOUNTER — Inpatient Hospital Stay: Payer: Medicaid Other | Attending: Hematology and Oncology | Admitting: Hematology and Oncology

## 2022-11-08 DIAGNOSIS — C50911 Malignant neoplasm of unspecified site of right female breast: Secondary | ICD-10-CM

## 2022-11-08 NOTE — Assessment & Plan Note (Signed)
May 2022: Metastatic breast cancer with liver and lung metastases diagnosed May 2022 ER/PR negative HER2 positive June 2022: Palliative Taxotere Herceptin and Perjeta x 6 cycles, Herceptin Perjeta maintenance 06/19/2022: PET/CT: Resolution of hepatic and nodal hypermetabolic activity significant reduction in size and activity of the mass currently 1.1 cm, right medial breast mass reduced  Treatment plan: Restaging scans Consideration for mastectomy Continued Herceptin Perjeta maintenance

## 2022-11-22 ENCOUNTER — Other Ambulatory Visit: Payer: Self-pay | Admitting: *Deleted

## 2022-11-22 DIAGNOSIS — C50911 Malignant neoplasm of unspecified site of right female breast: Secondary | ICD-10-CM

## 2022-11-22 NOTE — Progress Notes (Signed)
Received message from PA team stating pt PET is denied.  Verbal orders received from and placed.  Pt educated and verbalized understanding.

## 2022-11-25 ENCOUNTER — Other Ambulatory Visit: Payer: Self-pay | Admitting: Hematology and Oncology

## 2022-11-25 ENCOUNTER — Encounter (HOSPITAL_COMMUNITY): Payer: Medicaid Other

## 2022-11-25 DIAGNOSIS — C50911 Malignant neoplasm of unspecified site of right female breast: Secondary | ICD-10-CM

## 2022-11-27 ENCOUNTER — Ambulatory Visit (HOSPITAL_COMMUNITY)
Admission: RE | Admit: 2022-11-27 | Discharge: 2022-11-27 | Disposition: A | Discharge status: Home / Self Care | Payer: Medicaid Other | Source: Ambulatory Visit | Attending: Hematology and Oncology | Admitting: Hematology and Oncology

## 2022-11-27 ENCOUNTER — Encounter (HOSPITAL_COMMUNITY)
Admission: RE | Admit: 2022-11-27 | Discharge: 2022-11-27 | Disposition: A | Discharge status: Home / Self Care | Payer: Medicaid Other | Source: Ambulatory Visit | Attending: Hematology and Oncology | Admitting: Hematology and Oncology

## 2022-11-27 DIAGNOSIS — C50911 Malignant neoplasm of unspecified site of right female breast: Secondary | ICD-10-CM | POA: Insufficient documentation

## 2022-11-27 MED ORDER — IOHEXOL 300 MG/ML  SOLN
100.0000 mL | Freq: Once | INTRAMUSCULAR | Status: AC | PRN
  Administered 2022-11-27: 100 mL via INTRAVENOUS

## 2022-11-27 MED ORDER — TECHNETIUM TC 99M MEDRONATE IV KIT
20.0000 | PACK | Freq: Once | INTRAVENOUS | Status: AC | PRN
  Administered 2022-11-27: 20 via INTRAVENOUS

## 2022-11-27 MED ORDER — HEPARIN SOD (PORK) LOCK FLUSH 100 UNIT/ML IV SOLN
500.0000 [IU] | Freq: Once | INTRAVENOUS | Status: AC
  Administered 2022-11-27: 500 [IU] via INTRAVENOUS

## 2022-11-27 MED ORDER — HEPARIN SOD (PORK) LOCK FLUSH 100 UNIT/ML IV SOLN
INTRAVENOUS | Status: AC
  Filled 2022-11-27: qty 5

## 2022-12-02 ENCOUNTER — Inpatient Hospital Stay: Payer: Medicaid Other | Attending: Hematology and Oncology | Admitting: Hematology and Oncology

## 2022-12-02 ENCOUNTER — Telehealth: Payer: Self-pay | Admitting: *Deleted

## 2022-12-02 NOTE — Telephone Encounter (Signed)
RN attempt x1 to contact pt regarding missed app today.  No answer, LVM for pt to return call to the office.  RN also attempt x1 to contact pt mother, no answer, LVM as well.

## 2022-12-02 NOTE — Assessment & Plan Note (Deleted)
06/16/2020 stage IV: T2 N1 M1 ER/PR negative HER2 positive metastatic breast cancer (diagnosed and treated by Dr. Park Breed) 08/09/2020 -11/02/2020 6 cycles of THP, on Herceptin and Perjeta maintenance Right breast DCIS Current treatment: Herceptin Perjeta maintenance since 12/14/2020  CT CAP and bone scan 11/27/2022:

## 2022-12-30 ENCOUNTER — Inpatient Hospital Stay: Payer: Medicaid Other | Attending: Hematology and Oncology | Admitting: Hematology and Oncology

## 2022-12-30 ENCOUNTER — Encounter: Payer: Self-pay | Admitting: Hematology and Oncology

## 2022-12-30 VITALS — BP 112/81 | HR 71 | Temp 97.3°F | Resp 18 | Ht 64.0 in | Wt 127.5 lb

## 2022-12-30 DIAGNOSIS — Z171 Estrogen receptor negative status [ER-]: Secondary | ICD-10-CM | POA: Insufficient documentation

## 2022-12-30 DIAGNOSIS — F32A Depression, unspecified: Secondary | ICD-10-CM | POA: Insufficient documentation

## 2022-12-30 DIAGNOSIS — F17210 Nicotine dependence, cigarettes, uncomplicated: Secondary | ICD-10-CM | POA: Diagnosis not present

## 2022-12-30 DIAGNOSIS — C50311 Malignant neoplasm of lower-inner quadrant of right female breast: Secondary | ICD-10-CM | POA: Insufficient documentation

## 2022-12-30 DIAGNOSIS — Z79899 Other long term (current) drug therapy: Secondary | ICD-10-CM | POA: Insufficient documentation

## 2022-12-30 DIAGNOSIS — C50911 Malignant neoplasm of unspecified site of right female breast: Secondary | ICD-10-CM

## 2022-12-30 MED ORDER — LIDOCAINE-PRILOCAINE 2.5-2.5 % EX CREA
TOPICAL_CREAM | CUTANEOUS | 3 refills | Status: DC
Start: 2022-12-30 — End: 2023-04-16

## 2022-12-30 NOTE — Progress Notes (Signed)
START OFF PATHWAY REGIMEN - Breast   Custom Intervention:Medical: [Trastuzumab and hyaluronidase-oysk, Pertuzumab]:     Trastuzumab and hyaluronidase-oysk      Pertuzumab   **Always confirm dose/schedule in your pharmacy ordering system**  Patient Characteristics: Distant Metastases or Locoregional Recurrent Disease - Unresected, M0 or Locally Advanced Unresectable Disease Progressing after Neoadjuvant and Local Therapies, M0, HER2 Positive, ER Negative, Chemotherapy, First Line Therapeutic Status: Distant Metastases HER2 Status: Positive (+) ER Status: Negative (-) PR Status: Negative (-) Line of Therapy: First Line Intent of Therapy: Non-Curative / Palliative Intent, Discussed with Patient

## 2022-12-30 NOTE — Progress Notes (Signed)
[facility] CONSULT NOTE  Patient Care Team: Default, Provider, MD as PCP - General  CHIEF COMPLAINTS/PURPOSE OF CONSULTATION:  Follow-up of metastatic breast cancer  HISTORY OF PRESENTING ILLNESS:    History of Present Illness   The patient, previously diagnosed with metastatic breast cancer under the care of Dr.Khan, has been off treatment for approximately a year was referred to [place] to assume her care. She missed several appointments previously. She reports feeling depressed and overwhelmed with the number of appointments and the information about her condition. She has been experiencing some physical changes, including the appearance of a lesion on her lip, which she finds embarrassing. She also reports feeling a lump under her arm, which causes discomfort. The patient expresses anxiety about her health, often worrying about her breathing and the state of her internal organs. She expresses a desire to restart treatment and to get back on track with managing her condition.     I reviewed her records extensively and collaborated the history with the patient.     MEDICAL HISTORY:  Past Medical History:  Diagnosis Date   Herpes genitalia    Solitary kidney     SURGICAL HISTORY: Past Surgical History:  Procedure Laterality Date   DILATION AND EVACUATION     ECTOPIC PREGNANCY SURGERY      SOCIAL HISTORY: Social History   Socioeconomic History   Marital status: Single    Spouse name: Not on file   Number of children: Not on file   Years of education: Not on file   Highest education level: Not on file  Occupational History   Not on file  Tobacco Use   Smoking status: Every Day    Current packs/day: 0.50    Average packs/day: 0.5 packs/day for 5.0 years (2.5 ttl pk-yrs)    Types: Cigarettes   Smokeless tobacco: Never  Substance and Sexual Activity   Alcohol use: No    Comment: Occassional   Drug use: Yes    Types: Marijuana   Sexual activity: Yes     Birth control/protection: None  Other Topics Concern   Not on file  Social History Narrative   Not on file   Social Determinants of Health   Financial Resource Strain: Not on file  Food Insecurity: Not on file  Transportation Needs: Not on file  Physical Activity: Not on file  Stress: Not on file  Social Connections: Not on file  Intimate Partner Violence: Not on file    FAMILY HISTORY: Family History  Problem Relation Age of Onset   Diabetes Mother    Hypertension Mother    Diabetes Maternal Aunt    Asthma Brother     ALLERGIES:  has No Known Allergies.  MEDICATIONS:  Current Outpatient Medications  Medication Sig Dispense Refill   LORazepam (ATIVAN) 1 MG tablet Take 1 mg by mouth every 8 (eight) hours. As needed     losartan (COZAAR) 25 MG tablet Take 1 tablet (25 mg total) by mouth at bedtime. 90 tablet 3   metoprolol succinate (TOPROL XL) 25 MG 24 hr tablet Take 1 tablet (25 mg total) by mouth daily. 90 tablet 3   No current facility-administered medications for this visit.    REVIEW OF SYSTEMS:   Constitutional: Denies fevers, chills or abnormal night sweats Extremely tearful and depressed All other systems were reviewed with the patient and are negative.  PHYSICAL EXAMINATION: ECOG PERFORMANCE STATUS: 1 - Symptomatic but completely ambulatory  Vitals:   12/30/22 1126  BP: 112/81  Pulse: 71  Resp: 18  Temp: (!) 97.3 F (36.3 C)  SpO2: 100%   Filed Weights   12/30/22 1126  Weight: 127 lb 8 oz (57.8 kg)      LABORATORY DATA:  I have reviewed the data as listed Lab Results  Component Value Date   WBC 5.1 08/19/2022   HGB 11.6 (L) 08/19/2022   HCT 34.8 (L) 08/19/2022   MCV 97.5 08/19/2022   PLT 284 08/19/2022   Lab Results  Component Value Date   NA 138 08/19/2022   K 3.1 (L) 08/19/2022   CL 105 08/19/2022   CO2 28 08/19/2022    RADIOGRAPHIC STUDIES: I have personally reviewed the radiological reports and agreed with the findings in the  report.  ASSESSMENT AND PLAN:  Breast cancer metastasized to multiple sites (HCC) 06/16/2020 stage IV: T2 N1 M1 ER/PR negative HER2 positive metastatic breast cancer (diagnosed and treated by Dr. Welton Flakes) 08/09/2020 -11/02/2020 6 cycles of THP, on Herceptin and Perjeta maintenance Right breast DCIS Current treatment: Herceptin Perjeta maintenance since 12/14/2020 (discontinued November 2023 after she left Dr. Milta Deiters clinic)  CT CAP and bone scan 12/02/2022: 2 solid right breast masses both increase in size since 04/26/2022, newly enlarged right axillary lymph nodes suspicious for recurrent nodal mets, small 1.1 cm nodule mediastinum, small hypodense liver lesions are stable.  Bone scan: No bone metastasis  Treatment plan: Resume Herceptin and Perjeta Assessment and Plan    Metastatic Breast Cancer Patient has been off treatment for approximately a year due to depression and logistical issues. Recent scans show two lumps in the right breast, new lymph nodes under the arm, and a small lymph node in the middle of the chest. Liver spots are stable and not active. -Plan to restart Herceptin and Perjeta for systemic control of disease. -Order echocardiogram to assess cardiac function prior to restarting treatment. -Consider surgical consultation for potential future removal of breast tumor if systemic disease is controlled. -Refer to counselor for emotional support and management of depression.  Oral Lesion Patient reports a new oral lesion that is causing distress due to its appearance. -Refer to a surgeon for evaluation and potential removal of the lesion.      Patient will need an echocardiogram and start treatment with Herceptin and Perjeta   All questions were answered. The patient knows to call the clinic with any problems, questions or concerns.    Tamsen Meek, MD 12/30/22

## 2022-12-30 NOTE — Assessment & Plan Note (Signed)
06/16/2020 stage IV: T2 N1 M1 ER/PR negative HER2 positive metastatic breast cancer (diagnosed and treated by Dr. Welton Flakes) 08/09/2020 -11/02/2020 6 cycles of THP, on Herceptin and Perjeta maintenance Right breast DCIS Current treatment: Herceptin Perjeta maintenance since 12/14/2020  CT CAP and bone scan 12/02/2022: 2 solid right breast masses both increase in size since 04/26/2022, newly enlarged right axillary lymph nodes suspicious for recurrent nodal mets, small 1.1 cm nodule mediastinum, small hypodense liver lesions are stable.  Bone scan: No bone metastasis  Treatment plan: Switch treatment to [place]

## 2022-12-31 ENCOUNTER — Other Ambulatory Visit: Payer: Self-pay

## 2023-01-01 ENCOUNTER — Encounter: Payer: Self-pay | Admitting: General Practice

## 2023-01-01 NOTE — Progress Notes (Signed)
[facility] Spiritual Care Note  Referred by Dr Pamelia Hoit for additional layer of emotional support and potential resource referrals, including counseling. Left voicemail of introduction with direct number, encouraging return call. Will try again as needed, as well.   [address] [place], [place], [facility] Pager [phone] Voicemail [phone]

## 2023-01-02 ENCOUNTER — Other Ambulatory Visit: Payer: Self-pay

## 2023-01-02 ENCOUNTER — Encounter: Payer: Self-pay | Admitting: General Practice

## 2023-01-02 NOTE — Progress Notes (Signed)
[facility] Spiritual Care Note  Left second voicemail from Patient and Family Support, encouraging return call.   [address] Rush [place], [place], [facility] Pager [phone] Voicemail [phone]

## 2023-01-06 ENCOUNTER — Encounter: Payer: Self-pay | Admitting: General Practice

## 2023-01-06 NOTE — Progress Notes (Signed)
[facility] Spiritual Care Note  Left voicemail introducing Patient and Family Support/Spiritual Care and encouraging return call.   [address] Rush [place], [place], [facility] Pager [phone] Voicemail [phone]

## 2023-01-08 ENCOUNTER — Encounter: Payer: Self-pay | Admitting: Hematology and Oncology

## 2023-01-08 ENCOUNTER — Telehealth: Payer: Self-pay | Admitting: Hematology and Oncology

## 2023-01-08 NOTE — Telephone Encounter (Signed)
Called patient on both 01/08/2023 and on 01/07/2023 regarding scheduled appointment times/dates for treatment dates, left a voicemail with callback number if dates were needing to be rescheduled

## 2023-01-09 ENCOUNTER — Other Ambulatory Visit: Payer: Self-pay

## 2023-01-09 ENCOUNTER — Inpatient Hospital Stay: Payer: Medicaid Other

## 2023-01-09 ENCOUNTER — Inpatient Hospital Stay: Payer: Medicaid Other | Admitting: Pharmacist

## 2023-01-13 ENCOUNTER — Telehealth: Payer: Self-pay

## 2023-01-13 ENCOUNTER — Inpatient Hospital Stay: Payer: Medicaid Other

## 2023-01-13 ENCOUNTER — Inpatient Hospital Stay: Payer: Medicaid Other | Admitting: Pharmacist

## 2023-01-13 ENCOUNTER — Inpatient Hospital Stay: Payer: Medicaid Other | Admitting: Hematology and Oncology

## 2023-01-13 NOTE — Telephone Encounter (Signed)
Called pt this morning to discuss appts. She no showed to her appts today. Attempted pt X2 and tried calling pt's mother (emergency contact) as well as friend Asim, who was listed as emergency contact but has since been removed per pt's request.  S/w Asim who states he will talk to the pt. There was an extensive conversation about pt needing to stay in compliance with appts and he was tearful, concerned for pt's well-being and states he will try to get in touch with her.  Once again contacted pt who requested we delete Asim from her emergency contacts and requested to add her best friend, Grover Canavan.  Spoke with pt about importance of compliance with appts. She verbalized she has transportation concerns. Advised pt she has resources since she has medicaid. They offer transportation services. She states she will look into this. She is expecting a call from our scheduler.

## 2023-01-13 NOTE — Assessment & Plan Note (Deleted)
06/16/2020 stage IV: T2 N1 M1 ER/PR negative HER2 positive metastatic breast cancer (diagnosed and treated by Dr. Welton Flakes) 08/09/2020 -11/02/2020 6 cycles of THP, on Herceptin and Perjeta maintenance Right breast DCIS Current treatment: Herceptin Perjeta maintenance since 12/14/2020 (discontinued November 2023 after she left Dr. Milta Deiters clinic)   CT CAP and bone scan 12/02/2022: 2 solid right breast masses both increase in size since 04/26/2022, newly enlarged right axillary lymph nodes suspicious for recurrent nodal mets, small 1.1 cm nodule mediastinum, small hypodense liver lesions are stable.  Bone scan: No bone metastasis   Treatment plan: Resume Herceptin and Perjeta Social and emotional support: Chaplain has been in contact with her  Return to clinic every 3 weeks for Herceptin and Perjeta and every 6 weeks of follow-up with me.

## 2023-01-14 ENCOUNTER — Telehealth: Payer: Self-pay

## 2023-01-14 ENCOUNTER — Telehealth: Payer: Self-pay | Admitting: Hematology and Oncology

## 2023-01-14 NOTE — Telephone Encounter (Signed)
Left patient a message in regards to scheduled appointment times/dates for follow up appointment

## 2023-01-14 NOTE — Telephone Encounter (Signed)
Our scheduler was unsuccessful reaching pt today to advise appts. Attempted to call pt and LVM letting her know we were able to r/s her appts for 11/26 and asked if she was able to get transportation assistance via Medicaid.

## 2023-01-20 ENCOUNTER — Other Ambulatory Visit: Payer: Self-pay

## 2023-01-20 ENCOUNTER — Encounter (HOSPITAL_COMMUNITY): Payer: Self-pay | Admitting: Internal Medicine

## 2023-01-20 ENCOUNTER — Ambulatory Visit (HOSPITAL_COMMUNITY)
Admission: RE | Admit: 2023-01-20 | Discharge: 2023-01-20 | Disposition: A | Discharge status: Home / Self Care | Payer: Medicaid Other | Source: Ambulatory Visit | Attending: Internal Medicine | Admitting: Internal Medicine

## 2023-01-20 ENCOUNTER — Encounter (HOSPITAL_COMMUNITY): Payer: Self-pay | Admitting: *Deleted

## 2023-01-20 DIAGNOSIS — I509 Heart failure, unspecified: Secondary | ICD-10-CM | POA: Insufficient documentation

## 2023-01-20 DIAGNOSIS — T451X5A Adverse effect of antineoplastic and immunosuppressive drugs, initial encounter: Secondary | ICD-10-CM | POA: Insufficient documentation

## 2023-01-20 DIAGNOSIS — I427 Cardiomyopathy due to drug and external agent: Secondary | ICD-10-CM | POA: Insufficient documentation

## 2023-01-20 NOTE — Progress Notes (Signed)
Dr Gala Romney attempted to call pt's several times for phone visit to discuss echo and Left message to call back, mychart message also sent.

## 2023-01-20 NOTE — Progress Notes (Signed)
Pt had echo done today and was checked in for visit with Dr Gala Romney, however pt states she had tooth pulled and is in a lot of pain, she states she can not wait any longer to be seen and needs to leave, she request virtual visit be completed later today. Dr Gala Romney aware

## 2023-01-21 ENCOUNTER — Inpatient Hospital Stay: Payer: Medicaid Other | Admitting: Pharmacist

## 2023-01-21 ENCOUNTER — Inpatient Hospital Stay: Payer: Medicaid Other

## 2023-01-21 ENCOUNTER — Inpatient Hospital Stay (HOSPITAL_BASED_OUTPATIENT_CLINIC_OR_DEPARTMENT_OTHER): Payer: Medicaid Other | Admitting: Hematology and Oncology

## 2023-01-21 ENCOUNTER — Inpatient Hospital Stay: Payer: Medicaid Other | Admitting: Licensed Clinical Social Worker

## 2023-01-21 ENCOUNTER — Encounter: Payer: Self-pay | Admitting: General Practice

## 2023-01-21 VITALS — BP 104/74 | HR 79 | Temp 98.0°F | Resp 18 | Ht 64.0 in | Wt 130.2 lb

## 2023-01-21 VITALS — BP 136/90 | HR 67

## 2023-01-21 DIAGNOSIS — C50311 Malignant neoplasm of lower-inner quadrant of right female breast: Secondary | ICD-10-CM | POA: Diagnosis not present

## 2023-01-21 DIAGNOSIS — C50911 Malignant neoplasm of unspecified site of right female breast: Secondary | ICD-10-CM

## 2023-01-21 LAB — CBC WITH DIFFERENTIAL (CANCER CENTER ONLY)
Abs Immature Granulocytes: 0.01 10*3/uL (ref 0.00–0.07)
Basophils Absolute: 0.1 10*3/uL (ref 0.0–0.1)
Basophils Relative: 1 %
Eosinophils Absolute: 0.2 10*3/uL (ref 0.0–0.5)
Eosinophils Relative: 3 %
HCT: 33.6 % — ABNORMAL LOW (ref 36.0–46.0)
Hemoglobin: 11.1 g/dL — ABNORMAL LOW (ref 12.0–15.0)
Immature Granulocytes: 0 %
Lymphocytes Relative: 30 %
Lymphs Abs: 1.7 10*3/uL (ref 0.7–4.0)
MCH: 32.6 pg (ref 26.0–34.0)
MCHC: 33 g/dL (ref 30.0–36.0)
MCV: 98.5 fL (ref 80.0–100.0)
Monocytes Absolute: 0.5 10*3/uL (ref 0.1–1.0)
Monocytes Relative: 9 %
Neutro Abs: 3.3 10*3/uL (ref 1.7–7.7)
Neutrophils Relative %: 57 %
Platelet Count: 322 10*3/uL (ref 150–400)
RBC: 3.41 MIL/uL — ABNORMAL LOW (ref 3.87–5.11)
RDW: 14.2 % (ref 11.5–15.5)
WBC Count: 5.7 10*3/uL (ref 4.0–10.5)
nRBC: 0 % (ref 0.0–0.2)

## 2023-01-21 LAB — CMP (CANCER CENTER ONLY)
ALT: 6 U/L (ref 0–44)
AST: 11 U/L — ABNORMAL LOW (ref 15–41)
Albumin: 3.3 g/dL — ABNORMAL LOW (ref 3.5–5.0)
Alkaline Phosphatase: 42 U/L (ref 38–126)
Anion gap: 3 — ABNORMAL LOW (ref 5–15)
BUN: 14 mg/dL (ref 6–20)
CO2: 27 mmol/L (ref 22–32)
Calcium: 8.6 mg/dL — ABNORMAL LOW (ref 8.9–10.3)
Chloride: 109 mmol/L (ref 98–111)
Creatinine: 0.88 mg/dL (ref 0.44–1.00)
GFR, Estimated: >60 mL/min (ref >60)
Glucose, Bld: 103 mg/dL — ABNORMAL HIGH (ref 70–99)
Potassium: 3.7 mmol/L (ref 3.5–5.1)
Sodium: 139 mmol/L (ref 135–145)
Total Bilirubin: 0.2 mg/dL (ref <1.2)
Total Protein: 8.2 g/dL — ABNORMAL HIGH (ref 6.5–8.1)

## 2023-01-21 LAB — PREGNANCY, URINE: Preg Test, Ur: NEGATIVE

## 2023-01-21 MED ORDER — TRASTUZUMAB-ANNS CHEMO 150 MG IV SOLR
8.0000 mg/kg | Freq: Once | INTRAVENOUS | Status: AC
  Administered 2023-01-21: 462 mg via INTRAVENOUS
  Filled 2023-01-21: qty 22

## 2023-01-21 MED ORDER — DIPHENHYDRAMINE HCL 25 MG PO CAPS
50.0000 mg | ORAL_CAPSULE | Freq: Once | ORAL | Status: AC
  Administered 2023-01-21: 50 mg via ORAL
  Filled 2023-01-21: qty 2

## 2023-01-21 MED ORDER — HEPARIN SOD (PORK) LOCK FLUSH 100 UNIT/ML IV SOLN
500.0000 [IU] | Freq: Once | INTRAVENOUS | Status: AC | PRN
Start: 2023-01-21 — End: 2023-01-21
  Administered 2023-01-21: 500 [IU]

## 2023-01-21 MED ORDER — ACETAMINOPHEN 325 MG PO TABS
650.0000 mg | ORAL_TABLET | Freq: Once | ORAL | Status: AC
Start: 2023-01-21 — End: 2023-01-21
  Administered 2023-01-21: 650 mg via ORAL
  Filled 2023-01-21: qty 2

## 2023-01-21 MED ORDER — SODIUM CHLORIDE 0.9 % IV SOLN: INTRAVENOUS | Status: DC

## 2023-01-21 MED ORDER — SODIUM CHLORIDE 0.9 % IV SOLN
840.0000 mg | Freq: Once | INTRAVENOUS | Status: AC
  Administered 2023-01-21: 840 mg via INTRAVENOUS
  Filled 2023-01-21: qty 28

## 2023-01-21 MED ORDER — SODIUM CHLORIDE 0.9% FLUSH
10.0000 mL | INTRAVENOUS | Status: DC | PRN
  Administered 2023-01-21: 10 mL

## 2023-01-21 NOTE — Progress Notes (Signed)
Patient Care Team: Default, Provider, MD as PCP - General  DIAGNOSIS:  Encounter Diagnosis  Name Primary?   Carcinoma of right breast metastatic to multiple sites [facility]) Yes    SUMMARY OF ONCOLOGIC HISTORY: Oncology History  Breast cancer metastasized to multiple sites (HCC)  06/16/2020 Initial Diagnosis   [place]:  stage IV: T2 N1 M1 ER/PR negative HER2 positive metastatic breast cancer (diagnosed and treated by Dr. Welton Flakes) 08/09/2020 -11/02/2020 6 cycles of THP, on Herceptin and Perjeta maintenance Right breast DCIS   01/13/2023 -  Chemotherapy   Patient is on Treatment Plan : BREAST Trastuzumab + Pertuzumab q21d       CHIEF COMPLIANT: Herceptin and Perjeta maintenance  HISTORY OF PRESENT ILLNESS:  History of Present Illness   The patient, with a history of breast cancer, presents for a follow-up visit after a hiatus of about eight to nine months from her treatment. She has been on Herceptin and Perjeta for a few years prior to the break. She reports noticing an increase in the size of her tumors, which has caused her some concern. She also mentions some discomfort due to the growth of the tumors.  In addition to her physical health, the patient also discusses her emotional well-being. She indicates that her emotional health is improving and she is ready for change. She expresses a desire for constructive feedback and guidance, rather than just a listening ear.   ALLERGIES:  has No Known Allergies.  MEDICATIONS:  Current Outpatient Medications  Medication Sig Dispense Refill   lidocaine-prilocaine (EMLA) cream Apply to affected area once 30 g 3   LORazepam (ATIVAN) 1 MG tablet Take 1 mg by mouth every 8 (eight) hours. As needed     losartan (COZAAR) 25 MG tablet Take 1 tablet (25 mg total) by mouth at bedtime. 90 tablet 3   metoprolol succinate (TOPROL XL) 25 MG 24 hr tablet Take 1 tablet (25 mg total) by mouth daily. 90 tablet 3   No current facility-administered  medications for this visit.    PHYSICAL EXAMINATION: ECOG PERFORMANCE STATUS: 1 - Symptomatic but completely ambulatory  Vitals:   01/21/23 0857  BP: 104/74  Pulse: 79  Resp: 18  Temp: 98 F (36.7 C)  SpO2: 100%   Filed Weights   01/21/23 0857  Weight: 130 lb 3.2 oz (59.1 kg)    Physical Exam   BREAST: Two separate masses palpable, one causing pain.      (exam performed in the presence of a chaperone)  LABORATORY DATA:  I have reviewed the data as listed    Latest Ref Rng & Units 08/19/2022    1:44 PM 04/21/2015    4:22 AM 04/20/2015    3:03 PM  CMP  Glucose 70 - 99 mg/dL 960  454  098   BUN 6 - 20 mg/dL 10  9  8    Creatinine 0.44 - 1.00 mg/dL 1.19  1.47  8.29   Sodium 135 - 145 mmol/L 138  137  140   Potassium 3.5 - 5.1 mmol/L 3.1  4.4  4.3   Chloride 98 - 111 mmol/L 105  106  100   CO2 22 - 32 mmol/L 28  22    Calcium 8.9 - 10.3 mg/dL 8.9  8.7    Total Protein 6.5 - 8.1 g/dL 8.0  7.2    Total Bilirubin 0.3 - 1.2 mg/dL 0.5  0.3    Alkaline Phos 38 - 126 U/L 44  46  AST 15 - 41 U/L 15  14    ALT 0 - 44 U/L 8  11      Lab Results  Component Value Date   WBC 5.7 01/21/2023   HGB 11.1 (L) 01/21/2023   HCT 33.6 (L) 01/21/2023   MCV 98.5 01/21/2023   PLT 322 01/21/2023   NEUTROABS 3.3 01/21/2023    ASSESSMENT & PLAN:  Breast cancer metastasized to multiple sites (HCC) 06/16/2020 stage IV: T2 N1 M1 ER/PR negative HER2 positive metastatic breast cancer (diagnosed and treated by Dr. Welton Flakes) 08/09/2020 -11/02/2020 6 cycles of THP, on Herceptin and Perjeta maintenance Right breast DCIS Current treatment: Herceptin Perjeta maintenance since 12/14/2020 (discontinued November 2023 after she left Dr. Milta Deiters clinic)   CT CAP and bone scan 12/02/2022: 2 solid right breast masses both increase in size since 04/26/2022, newly enlarged right axillary lymph nodes suspicious for recurrent nodal mets, small 1.1 cm nodule mediastinum, small hypodense liver lesions are stable.  Bone  scan: No bone metastasis   Treatment plan: Resume Herceptin and Perjeta Echocardiogram completed 01/20/2023: Echo by Dr. Gala Romney 01/20/23 EF 60-65%, GLS -16.9%    Refer patient to counselors for emotional support and management of depression. Return to clinic every 3 weeks for Herceptin Perjeta and every 6 weeks for follow-up with me.   -If insufficient response, consider stronger treatment options.  Emotional Health Patient reports improvement but does not currently have a therapist. Expressed interest in receiving constructive feedback and guidance. -Offered chaplain services and encouraged consideration of therapy. -Continue to monitor emotional health during treatment.   No orders of the defined types were placed in this encounter.  The patient has a good understanding of the overall plan. she agrees with it. she will call with any problems that may develop before the next visit here. Total time spent: 30 mins including face to face time and time spent for planning, charting and co-ordination of care   Tamsen Meek, MD 01/21/23

## 2023-01-21 NOTE — Progress Notes (Signed)
Patient remained for 45 min observation post pertuzumab infusion.  Patient reports no issues in the past after receiving pertuzumab.  VSS at discharge.  Tolerated treatment well without incident.  Ambulated to lobby.

## 2023-01-21 NOTE — Progress Notes (Signed)
[facility] Spiritual Care Note  Met Connie Wells in infusion to introduce Spiritual Care as part of her support team. Brought handmade blessing blanket as a tangible sign of support and comfort, as well as a goodie bag and programming information. Connie Wells notes that she is "still not accepting this [diagnosis/treatment situation]" and prefers not to talk about it, while also engaging in conversation about other topics.   Because she is between phones and relying on her computer for messages, we plan to follow up in person for rapport-building and connection.   [address] [place], [place], [facility] Pager [phone] Voicemail [phone]

## 2023-01-21 NOTE — Progress Notes (Signed)
[facility]       Telephone: [phone]?Fax: [phone]   Oncology Clinical Pharmacist Practitioner Initial Assessment  Connie Wells is a 37 y.o. female with a diagnosis of breast cancer. They were contacted today via in-person visit.  Indication/Regimen Trastuzumab (Herceptin) and pertuzumab (Perjeta) is being used appropriately for treatment of metastatic breast cancer by Dr. Serena Croissant.      Wt Readings from Last 1 Encounters:  01/21/23 130 lb 3.2 oz (59.1 kg)    Estimated body surface area is 1.63 meters squared as calculated from the following:   Height as of an earlier encounter on 01/21/23: 5\' 4"  (1.626 m).   Weight as of an earlier encounter on 01/21/23: 130 lb 3.2 oz (59.1 kg).  The dosing regimen is every 21 days for 19 cycles currently but may be longer depending on response  Trastuzumab (8 mg/kg loading dose, 6 mg/kg maintenance dose) IV on Day 1 Pertuzumab (840 mg loading dose, 420 mg maintenance dose) IV on Day 1  Dose Modifications None   Allergies No Known Allergies  Vitals    01/21/2023    8:57 AM 01/20/2023   11:10 AM 12/30/2022   11:26 AM  Oncology Vitals  Height 163 cm  163 cm  Weight 59.058 kg 58.333 kg 57.834 kg  Weight (lbs) 130 lbs 3 oz 128 lbs 10 oz 127 lbs 8 oz  BMI 22.35 kg/m2 22.07 kg/m2 21.89 kg/m2  Temp 98 F (36.7 C)  97.3 F (36.3 C)  Pulse Rate 79 89 71  BP 104/74 130/88 112/81  Resp 18  18  SpO2 100 % 98 % 100 %  BSA (m2) 1.63 m2 1.62 m2 1.62 m2     Laboratory Data    Latest Ref Rng & Units 01/21/2023    8:38 AM 08/19/2022    1:44 PM 04/23/2015    3:41 AM  CBC EXTENDED  WBC 4.0 - 10.5 K/uL 5.7  5.1  10.9   RBC 3.87 - 5.11 MIL/uL 3.41  3.57  3.43   Hemoglobin 12.0 - 15.0 g/dL 29.5  62.1  30.8   HCT 36.0 - 46.0 % 33.6  34.8  32.1   Platelets 150 - 400 K/uL 322  284  349   NEUT# 1.7 - 7.7 K/uL 3.3  2.9    Lymph# 0.7 - 4.0 K/uL 1.7  1.8         Latest Ref Rng & Units 01/21/2023    8:38  AM 08/19/2022    1:44 PM 04/21/2015    4:22 AM  CMP  Glucose 70 - 99 mg/dL 657  846  962   BUN 6 - 20 mg/dL 14  10  9    Creatinine 0.44 - 1.00 mg/dL 9.52  8.41  3.24   Sodium 135 - 145 mmol/L 139  138  137   Potassium 3.5 - 5.1 mmol/L 3.7  3.1  4.4   Chloride 98 - 111 mmol/L 109  105  106   CO2 22 - 32 mmol/L 27  28  22    Calcium 8.9 - 10.3 mg/dL 8.6  8.9  8.7   Total Protein 6.5 - 8.1 g/dL 8.2  8.0  7.2   Total Bilirubin <1.2 mg/dL 0.2  0.5  0.3   Alkaline Phos 38 - 126 U/L 42  44  46   AST 15 - 41 U/L 11  15  14    ALT 0 - 44 U/L 6  8  11  No results found for: "MG" No results found for: "CA2729"   Contraindications Contraindications were reviewed? Yes Contraindications to therapy were identified? No   Safety Precautions The following safety precautions for the use of trastuzumab and pertuzumab were reviewed:  Fever: reviewed the importance of having a thermometer and the Centers for Disease Control and Prevention (CDC) definition of fever which is 100.7F (38C) or higher. Patient should call 24/7 triage at [phone] if experiencing a fever or any other symptoms Diarrhea Fatigue Nausea or vomiting Cardiotoxicity Infusion reactions Headache Muscle or joint pain or weakness Pneumonitis Handling body fluids and waste Intimacy, sexual activity, contraception, and fertility  Medication Reconciliation Current Outpatient Medications  Medication Sig Dispense Refill   LORazepam (ATIVAN) 1 MG tablet Take 1 mg by mouth every 8 (eight) hours. As needed     losartan (COZAAR) 25 MG tablet Take 1 tablet (25 mg total) by mouth at bedtime. 90 tablet 3   metoprolol succinate (TOPROL XL) 25 MG 24 hr tablet Take 1 tablet (25 mg total) by mouth daily. 90 tablet 3   lidocaine-prilocaine (EMLA) cream Apply to affected area once (Patient not taking: Reported on 01/21/2023) 30 g 3   No current facility-administered medications for this visit.   Medication reconciliation is based on the  patient's most recent medication list in the electronic medical record (EMR) including herbal products and OTC medications.   The patient's medication list was reviewed today with the patient? Yes   Drug-drug interactions (DDIs) DDIs were evaluated? Yes Significant DDIs identified? No   Drug-Food Interactions Drug-food interactions were evaluated? Yes Drug-food interactions identified? No   Follow-up Plan  Treatment start date: 01/21/23 ECHO: 01/20/23 -- Per Dr. Pamelia Hoit, okay to use ECHO from May 2024 as baseline.  We reviewed the prescriptions, premedications, and treatment regimen with the patient. Possible side effects of the treatment regimen were reviewed and management strategies were discussed.  Can use loperamide as needed for diarrhea and Senna-S as needed for constipation. She requests port flush labs going forward. Will add this to follow up Was on this regimen per Dr. Welton Flakes at [facility] but was lost to follow up. Treatment information today Next cycle is 3 weeks from today. Scheduling made aware and treatment plan updated. She saw Dr. Pamelia Hoit today and he has cleared her for treatment. Clinical pharmacy will assist Dr. Serena Croissant and Christie Nottingham on an as needed basis going forward  Christie Nottingham participated in the discussion, expressed understanding, and voiced agreement with the above plan. All questions were answered to her satisfaction. The patient was advised to contact the clinic at [phone] with any questions or concerns prior to her return visit.   I spent 30 minutes assessing the patient.  Labria Wos A. Odetta Pink, PharmD, BCOP, CPP  Anselm Lis, RPH-CPP, 01/21/2023 9:32 AM  **Disclaimer: This note was dictated with voice recognition software. Similar sounding words can inadvertently be transcribed and this note may contain transcription errors which may not have been corrected upon publication of note.**

## 2023-01-21 NOTE — Patient Instructions (Addendum)
[facility] - A DEPT OF [facility]  Discharge Instructions: Thank you for choosing [facility] to provide your oncology and hematology care.   If you have a lab appointment with the Cancer Center, please go directly to the Cancer Center and check in at the registration area.   Wear comfortable clothing and clothing appropriate for easy access to any Portacath or PICC line.   We strive to give you quality time with your provider. You may need to reschedule your appointment if you arrive late (15 or more minutes).  Arriving late affects you and other patients whose appointments are after yours.  Also, if you miss three or more appointments without notifying the office, you may be dismissed from the clinic at the provider's discretion.      For prescription refill requests, have your pharmacy contact our office and allow 72 hours for refills to be completed.    Today you received the following chemotherapy and/or immunotherapy agents: Trastuzumab and hyaloronidase, Pertuzumab      To help prevent nausea and vomiting after your treatment, we encourage you to take your nausea medication as directed.  BELOW ARE SYMPTOMS THAT SHOULD BE REPORTED IMMEDIATELY: *FEVER GREATER THAN 100.4 F (38 C) OR HIGHER *CHILLS OR SWEATING *NAUSEA AND VOMITING THAT IS NOT CONTROLLED WITH YOUR NAUSEA MEDICATION *UNUSUAL SHORTNESS OF BREATH *UNUSUAL BRUISING OR BLEEDING *URINARY PROBLEMS (pain or burning when urinating, or frequent urination) *BOWEL PROBLEMS (unusual diarrhea, constipation, pain near the anus) TENDERNESS IN MOUTH AND THROAT WITH OR WITHOUT PRESENCE OF ULCERS (sore throat, sores in mouth, or a toothache) UNUSUAL RASH, SWELLING OR PAIN  UNUSUAL VAGINAL DISCHARGE OR ITCHING   Items with * indicate a potential emergency and should be followed up as soon as possible or go to the Emergency Department if any problems should occur.  Please show the  CHEMOTHERAPY ALERT CARD or IMMUNOTHERAPY ALERT CARD at check-in to the Emergency Department and triage nurse.  Should you have questions after your visit or need to cancel or reschedule your appointment, please contact [facility] - A DEPT OF [facility]  Dept: [phone]  and follow the prompts.  Office hours are 8:00 a.m. to 4:30 p.m. Monday - Friday. Please note that voicemails left after 4:00 p.m. may not be returned until the following business day.  We are closed weekends and major holidays. You have access to a nurse at all times for urgent questions. Please call the main number to the clinic Dept: [phone] and follow the prompts.   For any non-urgent questions, you may also contact your provider using MyChart. We now offer e-Visits for anyone 38 and older to request care online for non-urgent symptoms. For details visit [email or web address].   Also download the MyChart app! Go to the app store, search "MyChart", open the app, select [place], and log in with your MyChart username and password.  Trastuzumab Injection What is this medication? TRASTUZUMAB (tras TOO zoo mab) treats breast cancer and stomach cancer. It works by blocking a protein that causes cancer cells to grow and multiply. This helps to slow or stop the spread of cancer cells. This medicine may be used for other purposes; ask your health care provider or pharmacist if you have questions. COMMON BRAND NAME(S): Herceptin, Marlowe Alt, Ontruzant, Trazimera What should I tell my care team before I take this medication? They need to know if you have any of these conditions:  Heart failure Lung disease An unusual or allergic reaction to trastuzumab, other medications, foods, dyes, or preservatives Pregnant or trying to get pregnant Breast-feeding How should I use this medication? This medication is injected into a vein. It is given by your care team in a hospital or  clinic setting. Talk to your care team about the use of this medication in children. It is not approved for use in children. Overdosage: If you think you have taken too much of this medicine contact a poison control center or emergency room at once. NOTE: This medicine is only for you. Do not share this medicine with others. What if I miss a dose? Keep appointments for follow-up doses. It is important not to miss your dose. Call your care team if you are unable to keep an appointment. What may interact with this medication? Certain types of chemotherapy, such as daunorubicin, doxorubicin, epirubicin, idarubicin This list may not describe all possible interactions. Give your health care provider a list of all the medicines, herbs, non-prescription drugs, or dietary supplements you use. Also tell them if you smoke, drink alcohol, or use illegal drugs. Some items may interact with your medicine. What should I watch for while using this medication? Your condition will be monitored carefully while you are receiving this medication. This medication may make you feel generally unwell. This is not uncommon, as chemotherapy affects healthy cells as well as cancer cells. Report any side effects. Continue your course of treatment even though you feel ill unless your care team tells you to stop. This medication may increase your risk of getting an infection. Call your care team for advice if you get a fever, chills, sore throat, or other symptoms of a cold or flu. Do not treat yourself. Try to avoid being around people who are sick. Avoid taking medications that contain aspirin, acetaminophen, ibuprofen, naproxen, or ketoprofen unless instructed by your care team. These medications can hide a fever. Talk to your care team if you may be pregnant. Serious birth defects can occur if you take this medication during pregnancy and for 7 months after the last dose. You will need a negative pregnancy test before starting  this medication. Contraception is recommended while taking this medication and for 7 months after the last dose. Your care team can help you find the option that works for you. Do not breastfeed while taking this medication and for 7 months after stopping treatment. What side effects may I notice from receiving this medication? Side effects that you should report to your care team as soon as possible: Allergic reactions or angioedema--skin rash, itching or hives, swelling of the face, eyes, lips, tongue, arms, or legs, trouble swallowing or breathing Dry cough, shortness of breath or trouble breathing Heart failure--shortness of breath, swelling of the ankles, feet, or hands, sudden weight gain, unusual weakness or fatigue Infection--fever, chills, cough, or sore throat Infusion reactions--chest pain, shortness of breath or trouble breathing, feeling faint or lightheaded Side effects that usually do not require medical attention (report to your care team if they continue or are bothersome): Diarrhea Dizziness Headache Nausea Trouble sleeping Vomiting This list may not describe all possible side effects. Call your doctor for medical advice about side effects. You may report side effects to FDA at 1-800-FDA-1088. Where should I keep my medication? This medication is given in a hospital or clinic. It will not be stored at home. NOTE: This sheet is a summary. It may not cover all possible information. If you  have questions about this medicine, talk to your doctor, pharmacist, or health care provider.  2024 Elsevier/Gold Standard (2021-06-26 00:00:00)  Pertuzumab Injection What is this medication? PERTUZUMAB (per TOOZ ue mab) treats breast cancer. It works by blocking a protein that causes cancer cells to grow and multiply. This helps to slow or stop the spread of cancer cells. It is a monoclonal antibody. This medicine may be used for other purposes; ask your health care provider or pharmacist  if you have questions. COMMON BRAND NAME(S): PERJETA What should I tell my care team before I take this medication? They need to know if you have any of these conditions: Heart failure An unusual or allergic reaction to pertuzumab, other medications, foods, dyes, or preservatives Pregnant or trying to get pregnant Breast-feeding How should I use this medication? This medication is injected into a vein. It is given by your care team in a hospital or clinic setting. Talk to your care team about the use of this medication in children. Special care may be needed. Overdosage: If you think you have taken too much of this medicine contact a poison control center or emergency room at once. NOTE: This medicine is only for you. Do not share this medicine with others. What if I miss a dose? Keep appointments for follow-up doses. It is important not to miss your dose. Call your care team if you are unable to keep an appointment. What may interact with this medication? Interactions are not expected. This list may not describe all possible interactions. Give your health care provider a list of all the medicines, herbs, non-prescription drugs, or dietary supplements you use. Also tell them if you smoke, drink alcohol, or use illegal drugs. Some items may interact with your medicine. What should I watch for while using this medication? Your condition will be monitored carefully while you are receiving this medication. This medication may make you feel generally unwell. This is not uncommon as chemotherapy can affect healthy cells as well as cancer cells. Report any side effects. Continue your course of treatment even though you feel ill unless your care team tells you to stop. Talk to your care team if you may be pregnant. Serious birth defects can occur if you take this medication during pregnancy and for 7 months after the last dose. You will need a negative pregnancy test before starting this medication.  Contraception is recommended while taking this medication and for 7 months after the last dose. Your care team can help you find the option that works for you. Do not breastfeed while taking this medication and for 7 months after the last dose. What side effects may I notice from receiving this medication? Side effects that you should report to your care team as soon as possible: Allergic reactions or angioedema--skin rash, itching or hives, swelling of the face, eyes, lips, tongue, arms, or legs, trouble swallowing or breathing Heart failure--shortness of breath, swelling of the ankles, feet, or hands, sudden weight gain, unusual weakness or fatigue Infusion reactions--chest pain, shortness of breath or trouble breathing, feeling faint or lightheaded Side effects that usually do not require medical attention (report to your care team if they continue or are bothersome): Diarrhea Dry skin Fatigue Hair loss Nausea Vomiting This list may not describe all possible side effects. Call your doctor for medical advice about side effects. You may report side effects to FDA at 1-800-FDA-1088. Where should I keep my medication? This medication is given in a hospital or clinic. It  will not be stored at home. NOTE: This sheet is a summary. It may not cover all possible information. If you have questions about this medicine, talk to your doctor, pharmacist, or health care provider.  2024 Elsevier/Gold Standard (2021-06-26 00:00:00)

## 2023-01-21 NOTE — Assessment & Plan Note (Addendum)
06/16/2020 stage IV: T2 N1 M1 ER/PR negative HER2 positive metastatic breast cancer (diagnosed and treated by Dr. Welton Flakes) 08/09/2020 -11/02/2020 6 cycles of THP, on Herceptin and Perjeta maintenance Right breast DCIS Current treatment: Herceptin Perjeta maintenance since 12/14/2020 (discontinued November 2023 after she left Dr. Milta Deiters clinic)   CT CAP and bone scan 12/02/2022: 2 solid right breast masses both increase in size since 04/26/2022, newly enlarged right axillary lymph nodes suspicious for recurrent nodal mets, small 1.1 cm nodule mediastinum, small hypodense liver lesions are stable.  Bone scan: No bone metastasis   Treatment plan: Resume Herceptin and Perjeta Echocardiogram completed 01/20/2023: Echo by Dr. Gala Romney 01/20/23 EF 60-65%, GLS -16.9%    Refer patient to counselors for emotional support and management of depression. Return to clinic every 3 weeks for Herceptin Perjeta and every 6 weeks for follow-up with me.

## 2023-01-21 NOTE — Progress Notes (Signed)
[facility] Clinical Social Work  Initial Assessment   DORYS STEINERT is a 37 y.o. year old female presenting alone. Clinical Social Work was referred by nurse for assessment of psychosocial needs.   SDOH (Social Determinants of Health) assessments performed: Yes SDOH Interventions    Flowsheet Row Clinical Support from 01/21/2023 in [facility] - A Dept Of [facility]  SDOH Interventions   Food Insecurity Interventions Other (Comment)  [facility] food pantry, food pantry list, instructions for SNAP]  Housing Interventions Other (Comment)  [referral for SSDI for income to help with housing]  Transportation Interventions Patient Resources (Friends/Family), Payor Benefit       SDOH Screenings   Food Insecurity: Food Insecurity Present (01/21/2023)  Housing: Medium Risk (01/21/2023)  Transportation Needs: Unmet Transportation Needs (01/21/2023)  Tobacco Use: High Risk (01/20/2023)     Distress Screen completed: No     No data to display            Family/Social Information:  Housing Arrangement: patient lives with her daughter (15 yo). They are currently staying at her mom's house.  Pt's 14yo daughter lives in [place]. Family members/support persons in your life? Friends Transportation concerns: yes, does not have her own transportation. Currently using her mom's car. Also has benefit through Medicaid  Employment: Unemployed .  Income source: No income Financial concerns: Yes, current concerns Type of concern: Food and getting her own housing Food access concerns: yes, cost concerns. Has not applied for SNAP yet Religious or spiritual practice: Not known Services Currently in place:  Medicaid ([employer]), connected with [facility] chaplain  Coping/ Adjustment to diagnosis: Patient understands treatment plan and what happens next? yes, restarting treatment today Concerns about diagnosis and/or treatment: Overwhelmed by information Patient reported  stressors: Housing and [employer] and/or priorities: hopes to be able to get housing for herself and her daughter Current coping skills/ strengths: Active sense of humor  and Communication skills     SUMMARY: Current SDOH Barriers:  Financial constraints related to being out of work and cancer  Clinical Social Work Clinical Goal(s):  Scientist, research (life sciences) options for unmet needs related to:  Housing , English as a second language teacher, Corporate treasurer , and Food Insecurity   Interventions: Informed patient of the support team roles and support services at [facility] Provided CSW contact information and encouraged patient to call with any questions or concerns Referred patient to [employer] for  help with disability application CSW completed [employer] application for pt with permission CSW provided information on additional cancer foundations for potential financial support Provided bags of food & toiletries today, list of food pantries, list of community resources. Encouraged pt to apply for food stamps.   Follow Up Plan: Patient will respond to [facility] for disability application. Patient will notify CSW of questions, concerns, or future need for items from food pantry Patient verbalizes understanding of plan: Yes    Kyrie Fludd E Merion Caton, LCSW Clinical Social Worker [facility]  Patient is participating in a Managed Medicaid Plan:  Yes

## 2023-01-22 ENCOUNTER — Other Ambulatory Visit: Payer: Self-pay

## 2023-01-22 LAB — CANCER ANTIGEN 15-3: CA 15-3: 10.7 U/mL (ref 0.0–25.0)

## 2023-01-23 LAB — CANCER ANTIGEN 27.29: CA 27.29: 14.9 U/mL (ref 0.0–38.6)

## 2023-02-03 ENCOUNTER — Ambulatory Visit: Payer: Medicaid Other | Admitting: Hematology and Oncology

## 2023-02-03 ENCOUNTER — Ambulatory Visit: Payer: Medicaid Other

## 2023-02-12 ENCOUNTER — Telehealth: Payer: Self-pay | Admitting: *Deleted

## 2023-02-12 ENCOUNTER — Inpatient Hospital Stay: Payer: Medicaid Other

## 2023-02-12 ENCOUNTER — Inpatient Hospital Stay: Payer: Medicaid Other | Admitting: Hematology and Oncology

## 2023-02-12 ENCOUNTER — Inpatient Hospital Stay: Payer: Medicaid Other | Attending: Hematology and Oncology

## 2023-02-12 ENCOUNTER — Encounter: Payer: Self-pay | Admitting: General Practice

## 2023-02-12 VITALS — BP 138/101 | HR 76 | Temp 98.7°F | Resp 18

## 2023-02-12 DIAGNOSIS — C50911 Malignant neoplasm of unspecified site of right female breast: Secondary | ICD-10-CM

## 2023-02-12 DIAGNOSIS — C787 Secondary malignant neoplasm of liver and intrahepatic bile duct: Secondary | ICD-10-CM | POA: Insufficient documentation

## 2023-02-12 DIAGNOSIS — C50311 Malignant neoplasm of lower-inner quadrant of right female breast: Secondary | ICD-10-CM | POA: Diagnosis present

## 2023-02-12 DIAGNOSIS — Z5112 Encounter for antineoplastic immunotherapy: Secondary | ICD-10-CM | POA: Insufficient documentation

## 2023-02-12 DIAGNOSIS — Z171 Estrogen receptor negative status [ER-]: Secondary | ICD-10-CM | POA: Diagnosis not present

## 2023-02-12 MED ORDER — SODIUM CHLORIDE 0.9 % IV SOLN
420.0000 mg | Freq: Once | INTRAVENOUS | Status: AC
  Administered 2023-02-12: 420 mg via INTRAVENOUS
  Filled 2023-02-12: qty 14

## 2023-02-12 MED ORDER — DIPHENHYDRAMINE HCL 25 MG PO CAPS
50.0000 mg | ORAL_CAPSULE | Freq: Once | ORAL | Status: AC
  Administered 2023-02-12: 50 mg via ORAL
  Filled 2023-02-12: qty 2

## 2023-02-12 MED ORDER — SODIUM CHLORIDE 0.9 % IV SOLN: INTRAVENOUS | Status: DC

## 2023-02-12 MED ORDER — HEPARIN SOD (PORK) LOCK FLUSH 100 UNIT/ML IV SOLN
500.0000 [IU] | Freq: Once | INTRAVENOUS | Status: AC | PRN
Start: 2023-02-12 — End: 2023-02-12
  Administered 2023-02-12: 500 [IU]

## 2023-02-12 MED ORDER — SODIUM CHLORIDE 0.9% FLUSH
10.0000 mL | INTRAVENOUS | Status: DC | PRN
Start: 2023-02-12 — End: 2023-02-12
  Administered 2023-02-12: 10 mL

## 2023-02-12 MED ORDER — TRASTUZUMAB-ANNS CHEMO 150 MG IV SOLR
6.0000 mg/kg | Freq: Once | INTRAVENOUS | Status: AC
  Administered 2023-02-12: 357 mg via INTRAVENOUS
  Filled 2023-02-12: qty 17

## 2023-02-12 MED ORDER — ACETAMINOPHEN 325 MG PO TABS
650.0000 mg | ORAL_TABLET | Freq: Once | ORAL | Status: AC
  Administered 2023-02-12: 650 mg via ORAL
  Filled 2023-02-12: qty 2

## 2023-02-12 NOTE — Telephone Encounter (Signed)
RN attempt x1 to contact pt regarding missed appt today.  No answer, LVM for pt to return call to the office.

## 2023-02-12 NOTE — Assessment & Plan Note (Deleted)
06/16/2020 stage IV: T2 N1 M1 ER/PR negative HER2 positive metastatic breast cancer (diagnosed and treated by Dr. Welton Flakes) 08/09/2020 -11/02/2020 6 cycles of THP, on Herceptin and Perjeta maintenance Right breast DCIS Current treatment: Herceptin Perjeta maintenance since 12/14/2020 (discontinued November 2023 after she left Dr. Milta Deiters clinic)   CT CAP and bone scan 12/02/2022: 2 solid right breast masses both increase in size since 04/26/2022, newly enlarged right axillary lymph nodes suspicious for recurrent nodal mets, small 1.1 cm nodule mediastinum, small hypodense liver lesions are stable.  Bone scan: No bone metastasis   Treatment plan: Resumed Herceptin and Perjeta Echocardiogram completed 01/20/2023: Echo by Dr. Gala Romney 01/20/23 EF 60-65%, GLS -16.9%  Depression: Counselors offered emotional support.  Return to clinic every 3 weeks for Herceptin Perjeta and every 6 weeks for follow-up with me.   -If insufficient response, consider stronger treatment options.

## 2023-02-12 NOTE — Patient Instructions (Signed)
[facility] - A DEPT OF [facility]  Discharge Instructions: Thank you for choosing [facility] to provide your oncology and hematology care.   If you have a lab appointment with the Cancer Center, please go directly to the Cancer Center and check in at the registration area.   Wear comfortable clothing and clothing appropriate for easy access to any Portacath or PICC line.   We strive to give you quality time with your provider. You may need to reschedule your appointment if you arrive late (15 or more minutes).  Arriving late affects you and other patients whose appointments are after yours.  Also, if you miss three or more appointments without notifying the office, you may be dismissed from the clinic at the provider's discretion.      For prescription refill requests, have your pharmacy contact our office and allow 72 hours for refills to be completed.    Today you received the following chemotherapy and/or immunotherapy agents: Kanjinti, Perjeta      To help prevent nausea and vomiting after your treatment, we encourage you to take your nausea medication as directed.  BELOW ARE SYMPTOMS THAT SHOULD BE REPORTED IMMEDIATELY: *FEVER GREATER THAN 100.4 F (38 C) OR HIGHER *CHILLS OR SWEATING *NAUSEA AND VOMITING THAT IS NOT CONTROLLED WITH YOUR NAUSEA MEDICATION *UNUSUAL SHORTNESS OF BREATH *UNUSUAL BRUISING OR BLEEDING *URINARY PROBLEMS (pain or burning when urinating, or frequent urination) *BOWEL PROBLEMS (unusual diarrhea, constipation, pain near the anus) TENDERNESS IN MOUTH AND THROAT WITH OR WITHOUT PRESENCE OF ULCERS (sore throat, sores in mouth, or a toothache) UNUSUAL RASH, SWELLING OR PAIN  UNUSUAL VAGINAL DISCHARGE OR ITCHING   Items with * indicate a potential emergency and should be followed up as soon as possible or go to the Emergency Department if any problems should occur.  Please show the CHEMOTHERAPY ALERT CARD or  IMMUNOTHERAPY ALERT CARD at check-in to the Emergency Department and triage nurse.  Should you have questions after your visit or need to cancel or reschedule your appointment, please contact [facility] - A DEPT OF [facility]  Dept: [phone]  and follow the prompts.  Office hours are 8:00 a.m. to 4:30 p.m. Monday - Friday. Please note that voicemails left after 4:00 p.m. may not be returned until the following business day.  We are closed weekends and major holidays. You have access to a nurse at all times for urgent questions. Please call the main number to the clinic Dept: [phone] and follow the prompts.   For any non-urgent questions, you may also contact your provider using MyChart. We now offer e-Visits for anyone 77 and older to request care online for non-urgent symptoms. For details visit [email or web address].   Also download the MyChart app! Go to the app store, search "MyChart", open the app, select [place], and log in with your MyChart username and password.

## 2023-02-12 NOTE — Progress Notes (Signed)
Pt declined observation post Perjeta infusion. Pt tolerated Tx well w/out incident. Ambulatory to lobby.

## 2023-02-12 NOTE — Progress Notes (Signed)
[facility] Spiritual Care Note  Made brief contact with Rozlyn by phone this morning when she had missed MD appointment but was still planning to make today's infusion appointment, but see now that both appointments been rescheduled to 1/7. Plan to follow up in person, which is her preference, at that time.   [address] Rush Barer, [place], [facility] Pager [phone] Voicemail [phone]

## 2023-02-16 ENCOUNTER — Other Ambulatory Visit: Payer: Self-pay

## 2023-02-24 ENCOUNTER — Ambulatory Visit: Payer: Medicaid Other | Admitting: Adult Health

## 2023-02-24 ENCOUNTER — Ambulatory Visit: Payer: Medicaid Other

## 2023-03-04 ENCOUNTER — Encounter: Payer: Self-pay | Admitting: Adult Health

## 2023-03-04 ENCOUNTER — Encounter: Payer: Self-pay | Admitting: General Practice

## 2023-03-04 ENCOUNTER — Inpatient Hospital Stay: Payer: Medicaid Other

## 2023-03-04 ENCOUNTER — Inpatient Hospital Stay: Payer: Medicaid Other | Attending: Hematology and Oncology | Admitting: Adult Health

## 2023-03-04 ENCOUNTER — Encounter: Payer: Self-pay | Admitting: Hematology and Oncology

## 2023-03-04 VITALS — BP 142/102 | HR 93 | Temp 98.7°F | Resp 18 | Wt 124.4 lb

## 2023-03-04 DIAGNOSIS — C50311 Malignant neoplasm of lower-inner quadrant of right female breast: Secondary | ICD-10-CM | POA: Insufficient documentation

## 2023-03-04 DIAGNOSIS — F17210 Nicotine dependence, cigarettes, uncomplicated: Secondary | ICD-10-CM | POA: Diagnosis not present

## 2023-03-04 DIAGNOSIS — C7951 Secondary malignant neoplasm of bone: Secondary | ICD-10-CM | POA: Diagnosis not present

## 2023-03-04 DIAGNOSIS — C787 Secondary malignant neoplasm of liver and intrahepatic bile duct: Secondary | ICD-10-CM | POA: Diagnosis present

## 2023-03-04 DIAGNOSIS — Z171 Estrogen receptor negative status [ER-]: Secondary | ICD-10-CM | POA: Insufficient documentation

## 2023-03-04 DIAGNOSIS — Z5112 Encounter for antineoplastic immunotherapy: Secondary | ICD-10-CM | POA: Insufficient documentation

## 2023-03-04 DIAGNOSIS — C50911 Malignant neoplasm of unspecified site of right female breast: Secondary | ICD-10-CM

## 2023-03-04 MED ORDER — DIPHENHYDRAMINE HCL 25 MG PO CAPS
50.0000 mg | ORAL_CAPSULE | Freq: Once | ORAL | Status: AC
  Administered 2023-03-04: 50 mg via ORAL
  Filled 2023-03-04: qty 2

## 2023-03-04 MED ORDER — SODIUM CHLORIDE 0.9 % IV SOLN: INTRAVENOUS | Status: DC

## 2023-03-04 MED ORDER — ACETAMINOPHEN 325 MG PO TABS
650.0000 mg | ORAL_TABLET | Freq: Once | ORAL | Status: AC
  Administered 2023-03-04: 650 mg via ORAL
  Filled 2023-03-04: qty 2

## 2023-03-04 MED ORDER — TRASTUZUMAB-ANNS CHEMO 150 MG IV SOLR
6.0000 mg/kg | Freq: Once | INTRAVENOUS | Status: AC
  Administered 2023-03-04: 357 mg via INTRAVENOUS
  Filled 2023-03-04: qty 17

## 2023-03-04 MED ORDER — ALTEPLASE 2 MG IJ SOLR
2.0000 mg | Freq: Once | INTRAMUSCULAR | Status: AC | PRN
  Administered 2023-03-04: 2 mg
  Filled 2023-03-04: qty 2

## 2023-03-04 MED ORDER — SODIUM CHLORIDE 0.9 % IV SOLN
420.0000 mg | Freq: Once | INTRAVENOUS | Status: AC
  Administered 2023-03-04: 420 mg via INTRAVENOUS
  Filled 2023-03-04: qty 14

## 2023-03-04 NOTE — Progress Notes (Signed)
 [facility] Spiritual Care Note  Greeted Connie Wells today in infusion, offering a longer visit. She is not interested at this time, but is aware of ongoing Spiritual Care availability in case needs arise or circumstances change.   [address] Connie Wells, [place], [facility] Pager [phone] Voicemail [phone]

## 2023-03-04 NOTE — Patient Instructions (Signed)
 [facility] - A DEPT OF [place]  Discharge Instructions: Thank you for choosing [facility] to provide your oncology and hematology care.   If you have a lab appointment with the Cancer Center, please go directly to the Cancer Center and check in at the registration area.   Wear comfortable clothing and clothing appropriate for easy access to any Portacath or PICC line.   We strive to give you quality time with your provider. You may need to reschedule your appointment if you arrive late (15 or more minutes).  Arriving late affects you and other patients whose appointments are after yours.  Also, if you miss three or more appointments without notifying the office, you may be dismissed from the clinic at the provider's discretion.      For prescription refill requests, have your pharmacy contact our office and allow 72 hours for refills to be completed.    Today you received the following chemotherapy and/or immunotherapy agents: Trastuzumab, Pertuzumab      To help prevent nausea and vomiting after your treatment, we encourage you to take your nausea medication as directed.  BELOW ARE SYMPTOMS THAT SHOULD BE REPORTED IMMEDIATELY: *FEVER GREATER THAN 100.4 F (38 C) OR HIGHER *CHILLS OR SWEATING *NAUSEA AND VOMITING THAT IS NOT CONTROLLED WITH YOUR NAUSEA MEDICATION *UNUSUAL SHORTNESS OF BREATH *UNUSUAL BRUISING OR BLEEDING *URINARY PROBLEMS (pain or burning when urinating, or frequent urination) *BOWEL PROBLEMS (unusual diarrhea, constipation, pain near the anus) TENDERNESS IN MOUTH AND THROAT WITH OR WITHOUT PRESENCE OF ULCERS (sore throat, sores in mouth, or a toothache) UNUSUAL RASH, SWELLING OR PAIN  UNUSUAL VAGINAL DISCHARGE OR ITCHING   Items with * indicate a potential emergency and should be followed up as soon as possible or go to the Emergency Department if any problems should occur.  Please show the CHEMOTHERAPY ALERT CARD or  IMMUNOTHERAPY ALERT CARD at check-in to the Emergency Department and triage nurse.  Should you have questions after your visit or need to cancel or reschedule your appointment, please contact [facility] - A DEPT OF [facility]  Dept: [phone]  and follow the prompts.  Office hours are 8:00 a.m. to 4:30 p.m. Monday - Friday. Please note that voicemails left after 4:00 p.m. may not be returned until the following business day.  We are closed weekends and major holidays. You have access to a nurse at all times for urgent questions. Please call the main number to the clinic Dept: [phone] and follow the prompts.   For any non-urgent questions, you may also contact your provider using MyChart. We now offer e-Visits for anyone 41 and older to request care online for non-urgent symptoms. For details visit [email or web address].   Also download the MyChart app! Go to the app store, search MyChart, open the app, select [facility], and log in with your MyChart username and password.

## 2023-03-04 NOTE — Assessment & Plan Note (Signed)
 06/16/2020 stage IV: T2 N1 M1 ER/PR negative HER2 positive metastatic breast cancer (diagnosed and treated by Dr. Fernand) 08/09/2020 -11/02/2020 6 cycles of THP, on Herceptin  and Perjeta  maintenance Right breast DCIS Current treatment: Herceptin  Perjeta  maintenance since 12/14/2020 (discontinued November 2023 after she left Dr. Kathern clinic)   CT CAP and bone scan 12/02/2022: 2 solid right breast masses both increase in size since 04/26/2022, newly enlarged right axillary lymph nodes suspicious for recurrent nodal mets, small 1.1 cm nodule mediastinum, small hypodense liver lesions are stable.  Bone scan: No bone metastasis   Treatment plan: Resume Herceptin  and Perjeta  Echocardiogram completed 01/20/2023: Echo by Dr. Cherrie 01/20/23 EF 60-65%, GLS -16.9%   Breast Cancer   Patient reports significant reduction in size of right breast mass with Herceptin . No pain associated with the mass.   -Continue treatment with Herceptin /Perjeta  every 3 weeks.   -Plan for imaging in the next 1-2 treatment cycles to confirm clinical improvement.    Possible Hernia   Patient reports a palpable soft mass above the umbilicus, which she can manipulate, suggesting a possible hernia. No pain or discomfort associated with the mass.   -Consider further evaluation with imaging to confirm diagnosis and determine need for surgical intervention.    Pulmonary Nodules and Liver Lesions   Stable as of last scan in October.   -Plan for repeat imaging in the next 1-2 treatment cycles to monitor for any changes.    Anxiety related to Cancer Diagnosis   Patient expresses concern about various physical sensations, fearing she may be related to cancer progression.   -Reassure patient that her concerns are normal and encourage her to report any persistent or worsening symptoms.  RTC in 3 weeks for f/u, and treatment

## 2023-03-04 NOTE — Progress Notes (Signed)
 [facility] Cancer Follow up:    Default, Provider, MD No address on file   DIAGNOSIS: Metastatic breast cancer  SUMMARY OF ONCOLOGIC HISTORY: Oncology History  Breast cancer metastasized to multiple sites (HCC)  06/16/2020 Initial Diagnosis   [place]:  stage IV: T2 N1 M1 ER/PR negative HER2 positive metastatic breast cancer (diagnosed and treated by Dr. Fernand) 08/09/2020 -11/02/2020 6 cycles of THP, on Herceptin  and Perjeta  maintenance Right breast DCIS   01/21/2023 -  Chemotherapy   Patient is on Treatment Plan : BREAST Trastuzumab  + Pertuzumab  q21d       CURRENT THERAPY: Herceptin  and Perjeta  maitnenance  INTERVAL HISTORY: Connie Wells 38 y.o. female currently on Herceptin  Perjeta  for cancer treatment. The patient also reports a significant reduction in the size of the previously diagnosed cancerous lump in the right breast. The patient denies any pain caused by the cancer and reports no cough, shortness of breath, chest pain, or irregular heartbeats. The patient also mentions having lesions in the liver and lungs, which were stable at the last scan in October.  She also reports a small area above her umbilicus that is soft and protrudes from time to time.     Patient Active Problem List   Diagnosis Date Noted   Breast cancer metastasized to multiple sites [facility]) 08/21/2022   Cellulitis of pharynx    Pharyngitis 04/20/2015   Postpartum care and examination 12/27/2013   Contraception management 12/27/2013   GBS bacteriuria 10/14/2013   Drug use complicating pregnancy in second trimester 07/06/2013   Hx of pyelonephritis 06/08/2013   HSV-2 infection complicating pregnancy 06/08/2013   SOLITARY KIDNEY 05/28/2006   ANA POSITIVE, HX OF 05/02/2006    has no known allergies.  MEDICAL HISTORY: Past Medical History:  Diagnosis Date   Herpes genitalia    Solitary kidney     SURGICAL HISTORY: Past Surgical History:  Procedure Laterality Date    DILATION AND EVACUATION     ECTOPIC PREGNANCY SURGERY      SOCIAL HISTORY: Social History   Socioeconomic History   Marital status: Single    Spouse name: Not on file   Number of children: Not on file   Years of education: Not on file   Highest education level: Not on file  Occupational History   Not on file  Tobacco Use   Smoking status: Every Day    Current packs/day: 0.50    Average packs/day: 0.5 packs/day for 5.0 years (2.5 ttl pk-yrs)    Types: Cigarettes   Smokeless tobacco: Never  Substance and Sexual Activity   Alcohol use: No    Comment: Occassional   Drug use: Yes    Types: Marijuana   Sexual activity: Yes    Birth control/protection: None  Other Topics Concern   Not on file  Social History Narrative   Not on file   Social Drivers of Health   Financial Resource Strain: Not on file  Food Insecurity: Food Insecurity Present (01/21/2023)   Hunger Vital Sign    Worried About Running Out of Food in the Last Year: Sometimes true    Ran Out of Food in the Last Year: Sometimes true  Transportation Needs: Unmet Transportation Needs (01/21/2023)   PRAPARE - Administrator, Civil Service (Medical): Yes    Lack of Transportation (Non-Medical): No  Physical Activity: Not on file  Stress: Not on file  Social Connections: Not on file  Intimate Partner Violence: Not on file  FAMILY HISTORY: Family History  Problem Relation Age of Onset   Diabetes Mother    Hypertension Mother    Diabetes Maternal Aunt    Asthma Brother     Review of Systems  Constitutional:  Negative for appetite change, chills, fatigue, fever and unexpected weight change.  HENT:   Negative for hearing loss, lump/mass and trouble swallowing.   Eyes:  Negative for eye problems and icterus.  Respiratory:  Negative for chest tightness, cough and shortness of breath.   Cardiovascular:  Negative for chest pain, leg swelling and palpitations.  Gastrointestinal:  Negative for  abdominal distention, abdominal pain, constipation, diarrhea, nausea and vomiting.  Endocrine: Negative for hot flashes.  Genitourinary:  Negative for difficulty urinating.   Musculoskeletal:  Negative for arthralgias.  Skin:  Negative for itching and rash.  Neurological:  Negative for dizziness, extremity weakness, headaches and numbness.  Hematological:  Negative for adenopathy. Does not bruise/bleed easily.  Psychiatric/Behavioral:  Negative for depression. The patient is not nervous/anxious.       PHYSICAL EXAMINATION    Vitals:   03/04/23 1335  BP: (!) 142/102  Pulse: 93  Resp: 18  Temp: 98.7 F (37.1 C)  SpO2: 100%    Physical Exam Constitutional:      General: She is not in acute distress.    Appearance: Normal appearance. She is not toxic-appearing.  HENT:     Head: Normocephalic and atraumatic.     Mouth/Throat:     Mouth: Mucous membranes are moist.     Pharynx: Oropharynx is clear. No oropharyngeal exudate or posterior oropharyngeal erythema.  Eyes:     General: No scleral icterus. Cardiovascular:     Rate and Rhythm: Normal rate and regular rhythm.     Pulses: Normal pulses.     Heart sounds: Normal heart sounds.  Pulmonary:     Effort: Pulmonary effort is normal.     Breath sounds: Normal breath sounds.  Abdominal:     General: Abdomen is flat. Bowel sounds are normal. There is no distension.     Palpations: Abdomen is soft.     Tenderness: There is no abdominal tenderness.  Musculoskeletal:        General: No swelling.     Cervical back: Neck supple.  Lymphadenopathy:     Cervical: No cervical adenopathy.  Skin:    General: Skin is warm and dry.     Findings: No rash.  Neurological:     General: No focal deficit present.     Mental Status: She is alert.  Psychiatric:        Mood and Affect: Mood normal.        Behavior: Behavior normal.     LABORATORY DATA:  CBC    Component Value Date/Time   WBC 5.7 01/21/2023 0838   WBC 10.9 (H)  04/23/2015 0341   RBC 3.41 (L) 01/21/2023 0838   HGB 11.1 (L) 01/21/2023 0838   HCT 33.6 (L) 01/21/2023 0838   PLT 322 01/21/2023 0838   MCV 98.5 01/21/2023 0838   MCH 32.6 01/21/2023 0838   MCHC 33.0 01/21/2023 0838   RDW 14.2 01/21/2023 0838   LYMPHSABS 1.7 01/21/2023 0838   MONOABS 0.5 01/21/2023 0838   EOSABS 0.2 01/21/2023 0838   BASOSABS 0.1 01/21/2023 0838    CMP     Component Value Date/Time   NA 139 01/21/2023 0838   K 3.7 01/21/2023 0838   CL 109 01/21/2023 0838   CO2 27 01/21/2023 9161  GLUCOSE 103 (H) 01/21/2023 0838   BUN 14 01/21/2023 0838   CREATININE 0.88 01/21/2023 0838   CREATININE 0.58 07/06/2013 1612   CALCIUM  8.6 (L) 01/21/2023 0838   PROT 8.2 (H) 01/21/2023 0838   ALBUMIN  3.3 (L) 01/21/2023 0838   AST 11 (L) 01/21/2023 0838   ALT 6 01/21/2023 0838   ALKPHOS 42 01/21/2023 0838   BILITOT 0.2 01/21/2023 0838   GFRNONAA >60 01/21/2023 0838   GFRAA >60 04/21/2015 0422       ASSESSMENT and THERAPY PLAN:   Breast cancer metastasized to multiple sites (HCC) 06/16/2020 stage IV: T2 N1 M1 ER/PR negative HER2 positive metastatic breast cancer (diagnosed and treated by Dr. Fernand) 08/09/2020 -11/02/2020 6 cycles of THP, on Herceptin  and Perjeta  maintenance Right breast DCIS Current treatment: Herceptin  Perjeta  maintenance since 12/14/2020 (discontinued November 2023 after she left Dr. Kathern clinic)   CT CAP and bone scan 12/02/2022: 2 solid right breast masses both increase in size since 04/26/2022, newly enlarged right axillary lymph nodes suspicious for recurrent nodal mets, small 1.1 cm nodule mediastinum, small hypodense liver lesions are stable.  Bone scan: No bone metastasis   Treatment plan: Resume Herceptin  and Perjeta  Echocardiogram completed 01/20/2023: Echo by Dr. Cherrie 01/20/23 EF 60-65%, GLS -16.9%   Breast Cancer   Patient reports significant reduction in size of right breast mass with Herceptin . No pain associated with the mass.   -Continue  treatment with Herceptin /Perjeta  every 3 weeks.   -Plan for imaging in the next 1-2 treatment cycles to confirm clinical improvement.    Possible Hernia   Patient reports a palpable soft mass above the umbilicus, which she can manipulate, suggesting a possible hernia. No pain or discomfort associated with the mass.   -Consider further evaluation with imaging to confirm diagnosis and determine need for surgical intervention.    Pulmonary Nodules and Liver Lesions   Stable as of last scan in October.   -Plan for repeat imaging in the next 1-2 treatment cycles to monitor for any changes.    Anxiety related to Cancer Diagnosis   Patient expresses concern about various physical sensations, fearing she may be related to cancer progression.   -Reassure patient that her concerns are normal and encourage her to report any persistent or worsening symptoms.  RTC in 3 weeks for f/u, and treatment    All questions were answered. The patient knows to call the clinic with any problems, questions or concerns. We can certainly see the patient much sooner if necessary.  Total encounter time:30 minutes*in face-to-face visit time, chart review, lab review, care coordination, order entry, and documentation of the encounter time.    Morna Kendall, NP 03/04/23 4:09 PM Medical Oncology and Hematology [facility] [address] [place], [place] [address] Tel. [phone]    Fax. [phone]  *Total Encounter Time as defined by the Centers for Medicare and Medicaid Services includes, in addition to the face-to-face time of a patient visit (documented in the note above) non-face-to-face time: obtaining and reviewing outside history, ordering and reviewing medications, tests or procedures, care coordination (communications with other health care professionals or caregivers) and documentation in the medical record.

## 2023-03-18 ENCOUNTER — Encounter (HOSPITAL_COMMUNITY): Payer: Self-pay

## 2023-03-18 ENCOUNTER — Ambulatory Visit (HOSPITAL_COMMUNITY)
Admission: RE | Admit: 2023-03-18 | Discharge: 2023-03-18 | Disposition: A | Discharge status: Home / Self Care | Payer: Medicaid Other | Source: Ambulatory Visit | Attending: Adult Health | Admitting: Adult Health

## 2023-03-18 ENCOUNTER — Emergency Department (HOSPITAL_COMMUNITY): Payer: Medicaid Other

## 2023-03-18 ENCOUNTER — Inpatient Hospital Stay (HOSPITAL_COMMUNITY)
Admission: EM | Admit: 2023-03-18 | Discharge: 2023-03-25 | DRG: 871 | Disposition: A | Discharge status: Home / Self Care | Payer: Medicaid Other | Attending: Internal Medicine | Admitting: Internal Medicine

## 2023-03-18 ENCOUNTER — Other Ambulatory Visit: Payer: Self-pay

## 2023-03-18 DIAGNOSIS — Z79899 Other long term (current) drug therapy: Secondary | ICD-10-CM

## 2023-03-18 DIAGNOSIS — C50911 Malignant neoplasm of unspecified site of right female breast: Secondary | ICD-10-CM | POA: Insufficient documentation

## 2023-03-18 DIAGNOSIS — J13 Pneumonia due to Streptococcus pneumoniae: Secondary | ICD-10-CM | POA: Diagnosis present

## 2023-03-18 DIAGNOSIS — I10 Essential (primary) hypertension: Secondary | ICD-10-CM | POA: Diagnosis present

## 2023-03-18 DIAGNOSIS — Z853 Personal history of malignant neoplasm of breast: Secondary | ICD-10-CM | POA: Diagnosis not present

## 2023-03-18 DIAGNOSIS — C7800 Secondary malignant neoplasm of unspecified lung: Secondary | ICD-10-CM | POA: Diagnosis present

## 2023-03-18 DIAGNOSIS — E872 Acidosis, unspecified: Secondary | ICD-10-CM | POA: Diagnosis present

## 2023-03-18 DIAGNOSIS — B953 Streptococcus pneumoniae as the cause of diseases classified elsewhere: Secondary | ICD-10-CM | POA: Diagnosis present

## 2023-03-18 DIAGNOSIS — J189 Pneumonia, unspecified organism: Secondary | ICD-10-CM | POA: Diagnosis not present

## 2023-03-18 DIAGNOSIS — Z8249 Family history of ischemic heart disease and other diseases of the circulatory system: Secondary | ICD-10-CM

## 2023-03-18 DIAGNOSIS — C50919 Malignant neoplasm of unspecified site of unspecified female breast: Secondary | ICD-10-CM | POA: Diagnosis present

## 2023-03-18 DIAGNOSIS — R590 Localized enlarged lymph nodes: Secondary | ICD-10-CM | POA: Diagnosis present

## 2023-03-18 DIAGNOSIS — N179 Acute kidney failure, unspecified: Secondary | ICD-10-CM | POA: Insufficient documentation

## 2023-03-18 DIAGNOSIS — I429 Cardiomyopathy, unspecified: Secondary | ICD-10-CM | POA: Diagnosis present

## 2023-03-18 DIAGNOSIS — F411 Generalized anxiety disorder: Secondary | ICD-10-CM | POA: Diagnosis not present

## 2023-03-18 DIAGNOSIS — R7881 Bacteremia: Secondary | ICD-10-CM | POA: Diagnosis present

## 2023-03-18 DIAGNOSIS — Q600 Renal agenesis, unilateral: Secondary | ICD-10-CM

## 2023-03-18 DIAGNOSIS — E876 Hypokalemia: Secondary | ICD-10-CM | POA: Diagnosis present

## 2023-03-18 DIAGNOSIS — F17210 Nicotine dependence, cigarettes, uncomplicated: Secondary | ICD-10-CM | POA: Diagnosis present

## 2023-03-18 DIAGNOSIS — C787 Secondary malignant neoplasm of liver and intrahepatic bile duct: Secondary | ICD-10-CM | POA: Diagnosis present

## 2023-03-18 DIAGNOSIS — D630 Anemia in neoplastic disease: Secondary | ICD-10-CM | POA: Diagnosis present

## 2023-03-18 DIAGNOSIS — R0682 Tachypnea, not elsewhere classified: Secondary | ICD-10-CM | POA: Diagnosis present

## 2023-03-18 DIAGNOSIS — A419 Sepsis, unspecified organism: Secondary | ICD-10-CM

## 2023-03-18 DIAGNOSIS — E279 Disorder of adrenal gland, unspecified: Secondary | ICD-10-CM | POA: Diagnosis present

## 2023-03-18 DIAGNOSIS — A403 Sepsis due to Streptococcus pneumoniae: Principal | ICD-10-CM | POA: Diagnosis present

## 2023-03-18 DIAGNOSIS — Z1152 Encounter for screening for COVID-19: Secondary | ICD-10-CM

## 2023-03-18 LAB — COMPREHENSIVE METABOLIC PANEL
ALT: 15 U/L (ref 0–44)
AST: 20 U/L (ref 15–41)
Albumin: 2.7 g/dL — ABNORMAL LOW (ref 3.5–5.0)
Alkaline Phosphatase: 62 U/L (ref 38–126)
Anion gap: 9 (ref 5–15)
BUN: 20 mg/dL (ref 6–20)
CO2: 22 mmol/L (ref 22–32)
Calcium: 8.4 mg/dL — ABNORMAL LOW (ref 8.9–10.3)
Chloride: 103 mmol/L (ref 98–111)
Creatinine, Ser: 1.18 mg/dL — ABNORMAL HIGH (ref 0.44–1.00)
GFR, Estimated: >60 mL/min (ref >60)
Glucose, Bld: 108 mg/dL — ABNORMAL HIGH (ref 70–99)
Potassium: 3.6 mmol/L (ref 3.5–5.1)
Sodium: 134 mmol/L — ABNORMAL LOW (ref 135–145)
Total Bilirubin: 0.7 mg/dL (ref 0.0–1.2)
Total Protein: 8.1 g/dL (ref 6.5–8.1)

## 2023-03-18 LAB — CBC
HCT: 33.2 % — ABNORMAL LOW (ref 36.0–46.0)
Hemoglobin: 11.4 g/dL — ABNORMAL LOW (ref 12.0–15.0)
MCH: 32.5 pg (ref 26.0–34.0)
MCHC: 34.3 g/dL (ref 30.0–36.0)
MCV: 94.6 fL (ref 80.0–100.0)
Platelets: 266 10*3/uL (ref 150–400)
RBC: 3.51 MIL/uL — ABNORMAL LOW (ref 3.87–5.11)
RDW: 14.3 % (ref 11.5–15.5)
WBC: 10.7 10*3/uL — ABNORMAL HIGH (ref 4.0–10.5)
nRBC: 0 % (ref 0.0–0.2)

## 2023-03-18 LAB — HCG, SERUM, QUALITATIVE: Preg, Serum: NEGATIVE

## 2023-03-18 MED ORDER — ONDANSETRON HCL 4 MG/2ML IJ SOLN
4.0000 mg | Freq: Once | INTRAMUSCULAR | Status: AC
  Administered 2023-03-18: 4 mg via INTRAVENOUS
  Filled 2023-03-18: qty 2

## 2023-03-18 MED ORDER — ACETAMINOPHEN 325 MG PO TABS
650.0000 mg | ORAL_TABLET | Freq: Once | ORAL | Status: AC
  Administered 2023-03-18: 650 mg via ORAL
  Filled 2023-03-18: qty 2

## 2023-03-18 MED ORDER — MORPHINE SULFATE (PF) 4 MG/ML IV SOLN
6.0000 mg | Freq: Once | INTRAVENOUS | Status: AC
  Administered 2023-03-18: 6 mg via INTRAVENOUS
  Filled 2023-03-18: qty 2

## 2023-03-18 MED ORDER — SODIUM CHLORIDE 0.9 % IV SOLN
500.0000 mg | Freq: Once | INTRAVENOUS | Status: AC
  Administered 2023-03-19: 500 mg via INTRAVENOUS
  Filled 2023-03-18: qty 5

## 2023-03-18 MED ORDER — ONDANSETRON HCL 4 MG/2ML IJ SOLN: 4.0000 mg | Freq: Once | INTRAMUSCULAR | Status: DC

## 2023-03-18 MED ORDER — IOHEXOL 300 MG/ML  SOLN
100.0000 mL | Freq: Once | INTRAMUSCULAR | Status: AC | PRN
  Administered 2023-03-18: 100 mL via INTRAVENOUS

## 2023-03-18 MED ORDER — SODIUM CHLORIDE 0.9 % IV SOLN
2.0000 g | Freq: Once | INTRAVENOUS | Status: AC
  Administered 2023-03-19: 2 g via INTRAVENOUS
  Filled 2023-03-18: qty 20

## 2023-03-18 NOTE — ED Triage Notes (Signed)
Patient referred to ED from CT due to right sided PNA on CT scan.  Pt reports right side rib pain x4 days with intermittent sob.

## 2023-03-18 NOTE — H&P (Incomplete)
History and Physical    Connie Wells ZOX:[id number] DOB: [birth year] DOA: 03/18/2023  PCP: Default, Provider, MD   Patient coming from: Home   Chief Complaint:  Chief Complaint  Patient presents with  . Abdominal Pain   ED TRIAGE note: Patient referred to ED from CT due to right sided PNA on CT scan.  Pt reports right side rib pain x4 days with intermittent sob.             HPI:  Connie Wells is a 38 y.o. female with medical history significant of breast cancer currently on Herceptin maintenance therapy, pulmonary nodule and liver lesion, generalized anxiety disorder presented to emergency department with complaining of right-sided rib pain for last 4 days with intermittent shortness of breath.  Patient reports that she is going to have right-sided lower chest pain for last 5 days.  The pain is initially tolerable now it becomes very severe.  Pain getting worse with movement, breathing, laughing and coughing.  Patient denies any fever at home.  Denies any productive phlegm production.  Denies any hemoptysis.  Denies any palpitation or lower extremity swelling.  Denies any abdominal pain constipation or diarrhea.   ED Course:  At presentation to ED patient found to have elevated temperature 102.9 Fahrenheit, tachycardic heart rate 126, respiratory 16 and hypotensive. Blood cultures are in process. Pending lactic acid level. CBC showed leukocytosis 10.7 and stable H&H. CMP showing low sodium 134, elevated creatinine 1.18 and low albumin 2.7.  Chest x-ray showed: IMPRESSION: 1. Dense consolidation within the right lung base, in keeping changes of acute lobar pneumonia in the appropriate clinical setting. Radiographic follow-up to resolution is recommended. 2. Suspected right hilar adenopathy.  CT chest: 1. New dense consolidation of much of the right lower lobe with some sparing in the superior segment but multi segmental involvement otherwise. Minimal  spillover ground-glass opacity into the right middle lobe. Appearance favors bacterial pneumonia pattern although pulmonary hemorrhage can have a similar appearance. We are attempting notification of the referring call service, and given the after hours timing, substantial right chest pain, and findings in the right lower lobe, technologist personnel are directing the patient to the emergency department. 2. Substantial improvement in the right breast lesions and right axillary adenopathy. 3. Mild density at the umbilicus is likely incidental, correlate with any tenderness along the umbilical region in assessing for omphalitis. 4. Absent right kidney with compensatory hypertrophy of the left kidney. 5. Stable scarring in the left kidney upper pole. 6. Left adrenal mass 1.7 by 2.0 cm, nonspecific but not hypermetabolic on 06/19/2022 hence most likely benign.   ED physician discussed case with radiology given CT chest mentioning pulmonary hemorrhage.  Per discussion given patient does not have any hemoptysis and clinical presentation is for pneumonia per radiology patient has pneumonia without then having a pulmonary hemorrhage.  Hospitalist has been contacted for further evaluation management of sepsis in the setting of pneumonia.  Significant labs in the ED:  Lab Orders         Blood Culture (routine x 2)         Comprehensive metabolic panel         CBC         hCG, serum, qualitative         Protime-INR         I-Stat Lactic Acid, ED       Review of Systems:  ROS  Past Medical History:  Diagnosis Date  . Herpes  genitalia   . Solitary kidney     Past Surgical History:  Procedure Laterality Date  . DILATION AND EVACUATION    . ECTOPIC PREGNANCY SURGERY       reports that she has been smoking cigarettes. She has a 2.5 pack-year smoking history. She has never used smokeless tobacco. She reports current drug use. Drug: Marijuana. She reports that she does not drink  alcohol.  No Known Allergies  Family History  Problem Relation Age of Onset  . Diabetes Mother   . Hypertension Mother   . Diabetes Maternal Aunt   . Asthma Brother     Prior to Admission medications   Medication Sig Start Date End Date Taking? Authorizing Provider  lidocaine-prilocaine (EMLA) cream Apply to affected area once Patient not taking: Reported on 01/21/2023 12/30/22   Serena Croissant, MD  LORazepam (ATIVAN) 1 MG tablet Take 1 mg by mouth every 8 (eight) hours. As needed    [provider]  losartan (COZAAR) 25 MG tablet Take 1 tablet (25 mg total) by mouth at bedtime. 07/03/22   Bensimhon, Bevelyn Buckles, MD  metoprolol succinate (TOPROL XL) 25 MG 24 hr tablet Take 1 tablet (25 mg total) by mouth daily. 07/03/22   Bensimhon, Bevelyn Buckles, MD     Physical Exam: Vitals:   03/18/23 2030 03/18/23 2045 03/18/23 2330 03/18/23 2339  BP: 92/61 108/76 110/70   Pulse:   (!) 126   Resp:   16   Temp:    (!) 102.9 F (39.4 C)  TempSrc:    Oral  SpO2:   97%   Weight:        Physical Exam   Labs on Admission: I have personally reviewed following labs and imaging studies  CBC: Recent Labs  Lab 03/18/23 1931  WBC 10.7*  HGB 11.4*  HCT 33.2*  MCV 94.6  PLT 266   Basic Metabolic Panel: Recent Labs  Lab 03/18/23 1931  NA 134*  K 3.6  CL 103  CO2 22  GLUCOSE 108*  BUN 20  CREATININE 1.18*  CALCIUM 8.4*   GFR: Estimated Creatinine Clearance: 56.9 mL/min (A) (by C-G formula based on SCr of 1.18 mg/dL (H)). Liver Function Tests: Recent Labs  Lab 03/18/23 1931  AST 20  ALT 15  ALKPHOS 62  BILITOT 0.7  PROT 8.1  ALBUMIN 2.7*   No results for input(s): "LIPASE", "AMYLASE" in the last 168 hours. No results for input(s): "AMMONIA" in the last 168 hours. Coagulation Profile: No results for input(s): "INR", "PROTIME" in the last 168 hours. Cardiac Enzymes: No results for input(s): "CKTOTAL", "CKMB", "CKMBINDEX", "TROPONINI", "TROPONINIHS" in the last 168  hours. BNP (last 3 results) No results for input(s): "BNP" in the last 8760 hours. HbA1C: No results for input(s): "HGBA1C" in the last 72 hours. CBG: No results for input(s): "GLUCAP" in the last 168 hours. Lipid Profile: No results for input(s): "CHOL", "HDL", "LDLCALC", "TRIG", "CHOLHDL", "LDLDIRECT" in the last 72 hours. Thyroid Function Tests: No results for input(s): "TSH", "T4TOTAL", "FREET4", "T3FREE", "THYROIDAB" in the last 72 hours. Anemia Panel: No results for input(s): "VITAMINB12", "FOLATE", "FERRITIN", "TIBC", "IRON", "RETICCTPCT" in the last 72 hours. Urine analysis:    Component Value Date/Time   COLORURINE YELLOW 12/21/2014 1502   APPEARANCEUR CLOUDY (A) 12/21/2014 1502   LABSPEC 1.007 12/21/2014 1502   PHURINE 7.0 12/21/2014 1502   GLUCOSEU NEGATIVE 12/21/2014 1502   HGBUR NEGATIVE 12/21/2014 1502   BILIRUBINUR NEGATIVE 12/21/2014 1502   KETONESUR NEGATIVE 12/21/2014 1502  PROTEINUR NEGATIVE 12/21/2014 1502   UROBILINOGEN 1.0 12/21/2014 1502   NITRITE NEGATIVE 12/21/2014 1502   LEUKOCYTESUR LARGE (A) 12/21/2014 1502    Radiological Exams on Admission: I have personally reviewed images DG Chest Port 1 View Result Date: 03/18/2023 CLINICAL DATA:  Pneumonia EXAM: PORTABLE CHEST 1 VIEW COMPARISON:  None Available. FINDINGS: There is dense consolidation within the right lung base, in keeping changes of acute lobar pneumonia in the appropriate clinical setting. Small right pleural effusion may be present. No pneumothorax. No pleural effusion on the left. Left lung is clear. Cardiac size within normal limits. Left internal jugular port tip seen within the superior vena cava. Pulmonary vascularity is normal. Suspected right hilar adenopathy. No acute bone abnormality. IMPRESSION: 1. Dense consolidation within the right lung base, in keeping changes of acute lobar pneumonia in the appropriate clinical setting. Radiographic follow-up to resolution is recommended. 2.  Suspected right hilar adenopathy. Electronically Signed   By: Helyn Numbers M.D.   On: 03/18/2023 22:09   CT CHEST ABDOMEN PELVIS W CONTRAST Result Date: 03/18/2023 CLINICAL DATA:  Breast cancer restaging.  Right chest pain. * Tracking Code: BO * EXAM: CT CHEST, ABDOMEN, AND PELVIS WITH CONTRAST TECHNIQUE: Multidetector CT imaging of the chest, abdomen and pelvis was performed following the standard protocol during bolus administration of intravenous contrast. RADIATION DOSE REDUCTION: This exam was performed according to the departmental dose-optimization program which includes automated exposure control, adjustment of the mA and/or kV according to patient size and/or use of iterative reconstruction technique. CONTRAST:  OMNIPAQUE IOHEXOL 300 MG/ML  SOLN COMPARISON:  11/27/2022 FINDINGS: CT CHEST FINDINGS Cardiovascular: Left Port-A-Cath tip: SVC. Mediastinum/Nodes: Clustered right axillary lymph nodes including an index node measuring 1.1 cm in short axis on image 11 series 2, improved from prior exam where this measured 1.7 cm. Small left axillary and bilateral subpectoral lymph nodes are present. Subglandular lesion in the right breast potentially with associated calcification measuring 2.1 by 1.2 cm on image 33 series 2, formerly 2.8 by 1.3 cm. The more medial solid breast nodule measures about 0.9 cm in diameter on image 37 series 2, previously 2.0 by 2.0 cm. This represents a substantial improvement. Lungs/Pleura: New dense consolidation of much of the right lower lobe with some sparing in the superior segment but multi segmental involvement otherwise. Minimal spillover ground-glass opacity into the right middle lobe for example on image 83 series 4. Appearance favors bacterial pneumonia pattern although pulmonary hemorrhage can have a similar appearance. Musculoskeletal: Unremarkable CT ABDOMEN PELVIS FINDINGS Hepatobiliary: Unremarkable Pancreas: Unremarkable Spleen: Unremarkable Adrenals/Urinary  Tract: Stable scarring in the left kidney upper pole. Absent right kidney with compensatory hypertrophy of the left kidney. Left adrenal mass 1.7 by 2.0 cm on image 54 series 2, nonspecific but not hypermetabolic on 06/19/2022 hence most likely benign. Stomach/Bowel: Unremarkable Vascular/Lymphatic: Unremarkable Reproductive: Retroverted uterus.  Otherwise unremarkable. Other: Mild density at the umbilicus is likely incidental, correlate with any tenderness along the umbilical region in assessing for omphalitis. Musculoskeletal: Unremarkable IMPRESSION: 1. New dense consolidation of much of the right lower lobe with some sparing in the superior segment but multi segmental involvement otherwise. Minimal spillover ground-glass opacity into the right middle lobe. Appearance favors bacterial pneumonia pattern although pulmonary hemorrhage can have a similar appearance. We are attempting notification of the referring call service, and given the after hours timing, substantial right chest pain, and findings in the right lower lobe, technologist personnel are directing the patient to the emergency department. 2. Substantial improvement  in the right breast lesions and right axillary adenopathy. 3. Mild density at the umbilicus is likely incidental, correlate with any tenderness along the umbilical region in assessing for omphalitis. 4. Absent right kidney with compensatory hypertrophy of the left kidney. 5. Stable scarring in the left kidney upper pole. 6. Left adrenal mass 1.7 by 2.0 cm, nonspecific but not hypermetabolic on 06/19/2022 hence most likely benign. Electronically Signed   By: Gaylyn Rong M.D.   On: 03/18/2023 17:17     EKG: My personal interpretation of EKG shows: ***    Assessment/Plan: Active Problems:   * No active hospital problems. *    Assessment and Plan: No notes have been filed under this hospital service. Service: Hospitalist      DVT prophylaxis:  {Blank  single:19197::"Lovenox","SQ Heparin","IV heparin gtts","Xarelto","Eliquis","Coumadin","SCDs","***"} Code Status:  {Blank single:19197::"Full Code","DNR with Intubation","DNR/DNI(Do NOT Intubate)","Comfort Care","***"} Diet:  Family Communication:  *** Family was present at bedside, at the time of interview.  Opportunity was given to ask question and all questions were answered satisfactorily.  Disposition Plan:  ***  Consults:  ***  Admission status:   {Blank single:19197::"Observation","Inpatient"}, {Blank single:19197::"Med-Surg","Telemetry bed","Step Down Unit"}  Severity of Illness: {Observation/Inpatient:21159}    Tereasa Coop, MD [facility]  How to contact the [facility] Attending or Consulting provider 7A - 7P or covering provider during after hours 7P -7A, for this patient.  Check the care team in [facility] and look for a) attending/consulting [facility] provider listed and b) the [facility] team listed Log into [email or web address] and use [facility] universal password to access. If you do not have the password, please contact the hospital operator. Locate the [facility] provider you are looking for under [facility] and page to a number that you can be directly reached. If you still have difficulty reaching the provider, please page the [facility] (Director on Call) for the Hospitalists listed on amion for assistance.  03/18/2023, 11:59 PM

## 2023-03-18 NOTE — H&P (Addendum)
History and Physical    Connie Wells ZDG:[id number] DOB: Jun 24, 1985 DOA: 03/18/2023  PCP: Default, Provider, MD   Patient coming from: Home   Chief Complaint:  Chief Complaint  Patient presents with   Abdominal Pain   ED TRIAGE note: Patient referred to ED from CT due to right sided PNA on CT scan.  Pt reports right side rib pain x4 days with intermittent sob.             HPI:  Connie Wells is a 38 y.o. female with medical history significant of breast cancer currently on Herceptin maintenance therapy, pulmonary nodule and liver lesion, generalized anxiety disorder presented to emergency department with complaining of right-sided rib pain for last 4 days with intermittent shortness of breath.  Patient reports that she is going to have right-sided lower chest pain for last 5 days.  The pain is initially tolerable now it becomes very severe.  Pain getting worse with movement, breathing, laughing and coughing.  Patient denies any fever at home.  Denies any productive phlegm production.  Denies any hemoptysis.  Denies any palpitation or lower extremity swelling.  Denies any abdominal pain constipation or diarrhea. During my evaluation at the bedside patient is complaining about significant right-sided musculoskeletal chest pain she is limiting her movement, coughing, and daily activities.   ED Course:  At presentation to ED patient found to have elevated temperature 102.9 Fahrenheit, tachycardic heart rate 126, respiratory 16 and hypotensive. Blood cultures are in process. Pending lactic acid level. CBC showed leukocytosis 10.7 and stable H&H. CMP showing low sodium 134, elevated creatinine 1.18 and low albumin 2.7.  Chest x-ray showed: IMPRESSION: 1. Dense consolidation within the right lung base, in keeping changes of acute lobar pneumonia in the appropriate clinical setting. Radiographic follow-up to resolution is recommended. 2. Suspected right hilar  adenopathy.  CT chest: 1. New dense consolidation of much of the right lower lobe with some sparing in the superior segment but multi segmental involvement otherwise. Minimal spillover ground-glass opacity into the right middle lobe. Appearance favors bacterial pneumonia pattern although pulmonary hemorrhage can have a similar appearance. We are attempting notification of the referring call service, and given the after hours timing, substantial right chest pain, and findings in the right lower lobe, technologist personnel are directing the patient to the emergency department. 2. Substantial improvement in the right breast lesions and right axillary adenopathy. 3. Mild density at the umbilicus is likely incidental, correlate with any tenderness along the umbilical region in assessing for omphalitis. 4. Absent right kidney with compensatory hypertrophy of the left kidney. 5. Stable scarring in the left kidney upper pole. 6. Left adrenal mass 1.7 by 2.0 cm, nonspecific but not hypermetabolic on 06/19/2022 hence most likely benign.   ED physician discussed case with radiology given CT chest mentioning pulmonary hemorrhage.  Per discussion given patient does not have any hemoptysis and clinical presentation is for pneumonia per radiology patient has pneumonia without then having a pulmonary hemorrhage.  In the ED patient has been treated with ceftriaxone 2 g and azithromycin 500 mg.  Patient also received morphine 6 mg and Zofran 4 mg.  Hospitalist has been contacted for further evaluation management of sepsis in the setting of pneumonia.  Significant labs in the ED: Lab Orders         Blood Culture (routine x 2)         Expectorated Sputum Assessment w Gram Stain, Rflx to Resp Cult  Respiratory (~20 pathogens) panel by PCR         Comprehensive metabolic panel         CBC         hCG, serum, qualitative         Protime-INR         Legionella Pneumophila Serogp 1 Ur Ag          Strep pneumoniae urinary antigen         CBC         Comprehensive metabolic panel         Protime-INR         APTT         Urinalysis, Routine w reflex microscopic -Urine, Clean Catch         Sodium, urine, random         Creatinine, urine, random         HIV Antibody (routine testing w rflx)         I-Stat Lactic Acid, ED       Review of Systems:  Review of Systems  Constitutional:  Negative for chills, fever, malaise/fatigue and weight loss.  Respiratory:  Positive for shortness of breath. Negative for cough, sputum production and wheezing.   Cardiovascular:  Positive for chest pain. Negative for palpitations, orthopnea and leg swelling.  Gastrointestinal:  Negative for abdominal pain, heartburn, nausea and vomiting.  Musculoskeletal:  Negative for back pain, falls, joint pain, myalgias and neck pain.  Neurological:  Negative for dizziness and headaches.  Endo/Heme/Allergies:  Negative for environmental allergies. Does not bruise/bleed easily.  Psychiatric/Behavioral:  The patient is not nervous/anxious.     Past Medical History:  Diagnosis Date   Herpes genitalia    Solitary kidney     Past Surgical History:  Procedure Laterality Date   DILATION AND EVACUATION     ECTOPIC PREGNANCY SURGERY       reports that she has been smoking cigarettes. She has a 2.5 pack-year smoking history. She has never used smokeless tobacco. She reports current drug use. Drug: Marijuana. She reports that she does not drink alcohol.  No Known Allergies  Family History  Problem Relation Age of Onset   Diabetes Mother    Hypertension Mother    Diabetes Maternal Aunt    Asthma Brother     Prior to Admission medications   Medication Sig Start Date End Date Taking? Authorizing Provider  lidocaine-prilocaine (EMLA) cream Apply to affected area once Patient not taking: Reported on 01/21/2023 12/30/22   Serena Croissant, MD  LORazepam (ATIVAN) 1 MG tablet Take 1 mg by mouth every 8 (eight)  hours. As needed    [provider]  losartan (COZAAR) 25 MG tablet Take 1 tablet (25 mg total) by mouth at bedtime. 07/03/22   Bensimhon, Bevelyn Buckles, MD  metoprolol succinate (TOPROL XL) 25 MG 24 hr tablet Take 1 tablet (25 mg total) by mouth daily. 07/03/22   Bensimhon, Bevelyn Buckles, MD     Physical Exam: Vitals:   03/19/23 0030 03/19/23 0045 03/19/23 0100 03/19/23 0106  BP: 104/67 102/65 101/60   Pulse:  (!) 120 (!) 117   Resp:   (!) 29   Temp:    (!) 101.9 F (38.8 C)  TempSrc:    Oral  SpO2:  96% 95%   Weight:        Physical Exam Vitals and nursing note reviewed.  Constitutional:      Appearance: She is ill-appearing.  HENT:  Mouth/Throat:     Mouth: Mucous membranes are moist.  Cardiovascular:     Rate and Rhythm: Normal rate and regular rhythm.  Pulmonary:     Effort: Pulmonary effort is normal. No respiratory distress.     Breath sounds: Rhonchi present. No wheezing or rales.  Abdominal:     General: Bowel sounds are normal.     Palpations: Abdomen is soft.     Tenderness: There is no abdominal tenderness.  Skin:    General: Skin is warm.     Capillary Refill: Capillary refill takes less than 2 seconds.  Neurological:     Mental Status: She is alert and oriented to person, place, and time.  Psychiatric:        Mood and Affect: Mood normal. Mood is not anxious or depressed.      Labs on Admission: I have personally reviewed following labs and imaging studies  CBC: Recent Labs  Lab 03/18/23 1931  WBC 10.7*  HGB 11.4*  HCT 33.2*  MCV 94.6  PLT 266   Basic Metabolic Panel: Recent Labs  Lab 03/18/23 1931  NA 134*  K 3.6  CL 103  CO2 22  GLUCOSE 108*  BUN 20  CREATININE 1.18*  CALCIUM 8.4*   GFR: Estimated Creatinine Clearance: 56.9 mL/min (A) (by C-G formula based on SCr of 1.18 mg/dL (H)). Liver Function Tests: Recent Labs  Lab 03/18/23 1931  AST 20  ALT 15  ALKPHOS 62  BILITOT 0.7  PROT 8.1  ALBUMIN 2.7*   No results for  input(s): "LIPASE", "AMYLASE" in the last 168 hours. No results for input(s): "AMMONIA" in the last 168 hours. Coagulation Profile: Recent Labs  Lab 03/19/23 0018  INR 1.1   Cardiac Enzymes: No results for input(s): "CKTOTAL", "CKMB", "CKMBINDEX", "TROPONINI", "TROPONINIHS" in the last 168 hours. BNP (last 3 results) No results for input(s): "BNP" in the last 8760 hours. HbA1C: No results for input(s): "HGBA1C" in the last 72 hours. CBG: No results for input(s): "GLUCAP" in the last 168 hours. Lipid Profile: No results for input(s): "CHOL", "HDL", "LDLCALC", "TRIG", "CHOLHDL", "LDLDIRECT" in the last 72 hours. Thyroid Function Tests: No results for input(s): "TSH", "T4TOTAL", "FREET4", "T3FREE", "THYROIDAB" in the last 72 hours. Anemia Panel: No results for input(s): "VITAMINB12", "FOLATE", "FERRITIN", "TIBC", "IRON", "RETICCTPCT" in the last 72 hours. Urine analysis:    Component Value Date/Time   COLORURINE YELLOW 03/19/2023 0037   APPEARANCEUR CLEAR 03/19/2023 0037   LABSPEC 1.005 03/19/2023 0037   PHURINE 5.0 03/19/2023 0037   GLUCOSEU NEGATIVE 03/19/2023 0037   HGBUR SMALL (A) 03/19/2023 0037   BILIRUBINUR NEGATIVE 03/19/2023 0037   KETONESUR NEGATIVE 03/19/2023 0037   PROTEINUR 30 (A) 03/19/2023 0037   UROBILINOGEN 1.0 12/21/2014 1502   NITRITE NEGATIVE 03/19/2023 0037   LEUKOCYTESUR NEGATIVE 03/19/2023 0037    Radiological Exams on Admission: I have personally reviewed images DG Chest Port 1 View Result Date: 03/18/2023 CLINICAL DATA:  Pneumonia EXAM: PORTABLE CHEST 1 VIEW COMPARISON:  None Available. FINDINGS: There is dense consolidation within the right lung base, in keeping changes of acute lobar pneumonia in the appropriate clinical setting. Small right pleural effusion may be present. No pneumothorax. No pleural effusion on the left. Left lung is clear. Cardiac size within normal limits. Left internal jugular port tip seen within the superior vena cava. Pulmonary  vascularity is normal. Suspected right hilar adenopathy. No acute bone abnormality. IMPRESSION: 1. Dense consolidation within the right lung base, in keeping changes of acute lobar  pneumonia in the appropriate clinical setting. Radiographic follow-up to resolution is recommended. 2. Suspected right hilar adenopathy. Electronically Signed   By: Helyn Numbers M.D.   On: 03/18/2023 22:09   CT CHEST ABDOMEN PELVIS W CONTRAST Result Date: 03/18/2023 CLINICAL DATA:  Breast cancer restaging.  Right chest pain. * Tracking Code: BO * EXAM: CT CHEST, ABDOMEN, AND PELVIS WITH CONTRAST TECHNIQUE: Multidetector CT imaging of the chest, abdomen and pelvis was performed following the standard protocol during bolus administration of intravenous contrast. RADIATION DOSE REDUCTION: This exam was performed according to the departmental dose-optimization program which includes automated exposure control, adjustment of the mA and/or kV according to patient size and/or use of iterative reconstruction technique. CONTRAST:  OMNIPAQUE IOHEXOL 300 MG/ML  SOLN COMPARISON:  11/27/2022 FINDINGS: CT CHEST FINDINGS Cardiovascular: Left Port-A-Cath tip: SVC. Mediastinum/Nodes: Clustered right axillary lymph nodes including an index node measuring 1.1 cm in short axis on image 11 series 2, improved from prior exam where this measured 1.7 cm. Small left axillary and bilateral subpectoral lymph nodes are present. Subglandular lesion in the right breast potentially with associated calcification measuring 2.1 by 1.2 cm on image 33 series 2, formerly 2.8 by 1.3 cm. The more medial solid breast nodule measures about 0.9 cm in diameter on image 37 series 2, previously 2.0 by 2.0 cm. This represents a substantial improvement. Lungs/Pleura: New dense consolidation of much of the right lower lobe with some sparing in the superior segment but multi segmental involvement otherwise. Minimal spillover ground-glass opacity into the right middle lobe for  example on image 83 series 4. Appearance favors bacterial pneumonia pattern although pulmonary hemorrhage can have a similar appearance. Musculoskeletal: Unremarkable CT ABDOMEN PELVIS FINDINGS Hepatobiliary: Unremarkable Pancreas: Unremarkable Spleen: Unremarkable Adrenals/Urinary Tract: Stable scarring in the left kidney upper pole. Absent right kidney with compensatory hypertrophy of the left kidney. Left adrenal mass 1.7 by 2.0 cm on image 54 series 2, nonspecific but not hypermetabolic on 06/19/2022 hence most likely benign. Stomach/Bowel: Unremarkable Vascular/Lymphatic: Unremarkable Reproductive: Retroverted uterus.  Otherwise unremarkable. Other: Mild density at the umbilicus is likely incidental, correlate with any tenderness along the umbilical region in assessing for omphalitis. Musculoskeletal: Unremarkable IMPRESSION: 1. New dense consolidation of much of the right lower lobe with some sparing in the superior segment but multi segmental involvement otherwise. Minimal spillover ground-glass opacity into the right middle lobe. Appearance favors bacterial pneumonia pattern although pulmonary hemorrhage can have a similar appearance. We are attempting notification of the referring call service, and given the after hours timing, substantial right chest pain, and findings in the right lower lobe, technologist personnel are directing the patient to the emergency department. 2. Substantial improvement in the right breast lesions and right axillary adenopathy. 3. Mild density at the umbilicus is likely incidental, correlate with any tenderness along the umbilical region in assessing for omphalitis. 4. Absent right kidney with compensatory hypertrophy of the left kidney. 5. Stable scarring in the left kidney upper pole. 6. Left adrenal mass 1.7 by 2.0 cm, nonspecific but not hypermetabolic on 06/19/2022 hence most likely benign. Electronically Signed   By: Gaylyn Rong M.D.   On: 03/18/2023 17:17       Assessment/Plan: Principal Problem:   Sepsis due to pneumonia [facility]) Active Problems:   History of breast cancer   AKI (acute kidney injury) (HCC)   GAD (generalized anxiety disorder)   CAP (community acquired pneumonia)    Assessment and Plan: Sepsis secondary to pneumonia -Patient presented to emergency department the  setting right sided chest wall pain fall last 5 days which is not improving with Tylenol at home.  Patient denies any fever, productive cough, shortness of breath, nausea, abdominal pain, vomiting and diarrhea. - Presentation to ED patient found tachycardic, febrile 102 F, and borderline hypotensive.  O2 sat 99 to 97% on room air.  CBC showing mild leukocytosis 10.7 and stable H&H.  Lactic acid within normal range. -Patient had a CT chest abdomen pelvis at the oncology clinic today which showed concern for pneumonia and patient was referred to ED for evaluation -CT chest concern for pneumonia versus hemorrhage.  Given patient does not have any hemoptysis and per discussion between ED physician and radiologist given patient does not have any hemoptysis per radiology CT chest is more relevant for pneumonia. -Chest x-ray showed dense consolidation within the right lung base.  Radiology follow-up to resolution is recommended -In the ED patient has been treated with ceftriaxone 2 g, azithromycin 500 mg. -Given patient has leukocytosis, tachycardia, febrile and have a source of infection she meets sepsis criteria. - Pending procalcitonin level. -Plan to continue IV ceftriaxone 2 g daily and IV doxycycline milligram twice daily - Will follow-up with blood cultures, sputum cultures, urine Legionella, urine strep and respiratory panel. - Continue NS 125 cc/h for 24 hours. -Continue supportive care.   Right-sided chest wall pain secondary to pneumonia - Continue pain control with Tylenol, Norco moderate pain and morphine severe pain as needed.   Acute kidney injury -CT  abdomen pelvis showed absent right kidney with compensatory hypertrophy of the left kidney. - Creatinine 1.18.  Baseline renal function within normal range.  Prerenal acute kidney injury in the setting of sepsis.  Continue maintenance fluid.  Checking UA, urine sodium and creatinine.  Avoid nephrotoxic agent renally dose medications -Continue NS 125 cc/h for 24 hours. -Continue to monitor for improvement of renal function.   Generalized anxiety disorder Continue lorazepam 1 mg every 8 hours as needed for anxiety  Metastatic breast cancer - Patient has breast cancer with metastasis to multiple side currently on maintenance therapy with Herceptin and Perjeta. -Added Dr. Pamelia Hoit on the treatment team.    DVT prophylaxis:  SQ Heparin Code Status:  Full Code Diet: Regular diet Family Communication: None present Disposition Plan: Will follow-up with blood culture, sputum cultures result for further antibiotic guidance. Consults: None at this time Admission status:   Inpatient, progressive unit  Severity of Illness: The appropriate patient status for this patient is INPATIENT. Inpatient status is judged to be reasonable and necessary in order to provide the required intensity of service to ensure the patient's safety. The patient's presenting symptoms, physical exam findings, and initial radiographic and laboratory data in the context of their chronic comorbidities is felt to place them at high risk for further clinical deterioration. Furthermore, it is not anticipated that the patient will be medically stable for discharge from the hospital within 2 midnights of admission.   * I certify that at the point of admission it is my clinical judgment that the patient will require inpatient hospital care spanning beyond 2 midnights from the point of admission due to high intensity of service, high risk for further deterioration and high frequency of surveillance required.Marland Kitchen    Tereasa Coop, MD [facility]  How to contact the [facility] Attending or Consulting provider 7A - 7P or covering provider during after hours 7P -7A, for this patient.  Check the care team in [facility] and look for a) attending/consulting [facility]  provider listed and b) the [facility] team listed Log into [email or web address] and use [place] universal password to access. If you do not have the password, please contact the hospital operator. Locate the [facility] provider you are looking for under [facility] and page to a number that you can be directly reached. If you still have difficulty reaching the provider, please page the [facility] (Director on Call) for the Hospitalists listed on amion for assistance.  03/19/2023, 1:13 AM

## 2023-03-18 NOTE — ED Provider Notes (Incomplete)
[place] EMERGENCY DEPARTMENT AT [facility] Provider Note   CSN: [id number] Arrival date & time: 03/18/23  1730     History {Add pertinent medical, surgical, social history, OB history to HPI:1} Chief Complaint  Patient presents with  . Abdominal Pain    Connie Wells is a 38 y.o. female.  HPI     38 year old female comes in with chief complaint of right-sided chest pain. Patient has known history of metastatic breast cancer.  She has been having right-sided lower chest pain for the last 5 days.  The pain was initially tolerable, but now becomes severe.  She has pain with any kind of movement, breathing, laughing, coughing.  She is not really producing any phlegm.  Patient denies any hemoptysis.  Review of system negative for fevers, chills.  She had gone for CT scan today for surveillance and was advised that she has a large infection, that she needs to come to the ER.  Home Medications Prior to Admission medications   Medication Sig Start Date End Date Taking? Authorizing Provider  lidocaine-prilocaine (EMLA) cream Apply to affected area once Patient not taking: Reported on 01/21/2023 12/30/22   Serena Croissant, MD  LORazepam (ATIVAN) 1 MG tablet Take 1 mg by mouth every 8 (eight) hours. As needed    [provider]  losartan (COZAAR) 25 MG tablet Take 1 tablet (25 mg total) by mouth at bedtime. 07/03/22   Bensimhon, Bevelyn Buckles, MD  metoprolol succinate (TOPROL XL) 25 MG 24 hr tablet Take 1 tablet (25 mg total) by mouth daily. 07/03/22   Bensimhon, Bevelyn Buckles, MD      Allergies    Patient has no known allergies.    Review of Systems   Review of Systems  All other systems reviewed and are negative.   Physical Exam Updated Vital Signs BP 110/70   Pulse (!) 126   Temp (!) 102.9 F (39.4 C) (Oral) Comment: RN aware  Resp 16   Wt 56 kg   SpO2 97%   BMI 21.19 kg/m  Physical Exam Vitals and nursing note reviewed.  Constitutional:      Appearance:  She is well-developed.  HENT:     Head: Atraumatic.  Cardiovascular:     Rate and Rhythm: Normal rate.  Pulmonary:     Effort: Pulmonary effort is normal.     Comments: Diminished breath sounds on the right side Musculoskeletal:     Cervical back: Normal range of motion and neck supple.  Skin:    General: Skin is warm and dry.  Neurological:     Mental Status: She is alert and oriented to person, place, and time.     ED Results / Procedures / Treatments   Labs (all labs ordered are listed, but only abnormal results are displayed) Labs Reviewed  COMPREHENSIVE METABOLIC PANEL - Abnormal; Notable for the following components:      Result Value   Sodium 134 (*)    Glucose, Bld 108 (*)    Creatinine, Ser 1.18 (*)    Calcium 8.4 (*)    Albumin 2.7 (*)    All other components within normal limits  CBC - Abnormal; Notable for the following components:   WBC 10.7 (*)    RBC 3.51 (*)    Hemoglobin 11.4 (*)    HCT 33.2 (*)    All other components within normal limits  CULTURE, BLOOD (ROUTINE X 2)  CULTURE, BLOOD (ROUTINE X 2)  HCG, SERUM, QUALITATIVE  PROTIME-INR  I-STAT CG4 LACTIC ACID, ED    EKG None  Radiology DG Chest Port 1 View Result Date: 03/18/2023 CLINICAL DATA:  Pneumonia EXAM: PORTABLE CHEST 1 VIEW COMPARISON:  None Available. FINDINGS: There is dense consolidation within the right lung base, in keeping changes of acute lobar pneumonia in the appropriate clinical setting. Small right pleural effusion may be present. No pneumothorax. No pleural effusion on the left. Left lung is clear. Cardiac size within normal limits. Left internal jugular port tip seen within the superior vena cava. Pulmonary vascularity is normal. Suspected right hilar adenopathy. No acute bone abnormality. IMPRESSION: 1. Dense consolidation within the right lung base, in keeping changes of acute lobar pneumonia in the appropriate clinical setting. Radiographic follow-up to resolution is  recommended. 2. Suspected right hilar adenopathy. Electronically Signed   By: Helyn Numbers M.D.   On: 03/18/2023 22:09   CT CHEST ABDOMEN PELVIS W CONTRAST Result Date: 03/18/2023 CLINICAL DATA:  Breast cancer restaging.  Right chest pain. * Tracking Code: BO * EXAM: CT CHEST, ABDOMEN, AND PELVIS WITH CONTRAST TECHNIQUE: Multidetector CT imaging of the chest, abdomen and pelvis was performed following the standard protocol during bolus administration of intravenous contrast. RADIATION DOSE REDUCTION: This exam was performed according to the departmental dose-optimization program which includes automated exposure control, adjustment of the mA and/or kV according to patient size and/or use of iterative reconstruction technique. CONTRAST:  OMNIPAQUE IOHEXOL 300 MG/ML  SOLN COMPARISON:  11/27/2022 FINDINGS: CT CHEST FINDINGS Cardiovascular: Left Port-A-Cath tip: SVC. Mediastinum/Nodes: Clustered right axillary lymph nodes including an index node measuring 1.1 cm in short axis on image 11 series 2, improved from prior exam where this measured 1.7 cm. Small left axillary and bilateral subpectoral lymph nodes are present. Subglandular lesion in the right breast potentially with associated calcification measuring 2.1 by 1.2 cm on image 33 series 2, formerly 2.8 by 1.3 cm. The more medial solid breast nodule measures about 0.9 cm in diameter on image 37 series 2, previously 2.0 by 2.0 cm. This represents a substantial improvement. Lungs/Pleura: New dense consolidation of much of the right lower lobe with some sparing in the superior segment but multi segmental involvement otherwise. Minimal spillover ground-glass opacity into the right middle lobe for example on image 83 series 4. Appearance favors bacterial pneumonia pattern although pulmonary hemorrhage can have a similar appearance. Musculoskeletal: Unremarkable CT ABDOMEN PELVIS FINDINGS Hepatobiliary: Unremarkable Pancreas: Unremarkable Spleen: Unremarkable  Adrenals/Urinary Tract: Stable scarring in the left kidney upper pole. Absent right kidney with compensatory hypertrophy of the left kidney. Left adrenal mass 1.7 by 2.0 cm on image 54 series 2, nonspecific but not hypermetabolic on 06/19/2022 hence most likely benign. Stomach/Bowel: Unremarkable Vascular/Lymphatic: Unremarkable Reproductive: Retroverted uterus.  Otherwise unremarkable. Other: Mild density at the umbilicus is likely incidental, correlate with any tenderness along the umbilical region in assessing for omphalitis. Musculoskeletal: Unremarkable IMPRESSION: 1. New dense consolidation of much of the right lower lobe with some sparing in the superior segment but multi segmental involvement otherwise. Minimal spillover ground-glass opacity into the right middle lobe. Appearance favors bacterial pneumonia pattern although pulmonary hemorrhage can have a similar appearance. We are attempting notification of the referring call service, and given the after hours timing, substantial right chest pain, and findings in the right lower lobe, technologist personnel are directing the patient to the emergency department. 2. Substantial improvement in the right breast lesions and right axillary adenopathy. 3. Mild density at the umbilicus is likely incidental, correlate with any  tenderness along the umbilical region in assessing for omphalitis. 4. Absent right kidney with compensatory hypertrophy of the left kidney. 5. Stable scarring in the left kidney upper pole. 6. Left adrenal mass 1.7 by 2.0 cm, nonspecific but not hypermetabolic on 06/19/2022 hence most likely benign. Electronically Signed   By: Gaylyn Rong M.D.   On: 03/18/2023 17:17    Procedures Procedures  {Document cardiac monitor, telemetry assessment procedure when appropriate:1}  Medications Ordered in ED Medications  ondansetron (ZOFRAN) injection 4 mg (0 mg Intravenous Hold 03/18/23 2343)  cefTRIAXone (ROCEPHIN) 2 g in sodium chloride  0.9 % 100 mL IVPB (has no administration in time range)  azithromycin (ZITHROMAX) 500 mg in sodium chloride 0.9 % 250 mL IVPB (has no administration in time range)  morphine (PF) 4 MG/ML injection 6 mg (6 mg Intravenous Given 03/18/23 2329)  ondansetron (ZOFRAN) injection 4 mg (4 mg Intravenous Given 03/18/23 2339)  acetaminophen (TYLENOL) tablet 650 mg (650 mg Oral Given 03/18/23 2359)    ED Course/ Medical Decision Making/ A&P   {   Click here for ABCD2, HEART and other calculatorsREFRESH Note before signing :1}                              Medical Decision Making Amount and/or Complexity of Data Reviewed Labs: ordered. Radiology: ordered.  Risk OTC drugs. Prescription drug management.   This patient presents to the ED with chief complaint(s) of chest pain, lower, R side with pertinent past medical history of  .The complaint involves an extensive differential diagnosis and also carries with it a high risk of complications and morbidity.    The differential diagnosis includes ***   The initial plan is to ***   Additional history obtained: Additional history obtained from {additional history:26846} Records reviewed {records:26847}  Independent labs interpretation:  The following labs were independently interpreted: ***  Independent visualization and interpretation of imaging: - I independently visualized the following imaging with scope of interpretation limited to determining acute life threatening conditions related to emergency care: ***, which revealed ***  Treatment and Reassessment: ***  Consultation: - Consulted or discussed management/test interpretation with external professional: ***  Consideration for admission or further workup:  Social Determinants of health:   Final Clinical Impression(s) / ED Diagnoses Final diagnoses:  Community acquired pneumonia of right lower lobe of lung    Rx / DC Orders ED Discharge Orders     None

## 2023-03-18 NOTE — ED Provider Notes (Signed)
[place] EMERGENCY DEPARTMENT AT [facility] Provider Note   CSN: [id number] Arrival date & time: 03/18/23  1730     History  Chief Complaint  Patient presents with   Abdominal Pain    Connie Wells is a 38 y.o. female.  HPI     38 year old female comes in with chief complaint of right-sided chest pain. Patient has known history of metastatic breast cancer.  She has been having right-sided lower chest pain for the last 5 days.  The pain was initially tolerable, but now becomes severe.  She has pain with any kind of movement, breathing, laughing, coughing.  She is not really producing any phlegm.  Patient denies any hemoptysis.  Review of system negative for fevers, chills.  She had gone for CT scan today for surveillance and was advised that she has a large infection, that she needs to come to the ER.  Home Medications Prior to Admission medications   Medication Sig Start Date End Date Taking? Authorizing Provider  lidocaine-prilocaine (EMLA) cream Apply to affected area once Patient not taking: Reported on 01/21/2023 12/30/22   Serena Croissant, MD  LORazepam (ATIVAN) 1 MG tablet Take 1 mg by mouth every 8 (eight) hours. As needed    [provider]  losartan (COZAAR) 25 MG tablet Take 1 tablet (25 mg total) by mouth at bedtime. 07/03/22   Bensimhon, Bevelyn Buckles, MD  metoprolol succinate (TOPROL XL) 25 MG 24 hr tablet Take 1 tablet (25 mg total) by mouth daily. 07/03/22   Bensimhon, Bevelyn Buckles, MD      Allergies    Patient has no known allergies.    Review of Systems   Review of Systems  All other systems reviewed and are negative.   Physical Exam Updated Vital Signs BP 110/70   Pulse (!) 126   Temp (!) 102.9 F (39.4 C) (Oral) Comment: RN aware  Resp 16   Wt 56 kg   SpO2 97%   BMI 21.19 kg/m  Physical Exam Vitals and nursing note reviewed.  Constitutional:      Appearance: She is well-developed.  HENT:     Head: Atraumatic.   Cardiovascular:     Rate and Rhythm: Normal rate.  Pulmonary:     Effort: Pulmonary effort is normal.     Comments: Diminished breath sounds on the right side Musculoskeletal:     Cervical back: Normal range of motion and neck supple.  Skin:    General: Skin is warm and dry.  Neurological:     Mental Status: She is alert and oriented to person, place, and time.     ED Results / Procedures / Treatments   Labs (all labs ordered are listed, but only abnormal results are displayed) Labs Reviewed  COMPREHENSIVE METABOLIC PANEL - Abnormal; Notable for the following components:      Result Value   Sodium 134 (*)    Glucose, Bld 108 (*)    Creatinine, Ser 1.18 (*)    Calcium 8.4 (*)    Albumin 2.7 (*)    All other components within normal limits  CBC - Abnormal; Notable for the following components:   WBC 10.7 (*)    RBC 3.51 (*)    Hemoglobin 11.4 (*)    HCT 33.2 (*)    All other components within normal limits  CULTURE, BLOOD (ROUTINE X 2)  CULTURE, BLOOD (ROUTINE X 2)  EXPECTORATED SPUTUM ASSESSMENT W GRAM STAIN, RFLX TO RESP C  RESPIRATORY PANEL  BY PCR  HCG, SERUM, QUALITATIVE  PROTIME-INR  LEGIONELLA PNEUMOPHILA SEROGP 1 UR AG  STREP PNEUMONIAE URINARY ANTIGEN  CBC  COMPREHENSIVE METABOLIC PANEL  PROTIME-INR  APTT  URINALYSIS, ROUTINE W REFLEX MICROSCOPIC  SODIUM, URINE, RANDOM  CREATININE, URINE, RANDOM  HIV ANTIBODY (ROUTINE TESTING W REFLEX)  I-STAT CG4 LACTIC ACID, ED    EKG None  Radiology DG Chest Port 1 View Result Date: 03/18/2023 CLINICAL DATA:  Pneumonia EXAM: PORTABLE CHEST 1 VIEW COMPARISON:  None Available. FINDINGS: There is dense consolidation within the right lung base, in keeping changes of acute lobar pneumonia in the appropriate clinical setting. Small right pleural effusion may be present. No pneumothorax. No pleural effusion on the left. Left lung is clear. Cardiac size within normal limits. Left internal jugular port tip seen within the  superior vena cava. Pulmonary vascularity is normal. Suspected right hilar adenopathy. No acute bone abnormality. IMPRESSION: 1. Dense consolidation within the right lung base, in keeping changes of acute lobar pneumonia in the appropriate clinical setting. Radiographic follow-up to resolution is recommended. 2. Suspected right hilar adenopathy. Electronically Signed   By: Helyn Numbers M.D.   On: 03/18/2023 22:09   CT CHEST ABDOMEN PELVIS W CONTRAST Result Date: 03/18/2023 CLINICAL DATA:  Breast cancer restaging.  Right chest pain. * Tracking Code: BO * EXAM: CT CHEST, ABDOMEN, AND PELVIS WITH CONTRAST TECHNIQUE: Multidetector CT imaging of the chest, abdomen and pelvis was performed following the standard protocol during bolus administration of intravenous contrast. RADIATION DOSE REDUCTION: This exam was performed according to the departmental dose-optimization program which includes automated exposure control, adjustment of the mA and/or kV according to patient size and/or use of iterative reconstruction technique. CONTRAST:  OMNIPAQUE IOHEXOL 300 MG/ML  SOLN COMPARISON:  11/27/2022 FINDINGS: CT CHEST FINDINGS Cardiovascular: Left Port-A-Cath tip: SVC. Mediastinum/Nodes: Clustered right axillary lymph nodes including an index node measuring 1.1 cm in short axis on image 11 series 2, improved from prior exam where this measured 1.7 cm. Small left axillary and bilateral subpectoral lymph nodes are present. Subglandular lesion in the right breast potentially with associated calcification measuring 2.1 by 1.2 cm on image 33 series 2, formerly 2.8 by 1.3 cm. The more medial solid breast nodule measures about 0.9 cm in diameter on image 37 series 2, previously 2.0 by 2.0 cm. This represents a substantial improvement. Lungs/Pleura: New dense consolidation of much of the right lower lobe with some sparing in the superior segment but multi segmental involvement otherwise. Minimal spillover ground-glass opacity  into the right middle lobe for example on image 83 series 4. Appearance favors bacterial pneumonia pattern although pulmonary hemorrhage can have a similar appearance. Musculoskeletal: Unremarkable CT ABDOMEN PELVIS FINDINGS Hepatobiliary: Unremarkable Pancreas: Unremarkable Spleen: Unremarkable Adrenals/Urinary Tract: Stable scarring in the left kidney upper pole. Absent right kidney with compensatory hypertrophy of the left kidney. Left adrenal mass 1.7 by 2.0 cm on image 54 series 2, nonspecific but not hypermetabolic on 06/19/2022 hence most likely benign. Stomach/Bowel: Unremarkable Vascular/Lymphatic: Unremarkable Reproductive: Retroverted uterus.  Otherwise unremarkable. Other: Mild density at the umbilicus is likely incidental, correlate with any tenderness along the umbilical region in assessing for omphalitis. Musculoskeletal: Unremarkable IMPRESSION: 1. New dense consolidation of much of the right lower lobe with some sparing in the superior segment but multi segmental involvement otherwise. Minimal spillover ground-glass opacity into the right middle lobe. Appearance favors bacterial pneumonia pattern although pulmonary hemorrhage can have a similar appearance. We are attempting notification of the referring call service, and  given the after hours timing, substantial right chest pain, and findings in the right lower lobe, technologist personnel are directing the patient to the emergency department. 2. Substantial improvement in the right breast lesions and right axillary adenopathy. 3. Mild density at the umbilicus is likely incidental, correlate with any tenderness along the umbilical region in assessing for omphalitis. 4. Absent right kidney with compensatory hypertrophy of the left kidney. 5. Stable scarring in the left kidney upper pole. 6. Left adrenal mass 1.7 by 2.0 cm, nonspecific but not hypermetabolic on 06/19/2022 hence most likely benign. Electronically Signed   By: Gaylyn Rong M.D.    On: 03/18/2023 17:17    Procedures Procedures    Medications Ordered in ED Medications  ondansetron (ZOFRAN) injection 4 mg (0 mg Intravenous Hold 03/18/23 2343)  azithromycin (ZITHROMAX) 500 mg in sodium chloride 0.9 % 250 mL IVPB (500 mg Intravenous New Bag/Given 03/19/23 0016)  LORazepam (ATIVAN) tablet 1 mg (has no administration in time range)  heparin injection 5,000 Units (has no administration in time range)  cefTRIAXone (ROCEPHIN) 2 g in sodium chloride 0.9 % 100 mL IVPB (has no administration in time range)  doxycycline (VIBRAMYCIN) 100 mg in sodium chloride 0.9 % 250 mL IVPB (has no administration in time range)  sodium chloride flush (NS) 0.9 % injection 3 mL (has no administration in time range)  sodium chloride flush (NS) 0.9 % injection 3 mL (has no administration in time range)  0.9 %  sodium chloride infusion (has no administration in time range)  0.9 %  sodium chloride infusion (has no administration in time range)  HYDROcodone-acetaminophen (NORCO/VICODIN) 5-325 MG per tablet 1-2 tablet (has no administration in time range)  morphine (PF) 2 MG/ML injection 2 mg (has no administration in time range)  bisacodyl (DULCOLAX) EC tablet 5 mg (has no administration in time range)  senna-docusate (Senokot-S) tablet 1 tablet (has no administration in time range)  ondansetron (ZOFRAN) tablet 4 mg (has no administration in time range)    Or  ondansetron (ZOFRAN) injection 4 mg (has no administration in time range)  levalbuterol (XOPENEX) nebulizer solution 0.63 mg (has no administration in time range)  morphine (PF) 4 MG/ML injection 6 mg (6 mg Intravenous Given 03/18/23 2329)  ondansetron (ZOFRAN) injection 4 mg (4 mg Intravenous Given 03/18/23 2339)  cefTRIAXone (ROCEPHIN) 2 g in sodium chloride 0.9 % 100 mL IVPB (2 g Intravenous New Bag/Given 03/19/23 0010)  acetaminophen (TYLENOL) tablet 650 mg (650 mg Oral Given 03/18/23 2359)    ED Course/ Medical Decision Making/ A&P                                  Medical Decision Making Amount and/or Complexity of Data Reviewed Labs: ordered. Radiology: ordered.  Risk OTC drugs. Prescription drug management.   This patient presents to the ED with chief complaint(s) of chest pain, lower, R side with pertinent past medical history of  .The complaint involves an extensive differential diagnosis and also carries with it a high risk of complications and morbidity.    The differential diagnosis includes pneumonia, pulm embolism, pleural effusion, empyema, pulmonary hemorrhage, postobstructive pneumonia, worsening metastatic disease to the lungs.  The initial plan is to get basic labs.  Will get x-ray of the chest.  Will consider CT PE based on patient's lab findings and reassessments.   Additional history obtained: Records reviewed  CT abdomen pelvis from earlier today, oncology  notes.  Independent labs interpretation:  The following labs were independently interpreted: CBC is reassuring.  Independent visualization and interpretation of imaging: - I independently visualized the following imaging with scope of interpretation limited to determining acute life threatening conditions related to emergency care: X-ray of the chest, which revealed right-sided pneumonia.  Treatment and Reassessment: We did order CT PE initially, given that patient was not complaining of cough, had no fevers.  However, she did spike a fever in the ER.  At that time CT PE was canceled as pretest viability for pneumonia jumped significantly.  CT scan also more consistent with pneumonia.    Final Clinical Impression(s) / ED Diagnoses Final diagnoses:  Community acquired pneumonia of right lower lobe of lung    Rx / DC Orders ED Discharge Orders     None         Derwood Kaplan, MD 03/19/23 [phone]

## 2023-03-19 ENCOUNTER — Encounter (HOSPITAL_COMMUNITY): Payer: Self-pay | Admitting: Internal Medicine

## 2023-03-19 DIAGNOSIS — C7800 Secondary malignant neoplasm of unspecified lung: Secondary | ICD-10-CM | POA: Diagnosis present

## 2023-03-19 DIAGNOSIS — C787 Secondary malignant neoplasm of liver and intrahepatic bile duct: Secondary | ICD-10-CM | POA: Diagnosis present

## 2023-03-19 DIAGNOSIS — J13 Pneumonia due to Streptococcus pneumoniae: Secondary | ICD-10-CM | POA: Diagnosis present

## 2023-03-19 DIAGNOSIS — I10 Essential (primary) hypertension: Secondary | ICD-10-CM | POA: Diagnosis present

## 2023-03-19 DIAGNOSIS — B953 Streptococcus pneumoniae as the cause of diseases classified elsewhere: Secondary | ICD-10-CM | POA: Diagnosis not present

## 2023-03-19 DIAGNOSIS — Z79899 Other long term (current) drug therapy: Secondary | ICD-10-CM | POA: Diagnosis not present

## 2023-03-19 DIAGNOSIS — C50919 Malignant neoplasm of unspecified site of unspecified female breast: Secondary | ICD-10-CM | POA: Diagnosis present

## 2023-03-19 DIAGNOSIS — J189 Pneumonia, unspecified organism: Secondary | ICD-10-CM | POA: Diagnosis not present

## 2023-03-19 DIAGNOSIS — R590 Localized enlarged lymph nodes: Secondary | ICD-10-CM | POA: Diagnosis present

## 2023-03-19 DIAGNOSIS — Z1152 Encounter for screening for COVID-19: Secondary | ICD-10-CM | POA: Diagnosis not present

## 2023-03-19 DIAGNOSIS — D630 Anemia in neoplastic disease: Secondary | ICD-10-CM | POA: Diagnosis present

## 2023-03-19 DIAGNOSIS — Z853 Personal history of malignant neoplasm of breast: Secondary | ICD-10-CM

## 2023-03-19 DIAGNOSIS — N179 Acute kidney failure, unspecified: Secondary | ICD-10-CM | POA: Insufficient documentation

## 2023-03-19 DIAGNOSIS — A403 Sepsis due to Streptococcus pneumoniae: Secondary | ICD-10-CM | POA: Diagnosis present

## 2023-03-19 DIAGNOSIS — R7881 Bacteremia: Secondary | ICD-10-CM | POA: Diagnosis not present

## 2023-03-19 DIAGNOSIS — J154 Pneumonia due to other streptococci: Secondary | ICD-10-CM | POA: Diagnosis not present

## 2023-03-19 DIAGNOSIS — E876 Hypokalemia: Secondary | ICD-10-CM | POA: Diagnosis present

## 2023-03-19 DIAGNOSIS — R0682 Tachypnea, not elsewhere classified: Secondary | ICD-10-CM | POA: Diagnosis present

## 2023-03-19 DIAGNOSIS — I071 Rheumatic tricuspid insufficiency: Secondary | ICD-10-CM | POA: Diagnosis not present

## 2023-03-19 DIAGNOSIS — F411 Generalized anxiety disorder: Secondary | ICD-10-CM | POA: Insufficient documentation

## 2023-03-19 DIAGNOSIS — E279 Disorder of adrenal gland, unspecified: Secondary | ICD-10-CM | POA: Diagnosis present

## 2023-03-19 DIAGNOSIS — R079 Chest pain, unspecified: Secondary | ICD-10-CM | POA: Diagnosis present

## 2023-03-19 DIAGNOSIS — I38 Endocarditis, valve unspecified: Secondary | ICD-10-CM | POA: Diagnosis not present

## 2023-03-19 DIAGNOSIS — I429 Cardiomyopathy, unspecified: Secondary | ICD-10-CM | POA: Diagnosis present

## 2023-03-19 DIAGNOSIS — A419 Sepsis, unspecified organism: Secondary | ICD-10-CM

## 2023-03-19 DIAGNOSIS — Z8249 Family history of ischemic heart disease and other diseases of the circulatory system: Secondary | ICD-10-CM | POA: Diagnosis not present

## 2023-03-19 DIAGNOSIS — F17210 Nicotine dependence, cigarettes, uncomplicated: Secondary | ICD-10-CM | POA: Diagnosis present

## 2023-03-19 DIAGNOSIS — E872 Acidosis, unspecified: Secondary | ICD-10-CM | POA: Diagnosis present

## 2023-03-19 LAB — RESPIRATORY PANEL BY PCR

## 2023-03-19 LAB — BLOOD CULTURE ID PANEL (REFLEXED) - BCID2

## 2023-03-19 LAB — PROTIME-INR
INR: 1.1 (ref 0.8–1.2)
Prothrombin Time: 14.5 s (ref 11.4–15.2)

## 2023-03-19 LAB — COMPREHENSIVE METABOLIC PANEL
ALT: 12 U/L (ref 0–44)
AST: 14 U/L — ABNORMAL LOW (ref 15–41)
Albumin: 2.1 g/dL — ABNORMAL LOW (ref 3.5–5.0)
Alkaline Phosphatase: 51 U/L (ref 38–126)
Anion gap: 7 (ref 5–15)
BUN: 22 mg/dL — ABNORMAL HIGH (ref 6–20)
CO2: 21 mmol/L — ABNORMAL LOW (ref 22–32)
Calcium: 7.6 mg/dL — ABNORMAL LOW (ref 8.9–10.3)
Chloride: 107 mmol/L (ref 98–111)
Creatinine, Ser: 1.23 mg/dL — ABNORMAL HIGH (ref 0.44–1.00)
GFR, Estimated: 58 mL/min — ABNORMAL LOW (ref >60)
Glucose, Bld: 94 mg/dL (ref 70–99)
Potassium: 3.3 mmol/L — ABNORMAL LOW (ref 3.5–5.1)
Sodium: 135 mmol/L (ref 135–145)
Total Bilirubin: 0.4 mg/dL (ref 0.0–1.2)
Total Protein: 6.6 g/dL (ref 6.5–8.1)

## 2023-03-19 LAB — URINALYSIS, ROUTINE W REFLEX MICROSCOPIC
Bacteria, UA: NONE SEEN
Bilirubin Urine: NEGATIVE
Glucose, UA: NEGATIVE mg/dL
Ketones, ur: NEGATIVE mg/dL
Leukocytes,Ua: NEGATIVE
Nitrite: NEGATIVE
Protein, ur: 30 mg/dL — AB
Specific Gravity, Urine: 1.005 (ref 1.005–1.030)
pH: 5 (ref 5.0–8.0)

## 2023-03-19 LAB — CBC
HCT: 27 % — ABNORMAL LOW (ref 36.0–46.0)
Hemoglobin: 9.1 g/dL — ABNORMAL LOW (ref 12.0–15.0)
MCH: 32.4 pg (ref 26.0–34.0)
MCHC: 33.7 g/dL (ref 30.0–36.0)
MCV: 96.1 fL (ref 80.0–100.0)
Platelets: 245 10*3/uL (ref 150–400)
RBC: 2.81 MIL/uL — ABNORMAL LOW (ref 3.87–5.11)
RDW: 14.6 % (ref 11.5–15.5)
WBC: 8.6 10*3/uL (ref 4.0–10.5)
nRBC: 0 % (ref 0.0–0.2)

## 2023-03-19 LAB — SODIUM, URINE, RANDOM: Sodium, Ur: <10 mmol/L

## 2023-03-19 LAB — CREATININE, URINE, RANDOM: Creatinine, Urine: 166 mg/dL

## 2023-03-19 LAB — APTT: aPTT: 24 s (ref 24–36)

## 2023-03-19 LAB — I-STAT CG4 LACTIC ACID, ED
Lactic Acid, Venous: 0.9 mmol/L (ref 0.5–1.9)
Lactic Acid, Venous: 1.6 mmol/L (ref 0.5–1.9)

## 2023-03-19 LAB — HIV ANTIBODY (ROUTINE TESTING W REFLEX): HIV Screen 4th Generation wRfx: NONREACTIVE

## 2023-03-19 LAB — STREP PNEUMONIAE URINARY ANTIGEN: Strep Pneumo Urinary Antigen: POSITIVE — AB

## 2023-03-19 MED ORDER — SODIUM CHLORIDE 0.9 % IV SOLN
2.0000 g | INTRAVENOUS | Status: DC
  Administered 2023-03-20 – 2023-03-21 (×2): 2 g via INTRAVENOUS
  Filled 2023-03-19 (×2): qty 20

## 2023-03-19 MED ORDER — SENNOSIDES-DOCUSATE SODIUM 8.6-50 MG PO TABS: 1.0000 | ORAL_TABLET | Freq: Every evening | ORAL | Status: DC | PRN

## 2023-03-19 MED ORDER — POTASSIUM CHLORIDE CRYS ER 20 MEQ PO TBCR
20.0000 meq | EXTENDED_RELEASE_TABLET | Freq: Once | ORAL | Status: AC
  Administered 2023-03-19: 20 meq via ORAL
  Filled 2023-03-19: qty 1

## 2023-03-19 MED ORDER — LIDOCAINE-PRILOCAINE 2.5-2.5 % EX CREA
TOPICAL_CREAM | Freq: Once | CUTANEOUS | Status: AC
  Administered 2023-03-19: 1 via TOPICAL
  Filled 2023-03-19: qty 5

## 2023-03-19 MED ORDER — MORPHINE SULFATE (PF) 2 MG/ML IV SOLN
1.0000 mg | INTRAVENOUS | Status: DC | PRN
  Administered 2023-03-21 – 2023-03-22 (×2): 1 mg via INTRAVENOUS
  Filled 2023-03-19 (×2): qty 1

## 2023-03-19 MED ORDER — SODIUM CHLORIDE 0.9 % IV SOLN
100.0000 mg | Freq: Two times a day (BID) | INTRAVENOUS | Status: DC
  Administered 2023-03-19: 100 mg via INTRAVENOUS
  Filled 2023-03-19: qty 100

## 2023-03-19 MED ORDER — LORAZEPAM 1 MG PO TABS
1.0000 mg | ORAL_TABLET | Freq: Three times a day (TID) | ORAL | Status: DC | PRN
  Administered 2023-03-19 – 2023-03-23 (×4): 1 mg via ORAL
  Filled 2023-03-19 (×4): qty 1

## 2023-03-19 MED ORDER — SENNOSIDES-DOCUSATE SODIUM 8.6-50 MG PO TABS
2.0000 | ORAL_TABLET | Freq: Two times a day (BID) | ORAL | Status: DC
  Administered 2023-03-19 – 2023-03-24 (×6): 2 via ORAL
  Filled 2023-03-19 (×12): qty 2

## 2023-03-19 MED ORDER — SODIUM CHLORIDE 0.9% FLUSH
3.0000 mL | Freq: Two times a day (BID) | INTRAVENOUS | Status: DC
  Administered 2023-03-19 – 2023-03-24 (×4): 3 mL via INTRAVENOUS

## 2023-03-19 MED ORDER — SODIUM CHLORIDE 0.9% FLUSH: 3.0000 mL | INTRAVENOUS | Status: DC | PRN

## 2023-03-19 MED ORDER — IPRATROPIUM-ALBUTEROL 0.5-2.5 (3) MG/3ML IN SOLN
3.0000 mL | Freq: Three times a day (TID) | RESPIRATORY_TRACT | Status: DC
  Administered 2023-03-19 – 2023-03-21 (×5): 3 mL via RESPIRATORY_TRACT
  Filled 2023-03-19 (×6): qty 3

## 2023-03-19 MED ORDER — IPRATROPIUM-ALBUTEROL 0.5-2.5 (3) MG/3ML IN SOLN
3.0000 mL | Freq: Three times a day (TID) | RESPIRATORY_TRACT | Status: DC
  Administered 2023-03-19 (×2): 3 mL via RESPIRATORY_TRACT
  Filled 2023-03-19 (×2): qty 3

## 2023-03-19 MED ORDER — SODIUM CHLORIDE 0.9 % IV BOLUS
500.0000 mL | Freq: Once | INTRAVENOUS | Status: AC
  Administered 2023-03-19: 500 mL via INTRAVENOUS

## 2023-03-19 MED ORDER — SODIUM CHLORIDE 0.9 % IV BOLUS
1000.0000 mL | INTRAVENOUS | Status: AC
  Administered 2023-03-19: 1000 mL via INTRAVENOUS

## 2023-03-19 MED ORDER — HEPARIN SODIUM (PORCINE) 5000 UNIT/ML IJ SOLN
5000.0000 [IU] | Freq: Three times a day (TID) | INTRAMUSCULAR | Status: DC
  Administered 2023-03-19 – 2023-03-21 (×7): 5000 [IU] via SUBCUTANEOUS
  Filled 2023-03-19 (×7): qty 1

## 2023-03-19 MED ORDER — SODIUM CHLORIDE 0.9 % IV SOLN: INTRAVENOUS | Status: AC

## 2023-03-19 MED ORDER — HYDROCODONE-ACETAMINOPHEN 5-325 MG PO TABS
1.0000 | ORAL_TABLET | ORAL | Status: DC | PRN
Start: 2023-03-19 — End: 2023-03-25
  Administered 2023-03-19: 1 via ORAL
  Administered 2023-03-19 – 2023-03-21 (×5): 2 via ORAL
  Administered 2023-03-22: 1 via ORAL
  Administered 2023-03-22 – 2023-03-23 (×4): 2 via ORAL
  Administered 2023-03-23: 1 via ORAL
  Administered 2023-03-23: 2 via ORAL
  Administered 2023-03-24: 1 via ORAL
  Administered 2023-03-25: 2 via ORAL
  Filled 2023-03-19 (×2): qty 2
  Filled 2023-03-19: qty 1
  Filled 2023-03-19 (×12): qty 2

## 2023-03-19 MED ORDER — VANCOMYCIN HCL IN DEXTROSE 1-5 GM/200ML-% IV SOLN
1000.0000 mg | INTRAVENOUS | Status: DC
  Administered 2023-03-19: 1000 mg via INTRAVENOUS
  Filled 2023-03-19: qty 200

## 2023-03-19 MED ORDER — MORPHINE SULFATE (PF) 2 MG/ML IV SOLN
2.0000 mg | INTRAVENOUS | Status: DC | PRN
Start: 2023-03-19 — End: 2023-03-19

## 2023-03-19 MED ORDER — SODIUM CHLORIDE 0.9 % IV SOLN: 250.0000 mL | INTRAVENOUS | Status: AC | PRN

## 2023-03-19 MED ORDER — CHLORHEXIDINE GLUCONATE CLOTH 2 % EX PADS
6.0000 | MEDICATED_PAD | Freq: Every day | CUTANEOUS | Status: DC
  Administered 2023-03-20 – 2023-03-25 (×7): 6 via TOPICAL

## 2023-03-19 MED ORDER — ALBUMIN HUMAN 25 % IV SOLN
50.0000 g | INTRAVENOUS | Status: AC
  Administered 2023-03-19: 12.5 g via INTRAVENOUS
  Filled 2023-03-19: qty 200

## 2023-03-19 MED ORDER — MIDODRINE HCL 5 MG PO TABS: 10.0000 mg | ORAL_TABLET | Freq: Once | ORAL | Status: DC

## 2023-03-19 MED ORDER — LEVALBUTEROL HCL 0.63 MG/3ML IN NEBU: 0.6300 mg | INHALATION_SOLUTION | Freq: Four times a day (QID) | RESPIRATORY_TRACT | Status: DC | PRN

## 2023-03-19 MED ORDER — BISACODYL 5 MG PO TBEC: 5.0000 mg | DELAYED_RELEASE_TABLET | Freq: Every day | ORAL | Status: DC | PRN

## 2023-03-19 MED ORDER — ONDANSETRON HCL 4 MG PO TABS: 4.0000 mg | ORAL_TABLET | Freq: Four times a day (QID) | ORAL | Status: DC | PRN

## 2023-03-19 MED ORDER — PIPERACILLIN-TAZOBACTAM 3.375 G IVPB: 3.3750 g | Freq: Three times a day (TID) | INTRAVENOUS | Status: DC

## 2023-03-19 MED ORDER — ONDANSETRON HCL 4 MG/2ML IJ SOLN: 4.0000 mg | Freq: Four times a day (QID) | INTRAMUSCULAR | Status: DC | PRN

## 2023-03-19 MED ORDER — LEVALBUTEROL HCL 0.63 MG/3ML IN NEBU
0.6300 mg | INHALATION_SOLUTION | Freq: Once | RESPIRATORY_TRACT | Status: AC
  Administered 2023-03-19: 0.63 mg via RESPIRATORY_TRACT
  Filled 2023-03-19: qty 3

## 2023-03-19 MED ORDER — SODIUM CHLORIDE 0.9 % IV SOLN: 2.0000 g | INTRAVENOUS | Status: DC

## 2023-03-19 MED ORDER — SODIUM CHLORIDE 0.9% FLUSH: 10.0000 mL | INTRAVENOUS | Status: DC | PRN

## 2023-03-19 MED ORDER — GUAIFENESIN ER 600 MG PO TB12
1200.0000 mg | ORAL_TABLET | Freq: Two times a day (BID) | ORAL | Status: DC
  Administered 2023-03-19 – 2023-03-25 (×13): 1200 mg via ORAL
  Filled 2023-03-19 (×13): qty 2

## 2023-03-19 NOTE — ED Notes (Signed)
Per Rhunette Croft, MD one set of cultures was to be obtained from pt's port.

## 2023-03-19 NOTE — Sepsis Progress Note (Signed)
Notified provider of need to order fluid bolus and vasopressors.  

## 2023-03-19 NOTE — Progress Notes (Signed)
Pharmacy Antibiotic Note  Connie Wells is a 38 y.o. female admitted on 03/18/2023 with medical history significant of breast cancer currently on Herceptin maintenance therapy, pulmonary nodule and liver lesion, generalized anxiety disorder presented to emergency department with complaining of right-sided rib pain for last 4 days with intermittent shortness of breath. .  Pharmacy has been consulted to dose vancomycin and zosyn for sepsis.  Plan: Vanc 1gm IV x 1 then 1gm q24h (AUC 498.8, Scr 1.23) Zosyn 3.375g IV Q8H infused over 4hrs. Follow renal function, cultures and clinical course  Weight: 56 kg (123 lb 7.3 oz)  Temp (24hrs), Avg:100 F (37.8 C), Min:98.3 F (36.8 C), Max:102.9 F (39.4 C)  Recent Labs  Lab 03/18/23 1931 03/19/23 0016 03/19/23 0444  WBC 10.7*  --  8.6  CREATININE 1.18*  --  1.23*  LATICACIDVEN  --  0.9  --     Estimated Creatinine Clearance: 54.6 mL/min (A) (by C-G formula based on SCr of 1.23 mg/dL (H)).    No Known Allergies  Antimicrobials this admission: 1/22 azith  x 1 1/22 CTX x 1 1/22 doxy >> 1/22 vanc >> 1/22 zosyn >>  Dose adjustments this admission:   Microbiology results: 1/22 BCx:  1/22 Sputum:   Thank you for allowing pharmacy to be a part of this patient's care.  Arley Phenix RPh 03/19/2023, 5:37 AM

## 2023-03-19 NOTE — Progress Notes (Signed)
PHARMACY - PHYSICIAN COMMUNICATION CRITICAL VALUE ALERT - BLOOD CULTURE IDENTIFICATION (BCID)  CHARLAYNE KOPECKY is an 38 y.o. female who presented to [facility] on 03/18/2023 with a chief complaint of right-sided rib pain for last 4 days with intermittent shortness of breath.      Assessment:   Admitted with sepsis due to pneumonia.  1/22 BCx: 4 out of 4 positive for gpc in chains; per caller from Microbiology lab, BCID detected streptococcus pneumoniae  Name of physician (or Provider) Contacted: [place]  Current antibiotics: Rocephin 2 g IV q24h  Changes to prescribed antibiotics recommended:  Patient is on recommended antibiotics - No changes needed  No results found for this or any previous visit.  Lynden Ang, PharmD, BCPS 03/19/2023  4:03 PM

## 2023-03-19 NOTE — Sepsis Progress Note (Signed)
Elink monitoring for the code sepsis protocol.  

## 2023-03-19 NOTE — Progress Notes (Signed)
PROGRESS NOTE    Connie Wells  ONG:[id number] DOB: 06/17/1985 DOA: 03/18/2023 PCP: Default, Provider, MD   Brief Narrative: 38 year old with past medical history significant for breast cancer on Herceptin maintenance therapy, pulmonary nodule, liver lesion, generalized anxiety disorder present complaining of right side rib pain, intermittent associated with shortness of breath.  Evaluation in the ED she was found to be febrile temperature 102, chest x-ray dense dense consolidation within the right lung base.  CT chest new dense consolidation of margin of the right lower lobe with some sparing in the superior segment but multiple segmental involvement.  Appearance favor bacterial pneumonia pattern although pulmonary hemorrhage can have similar appearance.   Assessment & Plan:   Principal Problem:   Sepsis due to pneumonia [facility]) Active Problems:   History of breast cancer   AKI (acute kidney injury) (HCC)   GAD (generalized anxiety disorder)   CAP (community acquired pneumonia)   1-Sepsis secondary to pneumonia -Patient presented with fever temperature 102, tachycardiac heart rate 126, tachypnea respiration rate 33, hypotension, source of infection pneumonia.  CT chest with finding consistent with consolidation of the right lower lobe with some spurring in the superior segment. -Strep pneumonia antigen positive -Antibiotics changed to IV ceftriaxone 2 g -Blood cultures x 2 growing Gram  positive cocci in chains.  Continue with IV ceftriaxone -Continue with guaifenesin as needed nebulizers -Denies hemoptysis  Right-sided chest wall pain secondary to pneumonia CT chest:New dense consolidation of much of the right lower lobe with some sparing in the superior segment but multi segmental involvement otherwise. Minimal spillover ground-glass opacity into the right middle lobe. Appearance favors bacterial pneumonia pattern although pulmonary hemorrhage can have a similar appearance.     AKI: -Creatinine baseline 0.9 -Creatinine peaked to 1.2.  Continue with IV fluids  Generalized anxiety disorder As needed Ativan  Metastatic breast cancer: Patient has breast cancer with metastasis to multiple side currently on maintenance therapy with Herceptin and Perjeta. -Added Dr. Pamelia Hoit on the treatment team  Hypokalemia: Replete     Estimated body mass index is 21.19 kg/m as calculated from the following:   Height as of 01/21/23: 5\' 4"  (1.626 m).   Weight as of this encounter: 56 kg.   DVT prophylaxis: Heparin Code Status: Full code Family Communication: Care discussed with patient Disposition Plan:  Status is: Inpatient Remains inpatient appropriate because: Management of pneumonia bacteremia    Consultants:  None  Procedures:  none  Antimicrobials:    Subjective: She reports cough, thick phlegm, reports mild shortness of breath,   Objective: Vitals:   03/19/23 0615 03/19/23 0630 03/19/23 0645 03/19/23 0700  BP: 100/72 92/68 94/69  91/64  Pulse:  (!) 118 (!) 113 (!) 118  Resp:  20  18  Temp:      TempSrc:      SpO2:  96% 94% 95%  Weight:        Intake/Output Summary (Last 24 hours) at 03/19/2023 0735 Last data filed at 03/19/2023 0050 Gross per 24 hour  Intake 3 ml  Output --  Net 3 ml   Filed Weights   03/18/23 1757 03/19/23 0228  Weight: 56 kg 56 kg    Examination:  General exam: Appears calm and comfortable  Respiratory system: Clear to auscultation. Respiratory effort normal. Cardiovascular system: S1 & S2 heard, RRR.  Gastrointestinal system: Abdomen is nondistended, soft and nontender. Central nervous system: Alert and oriented.  Extremities: Symmetric 5 x 5 power.   Data Reviewed: I have personally reviewed  following labs and imaging studies  CBC: Recent Labs  Lab 03/18/23 1931 03/19/23 0444  WBC 10.7* 8.6  HGB 11.4* 9.1*  HCT 33.2* 27.0*  MCV 94.6 96.1  PLT 266 245   Basic Metabolic Panel: Recent Labs  Lab  03/18/23 1931 03/19/23 0444  NA 134* 135  K 3.6 3.3*  CL 103 107  CO2 22 21*  GLUCOSE 108* 94  BUN 20 22*  CREATININE 1.18* 1.23*  CALCIUM 8.4* 7.6*   GFR: Estimated Creatinine Clearance: 54.6 mL/min (A) (by C-G formula based on SCr of 1.23 mg/dL (H)). Liver Function Tests: Recent Labs  Lab 03/18/23 1931 03/19/23 0444  AST 20 14*  ALT 15 12  ALKPHOS 62 51  BILITOT 0.7 0.4  PROT 8.1 6.6  ALBUMIN 2.7* 2.1*   No results for input(s): "LIPASE", "AMYLASE" in the last 168 hours. No results for input(s): "AMMONIA" in the last 168 hours. Coagulation Profile: Recent Labs  Lab 03/19/23 0018  INR 1.1   Cardiac Enzymes: No results for input(s): "CKTOTAL", "CKMB", "CKMBINDEX", "TROPONINI" in the last 168 hours. BNP (last 3 results) No results for input(s): "PROBNP" in the last 8760 hours. HbA1C: No results for input(s): "HGBA1C" in the last 72 hours. CBG: No results for input(s): "GLUCAP" in the last 168 hours. Lipid Profile: No results for input(s): "CHOL", "HDL", "LDLCALC", "TRIG", "CHOLHDL", "LDLDIRECT" in the last 72 hours. Thyroid Function Tests: No results for input(s): "TSH", "T4TOTAL", "FREET4", "T3FREE", "THYROIDAB" in the last 72 hours. Anemia Panel: No results for input(s): "VITAMINB12", "FOLATE", "FERRITIN", "TIBC", "IRON", "RETICCTPCT" in the last 72 hours. Sepsis Labs: Recent Labs  Lab 03/19/23 0016 03/19/23 0556  LATICACIDVEN 0.9 1.6    Recent Results (from the past 240 hours)  Respiratory (~20 pathogens) panel by PCR     Status: None   Collection Time: 03/19/23  1:05 AM   Specimen: Nasopharyngeal Swab; Respiratory  Result Value Ref Range Status   Adenovirus NOT DETECTED NOT DETECTED Final   Coronavirus 229E NOT DETECTED NOT DETECTED Final    Comment: (NOTE) The Coronavirus on the Respiratory Panel, DOES NOT test for the novel  Coronavirus (2019 nCoV)    Coronavirus HKU1 NOT DETECTED NOT DETECTED Final   Coronavirus NL63 NOT DETECTED NOT DETECTED  Final   Coronavirus OC43 NOT DETECTED NOT DETECTED Final   Metapneumovirus NOT DETECTED NOT DETECTED Final   Rhinovirus / Enterovirus NOT DETECTED NOT DETECTED Final   Influenza A NOT DETECTED NOT DETECTED Final   Influenza B NOT DETECTED NOT DETECTED Final   Parainfluenza Virus 1 NOT DETECTED NOT DETECTED Final   Parainfluenza Virus 2 NOT DETECTED NOT DETECTED Final   Parainfluenza Virus 3 NOT DETECTED NOT DETECTED Final   Parainfluenza Virus 4 NOT DETECTED NOT DETECTED Final   Respiratory Syncytial Virus NOT DETECTED NOT DETECTED Final   Bordetella pertussis NOT DETECTED NOT DETECTED Final   Bordetella Parapertussis NOT DETECTED NOT DETECTED Final   Chlamydophila pneumoniae NOT DETECTED NOT DETECTED Final   Mycoplasma pneumoniae NOT DETECTED NOT DETECTED Final    Comment: Performed at [facility], [address] [place], [place] [address]         Radiology Studies: DG Chest Port 1 View Result Date: 03/18/2023 CLINICAL DATA:  Pneumonia EXAM: PORTABLE CHEST 1 VIEW COMPARISON:  None Available. FINDINGS: There is dense consolidation within the right lung base, in keeping changes of acute lobar pneumonia in the appropriate clinical setting. Small right pleural effusion may be present. No pneumothorax. No pleural  effusion on the left. Left lung is clear. Cardiac size within normal limits. Left internal jugular port tip seen within the superior vena cava. Pulmonary vascularity is normal. Suspected right hilar adenopathy. No acute bone abnormality. IMPRESSION: 1. Dense consolidation within the right lung base, in keeping changes of acute lobar pneumonia in the appropriate clinical setting. Radiographic follow-up to resolution is recommended. 2. Suspected right hilar adenopathy. Electronically Signed   By: Helyn Numbers M.D.   On: 03/18/2023 22:09   CT CHEST ABDOMEN PELVIS W CONTRAST Result Date: 03/18/2023 CLINICAL DATA:  Breast cancer restaging.  Right chest pain. * Tracking Code: BO *  EXAM: CT CHEST, ABDOMEN, AND PELVIS WITH CONTRAST TECHNIQUE: Multidetector CT imaging of the chest, abdomen and pelvis was performed following the standard protocol during bolus administration of intravenous contrast. RADIATION DOSE REDUCTION: This exam was performed according to the departmental dose-optimization program which includes automated exposure control, adjustment of the mA and/or kV according to patient size and/or use of iterative reconstruction technique. CONTRAST:  OMNIPAQUE IOHEXOL 300 MG/ML  SOLN COMPARISON:  11/27/2022 FINDINGS: CT CHEST FINDINGS Cardiovascular: Left Port-A-Cath tip: SVC. Mediastinum/Nodes: Clustered right axillary lymph nodes including an index node measuring 1.1 cm in short axis on image 11 series 2, improved from prior exam where this measured 1.7 cm. Small left axillary and bilateral subpectoral lymph nodes are present. Subglandular lesion in the right breast potentially with associated calcification measuring 2.1 by 1.2 cm on image 33 series 2, formerly 2.8 by 1.3 cm. The more medial solid breast nodule measures about 0.9 cm in diameter on image 37 series 2, previously 2.0 by 2.0 cm. This represents a substantial improvement. Lungs/Pleura: New dense consolidation of much of the right lower lobe with some sparing in the superior segment but multi segmental involvement otherwise. Minimal spillover ground-glass opacity into the right middle lobe for example on image 83 series 4. Appearance favors bacterial pneumonia pattern although pulmonary hemorrhage can have a similar appearance. Musculoskeletal: Unremarkable CT ABDOMEN PELVIS FINDINGS Hepatobiliary: Unremarkable Pancreas: Unremarkable Spleen: Unremarkable Adrenals/Urinary Tract: Stable scarring in the left kidney upper pole. Absent right kidney with compensatory hypertrophy of the left kidney. Left adrenal mass 1.7 by 2.0 cm on image 54 series 2, nonspecific but not hypermetabolic on 06/19/2022 hence most likely  benign. Stomach/Bowel: Unremarkable Vascular/Lymphatic: Unremarkable Reproductive: Retroverted uterus.  Otherwise unremarkable. Other: Mild density at the umbilicus is likely incidental, correlate with any tenderness along the umbilical region in assessing for omphalitis. Musculoskeletal: Unremarkable IMPRESSION: 1. New dense consolidation of much of the right lower lobe with some sparing in the superior segment but multi segmental involvement otherwise. Minimal spillover ground-glass opacity into the right middle lobe. Appearance favors bacterial pneumonia pattern although pulmonary hemorrhage can have a similar appearance. We are attempting notification of the referring call service, and given the after hours timing, substantial right chest pain, and findings in the right lower lobe, technologist personnel are directing the patient to the emergency department. 2. Substantial improvement in the right breast lesions and right axillary adenopathy. 3. Mild density at the umbilicus is likely incidental, correlate with any tenderness along the umbilical region in assessing for omphalitis. 4. Absent right kidney with compensatory hypertrophy of the left kidney. 5. Stable scarring in the left kidney upper pole. 6. Left adrenal mass 1.7 by 2.0 cm, nonspecific but not hypermetabolic on 06/19/2022 hence most likely benign. Electronically Signed   By: Gaylyn Rong M.D.   On: 03/18/2023 17:17  Scheduled Meds:  heparin  5,000 Units Subcutaneous Q8H   ondansetron (ZOFRAN) IV  4 mg Intravenous Once   potassium chloride  20 mEq Oral Once   senna-docusate  2 tablet Oral BID   sodium chloride flush  3 mL Intravenous Q12H   Continuous Infusions:  sodium chloride     sodium chloride 150 mL/hr at 03/19/23 0519   cefTRIAXone (ROCEPHIN)  IV       LOS: 0 days    Time spent: 35 minutes    Connie Knobloch A Narciso Stoutenburg, MD [facility]   If 7PM-7AM, please contact  night-coverage [email or web address]  03/19/2023, 7:35 AM

## 2023-03-19 NOTE — ED Notes (Signed)
ED TO INPATIENT HANDOFF REPORT  ED Nurse Name and Phone #: Silviano Neuser  S Name/Age/Gender Connie Wells 38 y.o. female Room/Bed: WA18/WA18  Code Status   Code Status: Full Code  Home/SNF/Other Home Patient oriented to: self, place, time, and situation Is this baseline? Yes   Triage Complete: Triage complete  Chief Complaint CAP (community acquired pneumonia) [J18.9]  Triage Note Patient referred to ED from CT due to right sided PNA on CT scan.  Pt reports right side rib pain x4 days with intermittent sob.    Allergies No Known Allergies  Level of Care/Admitting Diagnosis ED Disposition     ED Disposition  Admit   Condition  --   Comment  Hospital Area: [facility] [[id number]]  Level of Care: Progressive [102]  Admit to Progressive based on following criteria: Other see comments  Comments: Sepsis secondary to pneumonia  May admit patient to [facility] if equivalent level of care is available:: No  Covid Evaluation: Symptomatic Person Under Investigation (PUI) or recent exposure (last 10 days) *Testing Required*  Diagnosis: CAP (community acquired pneumonia) [161096]  Admitting Physician: Tereasa Coop [[id number]]  Attending Physician: Tereasa Coop [[id number]]  Certification:: I certify this patient will need inpatient services for at least 2 midnights  Expected Medical Readiness: 03/24/2023          B Medical/Surgery History Past Medical History:  Diagnosis Date   Herpes genitalia    Solitary kidney    Past Surgical History:  Procedure Laterality Date   DILATION AND EVACUATION     ECTOPIC PREGNANCY SURGERY       A IV Location/Drains/Wounds Patient Lines/Drains/Airways Status     Active Line/Drains/Airways     Name Placement date Placement time Site Days   Implanted Port 08/16/20 Left Chest 08/16/20  --  Chest  945   Peripheral IV 03/18/23 22 G Right Antecubital 03/18/23  2355  Antecubital  1             Intake/Output Last 24 hours  Intake/Output Summary (Last 24 hours) at 03/19/2023 1426 Last data filed at 03/19/2023 0050 Gross per 24 hour  Intake 3 ml  Output --  Net 3 ml    Labs/Imaging Results for orders placed or performed during the hospital encounter of 03/18/23 (from the past 48 hours)  Comprehensive metabolic panel     Status: Abnormal   Collection Time: 03/18/23  7:31 PM  Result Value Ref Range   Sodium 134 (L) 135 - 145 mmol/L   Potassium 3.6 3.5 - 5.1 mmol/L   Chloride 103 98 - 111 mmol/L   CO2 22 22 - 32 mmol/L   Glucose, Bld 108 (H) 70 - 99 mg/dL    Comment: Glucose reference range applies only to samples taken after fasting for at least 8 hours.   BUN 20 6 - 20 mg/dL   Creatinine, Ser 2.95 (H) 0.44 - 1.00 mg/dL   Calcium 8.4 (L) 8.9 - 10.3 mg/dL   Total Protein 8.1 6.5 - 8.1 g/dL   Albumin 2.7 (L) 3.5 - 5.0 g/dL   AST 20 15 - 41 U/L   ALT 15 0 - 44 U/L   Alkaline Phosphatase 62 38 - 126 U/L   Total Bilirubin 0.7 0.0 - 1.2 mg/dL   GFR, Estimated >62 >13 mL/min    Comment: (NOTE) Calculated using the CKD-EPI Creatinine Equation (2021)    Anion gap 9 5 - 15    Comment: Performed at [facility], [address], [place], [place] [address]  CBC     Status: Abnormal   Collection Time: 03/18/23  7:31 PM  Result Value Ref Range   WBC 10.7 (H) 4.0 - 10.5 K/uL   RBC 3.51 (L) 3.87 - 5.11 MIL/uL   Hemoglobin 11.4 (L) 12.0 - 15.0 g/dL   HCT 13.0 (L) 86.5 - 78.4 %   MCV 94.6 80.0 - 100.0 fL   MCH 32.5 26.0 - 34.0 pg   MCHC 34.3 30.0 - 36.0 g/dL   RDW 69.6 29.5 - 28.4 %   Platelets 266 150 - 400 K/uL   nRBC 0.0 0.0 - 0.2 %    Comment: Performed at [facility], [address], [place], [place] [address]  hCG, serum, qualitative     Status: None   Collection Time: 03/18/23  7:31 PM  Result Value Ref Range   Preg, Serum NEGATIVE NEGATIVE    Comment:        THE SENSITIVITY OF THIS METHODOLOGY IS >10 mIU/mL. Performed at  [facility], [address] [place], [place] [address]   Blood Culture (routine x 2)     Status: None (Preliminary result)   Collection Time: 03/19/23 12:05 AM   Specimen: BLOOD  Result Value Ref Range   Specimen Description      BLOOD SITE NOT SPECIFIED Performed at [facility], [address] [place], [place] [address]    Special Requests      BOTTLES DRAWN AEROBIC AND ANAEROBIC Blood Culture adequate volume Performed at [facility], [address], [place], [place] [address]    Culture  Setup Time      GRAM POSITIVE COCCI IN CHAINS IN BOTH AEROBIC AND ANAEROBIC BOTTLES Organism ID to follow Performed at [facility], [address] [place], [place] [address]    Culture GRAM POSITIVE COCCI IN CHAINS    Report Status PENDING   I-Stat Lactic Acid, ED     Status: None   Collection Time: 03/19/23 12:16 AM  Result Value Ref Range   Lactic Acid, Venous 0.9 0.5 - 1.9 mmol/L  Protime-INR     Status: None   Collection Time: 03/19/23 12:18 AM  Result Value Ref Range   Prothrombin Time 14.5 11.4 - 15.2 seconds   INR 1.1 0.8 - 1.2    Comment: (NOTE) INR goal varies based on device and disease states. Performed at [facility], [address] [place], [place] [address]   APTT     Status: None   Collection Time: 03/19/23 12:18 AM  Result Value Ref Range   aPTT 24 24 - 36 seconds    Comment: Performed at [facility], [address] [place], [place] [address]  Blood Culture (routine x 2)     Status: None (Preliminary result)   Collection Time: 03/19/23 12:27 AM   Specimen: BLOOD RIGHT FOREARM  Result Value Ref Range   Specimen Description      BLOOD RIGHT FOREARM Performed at [facility], [address] [place], [place] [address]    Special Requests      BOTTLES DRAWN AEROBIC AND ANAEROBIC Blood Culture results may not be optimal due to an inadequate volume of  blood received in culture bottles Performed at [facility], [address], [place], [place] [address]    Culture  Setup Time      GRAM POSITIVE COCCI IN  CHAINS IN BOTH AEROBIC AND ANAEROBIC BOTTLES Performed at [facility], [address] [place], [place] [address]    Culture GRAM POSITIVE COCCI IN CHAINS    Report Status PENDING   Urinalysis, Routine w reflex microscopic -Urine, Clean Catch     Status: Abnormal   Collection Time: 03/19/23 12:37 AM  Result Value Ref Range   Color, Urine YELLOW YELLOW   APPearance CLEAR CLEAR   Specific Gravity, Urine 1.005 1.005 - 1.030   pH 5.0 5.0 - 8.0   Glucose, UA NEGATIVE NEGATIVE mg/dL   Hgb urine dipstick SMALL (A) NEGATIVE   Bilirubin Urine NEGATIVE NEGATIVE   Ketones, ur NEGATIVE NEGATIVE mg/dL   Protein, ur 30 (A) NEGATIVE mg/dL   Nitrite NEGATIVE NEGATIVE   Leukocytes,Ua NEGATIVE NEGATIVE   RBC / HPF 0-5 0 - 5 RBC/hpf   WBC, UA 6-10 0 - 5 WBC/hpf   Bacteria, UA NONE SEEN NONE SEEN   Squamous Epithelial / HPF 0-5 0 - 5 /HPF    Comment: Performed at [facility], [address], [place], [place] [address]  Sodium, urine, random     Status: None   Collection Time: 03/19/23 12:38 AM  Result Value Ref Range   Sodium, Ur <10 mmol/L    Comment: Performed at [facility], [address], [place], [place] [address]  Creatinine, urine, random     Status: None   Collection Time: 03/19/23 12:38 AM  Result Value Ref Range   Creatinine, Urine 166 mg/dL    Comment: Performed at [facility], [address], [place], [place] [address]  HIV Antibody (routine testing w rflx)     Status: None   Collection Time: 03/19/23  1:04 AM  Result Value Ref Range   HIV Screen 4th Generation wRfx Non Reactive Non Reactive    Comment: Performed at [facility], [address], [place], [place] [address]  Respiratory (~20 pathogens) panel by PCR     Status: None    Collection Time: 03/19/23  1:05 AM   Specimen: Nasopharyngeal Swab; Respiratory  Result Value Ref Range   Adenovirus NOT DETECTED NOT DETECTED   Coronavirus 229E NOT DETECTED NOT DETECTED    Comment: (NOTE) The Coronavirus on the Respiratory Panel, DOES NOT test for the novel  Coronavirus (2019 nCoV)    Coronavirus HKU1 NOT DETECTED NOT DETECTED   Coronavirus NL63 NOT DETECTED NOT DETECTED   Coronavirus OC43 NOT DETECTED NOT DETECTED   Metapneumovirus NOT DETECTED NOT DETECTED   Rhinovirus / Enterovirus NOT DETECTED NOT DETECTED   Influenza A NOT DETECTED NOT DETECTED   Influenza B NOT DETECTED NOT DETECTED   Parainfluenza Virus 1 NOT DETECTED NOT DETECTED   Parainfluenza Virus 2 NOT DETECTED NOT DETECTED   Parainfluenza Virus 3 NOT DETECTED NOT DETECTED   Parainfluenza Virus 4 NOT DETECTED NOT DETECTED   Respiratory Syncytial Virus NOT DETECTED NOT DETECTED   Bordetella pertussis NOT DETECTED NOT DETECTED   Bordetella Parapertussis NOT DETECTED NOT DETECTED   Chlamydophila pneumoniae NOT DETECTED NOT DETECTED   Mycoplasma pneumoniae NOT DETECTED NOT DETECTED    Comment: Performed at [facility], [address], [place], [place] [address]  CBC     Status: Abnormal   Collection Time: 03/19/23  4:44 AM  Result Value Ref Range   WBC 8.6 4.0 - 10.5 K/uL   RBC 2.81 (L) 3.87 - 5.11 MIL/uL   Hemoglobin 9.1 (L) 12.0 -  15.0 g/dL   HCT 54.0 (L) 98.1 - 19.1 %   MCV 96.1 80.0 - 100.0 fL   MCH 32.4 26.0 - 34.0 pg   MCHC 33.7 30.0 - 36.0 g/dL   RDW 47.8 29.5 - 62.1 %   Platelets 245 150 - 400 K/uL   nRBC 0.0 0.0 - 0.2 %    Comment: Performed at [facility], [address], [place], [place] [address]  Comprehensive metabolic panel     Status: Abnormal   Collection Time: 03/19/23  4:44 AM  Result Value Ref Range   Sodium 135 135 - 145 mmol/L   Potassium 3.3 (L) 3.5 - 5.1 mmol/L   Chloride 107 98 - 111 mmol/L   CO2 21 (L) 22 - 32 mmol/L   Glucose, Bld 94 70 -  99 mg/dL    Comment: Glucose reference range applies only to samples taken after fasting for at least 8 hours.   BUN 22 (H) 6 - 20 mg/dL   Creatinine, Ser 7.84 (H) 0.44 - 1.00 mg/dL   Calcium 7.6 (L) 8.9 - 10.3 mg/dL   Total Protein 6.6 6.5 - 8.1 g/dL   Albumin 2.1 (L) 3.5 - 5.0 g/dL   AST 14 (L) 15 - 41 U/L   ALT 12 0 - 44 U/L   Alkaline Phosphatase 51 38 - 126 U/L   Total Bilirubin 0.4 0.0 - 1.2 mg/dL   GFR, Estimated 58 (L) >60 mL/min    Comment: (NOTE) Calculated using the CKD-EPI Creatinine Equation (2021)    Anion gap 7 5 - 15    Comment: Performed at [facility], [address], [place], [place] [address]  Strep pneumoniae urinary antigen     Status: Abnormal   Collection Time: 03/19/23  4:53 AM  Result Value Ref Range   Strep Pneumo Urinary Antigen POSITIVE (A) NEGATIVE    Comment: RESULTS CALLED TO, READ BY AND VERIFIED WITH RN T.Machel Violante ON 03/19/23 AT 0717 BY NM Performed at [facility], [address] [place], [place] [address]   I-Stat Lactic Acid, ED     Status: None   Collection Time: 03/19/23  5:56 AM  Result Value Ref Range   Lactic Acid, Venous 1.6 0.5 - 1.9 mmol/L   DG Chest Port 1 View Result Date: 03/18/2023 CLINICAL DATA:  Pneumonia EXAM: PORTABLE CHEST 1 VIEW COMPARISON:  None Available. FINDINGS: There is dense consolidation within the right lung base, in keeping changes of acute lobar pneumonia in the appropriate clinical setting. Small right pleural effusion may be present. No pneumothorax. No pleural effusion on the left. Left lung is clear. Cardiac size within normal limits. Left internal jugular port tip seen within the superior vena cava. Pulmonary vascularity is normal. Suspected right hilar adenopathy. No acute bone abnormality. IMPRESSION: 1. Dense consolidation within the right lung base, in keeping changes of acute lobar pneumonia in the appropriate clinical setting. Radiographic follow-up to resolution is recommended. 2.  Suspected right hilar adenopathy. Electronically Signed   By: Helyn Numbers M.D.   On: 03/18/2023 22:09   CT CHEST ABDOMEN PELVIS W CONTRAST Result Date: 03/18/2023 CLINICAL DATA:  Breast cancer restaging.  Right chest pain. * Tracking Code: BO * EXAM: CT CHEST, ABDOMEN, AND PELVIS WITH CONTRAST TECHNIQUE: Multidetector CT imaging of the chest, abdomen and pelvis was performed following the standard protocol during bolus administration of intravenous contrast. RADIATION DOSE REDUCTION: This exam was performed according to the departmental dose-optimization program which includes  automated exposure control, adjustment of the mA and/or kV according to patient size and/or use of iterative reconstruction technique. CONTRAST:  OMNIPAQUE IOHEXOL 300 MG/ML  SOLN COMPARISON:  11/27/2022 FINDINGS: CT CHEST FINDINGS Cardiovascular: Left Port-A-Cath tip: SVC. Mediastinum/Nodes: Clustered right axillary lymph nodes including an index node measuring 1.1 cm in short axis on image 11 series 2, improved from prior exam where this measured 1.7 cm. Small left axillary and bilateral subpectoral lymph nodes are present. Subglandular lesion in the right breast potentially with associated calcification measuring 2.1 by 1.2 cm on image 33 series 2, formerly 2.8 by 1.3 cm. The more medial solid breast nodule measures about 0.9 cm in diameter on image 37 series 2, previously 2.0 by 2.0 cm. This represents a substantial improvement. Lungs/Pleura: New dense consolidation of much of the right lower lobe with some sparing in the superior segment but multi segmental involvement otherwise. Minimal spillover ground-glass opacity into the right middle lobe for example on image 83 series 4. Appearance favors bacterial pneumonia pattern although pulmonary hemorrhage can have a similar appearance. Musculoskeletal: Unremarkable CT ABDOMEN PELVIS FINDINGS Hepatobiliary: Unremarkable Pancreas: Unremarkable Spleen: Unremarkable Adrenals/Urinary  Tract: Stable scarring in the left kidney upper pole. Absent right kidney with compensatory hypertrophy of the left kidney. Left adrenal mass 1.7 by 2.0 cm on image 54 series 2, nonspecific but not hypermetabolic on 06/19/2022 hence most likely benign. Stomach/Bowel: Unremarkable Vascular/Lymphatic: Unremarkable Reproductive: Retroverted uterus.  Otherwise unremarkable. Other: Mild density at the umbilicus is likely incidental, correlate with any tenderness along the umbilical region in assessing for omphalitis. Musculoskeletal: Unremarkable IMPRESSION: 1. New dense consolidation of much of the right lower lobe with some sparing in the superior segment but multi segmental involvement otherwise. Minimal spillover ground-glass opacity into the right middle lobe. Appearance favors bacterial pneumonia pattern although pulmonary hemorrhage can have a similar appearance. We are attempting notification of the referring call service, and given the after hours timing, substantial right chest pain, and findings in the right lower lobe, technologist personnel are directing the patient to the emergency department. 2. Substantial improvement in the right breast lesions and right axillary adenopathy. 3. Mild density at the umbilicus is likely incidental, correlate with any tenderness along the umbilical region in assessing for omphalitis. 4. Absent right kidney with compensatory hypertrophy of the left kidney. 5. Stable scarring in the left kidney upper pole. 6. Left adrenal mass 1.7 by 2.0 cm, nonspecific but not hypermetabolic on 06/19/2022 hence most likely benign. Electronically Signed   By: Gaylyn Rong M.D.   On: 03/18/2023 17:17    Pending Labs Unresulted Labs (From admission, onward)     Start     Ordered   03/19/23 0011  Expectorated Sputum Assessment w Gram Stain, Rflx to Resp Cult  (COPD / Pneumonia / Cellulitis / Lower Extremity Wound)  Once,   R       Question Answer Comment  Patient immune status  Immunocompromised   Release to patient Immediate      03/19/23 0010   03/19/23 0011  Legionella Pneumophila Serogp 1 Ur Ag  (COPD / Pneumonia / Cellulitis / Lower Extremity Wound)  Once,   R        03/19/23 0010   03/19/23 0005  Blood Culture ID Panel (Reflexed)  Once,   STAT        03/19/23 0005   03/18/23 2348  Protime-INR  (Septic presentation on arrival (screening labs, nursing and treatment orders for obvious sepsis))  ONCE - STAT,  STAT        03/18/23 2348            Vitals/Pain Today's Vitals   03/19/23 0900 03/19/23 1130 03/19/23 1306 03/19/23 1330  BP: 102/84 110/87  120/79  Pulse: (!) 115 (!) 116  (!) 114  Resp: 18 18  18   Temp:   98.8 F (37.1 C)   TempSrc:   Oral   SpO2: 92% 92%  100%  Weight:      PainSc:        Isolation Precautions Droplet precaution  Medications Medications  ondansetron (ZOFRAN) injection 4 mg (0 mg Intravenous Hold 03/18/23 2343)  LORazepam (ATIVAN) tablet 1 mg (has no administration in time range)  heparin injection 5,000 Units (5,000 Units Subcutaneous Given 03/19/23 1315)  sodium chloride flush (NS) 0.9 % injection 3 mL (3 mLs Intravenous Given 03/19/23 0944)  sodium chloride flush (NS) 0.9 % injection 3 mL (has no administration in time range)  0.9 %  sodium chloride infusion (has no administration in time range)  0.9 %  sodium chloride infusion ( Intravenous Rate/Dose Change 03/19/23 0940)  HYDROcodone-acetaminophen (NORCO/VICODIN) 5-325 MG per tablet 1-2 tablet (1 tablet Oral Given 03/19/23 0612)  bisacodyl (DULCOLAX) EC tablet 5 mg (has no administration in time range)  ondansetron (ZOFRAN) tablet 4 mg (has no administration in time range)    Or  ondansetron (ZOFRAN) injection 4 mg (has no administration in time range)  levalbuterol (XOPENEX) nebulizer solution 0.63 mg (has no administration in time range)  senna-docusate (Senokot-S) tablet 2 tablet (2 tablets Oral Given 03/19/23 0940)  morphine (PF) 2 MG/ML injection 1 mg (has no  administration in time range)  cefTRIAXone (ROCEPHIN) 2 g in sodium chloride 0.9 % 100 mL IVPB (has no administration in time range)  guaiFENesin (MUCINEX) 12 hr tablet 1,200 mg (1,200 mg Oral Given 03/19/23 0939)  ipratropium-albuterol (DUONEB) 0.5-2.5 (3) MG/3ML nebulizer solution 3 mL (3 mLs Nebulization Given 03/19/23 1315)  morphine (PF) 4 MG/ML injection 6 mg (6 mg Intravenous Given 03/18/23 2329)  ondansetron (ZOFRAN) injection 4 mg (4 mg Intravenous Given 03/18/23 2339)  cefTRIAXone (ROCEPHIN) 2 g in sodium chloride 0.9 % 100 mL IVPB (0 g Intravenous Stopped 03/19/23 0049)  azithromycin (ZITHROMAX) 500 mg in sodium chloride 0.9 % 250 mL IVPB (0 mg Intravenous Stopped 03/19/23 0122)  acetaminophen (TYLENOL) tablet 650 mg (650 mg Oral Given 03/18/23 2359)  lidocaine-prilocaine (EMLA) cream (1 Application Topical Given 03/19/23 0155)  sodium chloride 0.9 % bolus 1,000 mL (0 mLs Intravenous Stopped 03/19/23 0939)  albumin human 25 % solution 50 g (0 g Intravenous Stopped 03/19/23 0939)  sodium chloride 0.9 % bolus 1,000 mL (0 mLs Intravenous Stopped 03/19/23 0939)  levalbuterol (XOPENEX) nebulizer solution 0.63 mg (0.63 mg Nebulization Given 03/19/23 0536)  potassium chloride SA (KLOR-CON M) CR tablet 20 mEq (20 mEq Oral Given 03/19/23 0748)  sodium chloride 0.9 % bolus 500 mL (0 mLs Intravenous Stopped 03/19/23 1046)    Mobility walks     Focused Assessments Pulmonary Assessment Handoff:  Lung sounds: Bilateral Breath Sounds: Diminished, Fine crackles L Breath Sounds: Diminished, Fine crackles R Breath Sounds: Diminished, Fine crackles O2 Device: Room Air      R Recommendations: See Admitting Provider Note  Report given to:   Additional Notes:

## 2023-03-19 NOTE — Progress Notes (Addendum)
   Patient's blood pressure has been dropped to 79/54, tachycardic and tachypneic.  O2 sat 96% room air.  Initially patient has mild leukocytosis and lactic acid within normal range.  Patient was 1 NS 125 cc/h - I have ordered 2 L of NS bolus and albumin 50 g.  No midodrine as patient is persistently tachycardic.  With fluid boluses and albumin blood pressure does not improve in that case will start vasopressor and reach out to ICU for consult. -Also broadening antibiotic to IV vancomycin and Zosyn given patient is immunocompromised.   Tereasa Coop, MD [facility] 03/19/2023, 5:17 AM

## 2023-03-20 ENCOUNTER — Other Ambulatory Visit: Payer: Self-pay | Admitting: Hematology and Oncology

## 2023-03-20 DIAGNOSIS — F17210 Nicotine dependence, cigarettes, uncomplicated: Secondary | ICD-10-CM | POA: Diagnosis not present

## 2023-03-20 DIAGNOSIS — J154 Pneumonia due to other streptococci: Secondary | ICD-10-CM

## 2023-03-20 DIAGNOSIS — A419 Sepsis, unspecified organism: Secondary | ICD-10-CM | POA: Diagnosis not present

## 2023-03-20 DIAGNOSIS — R7881 Bacteremia: Secondary | ICD-10-CM

## 2023-03-20 DIAGNOSIS — J189 Pneumonia, unspecified organism: Secondary | ICD-10-CM | POA: Diagnosis not present

## 2023-03-20 LAB — BASIC METABOLIC PANEL
Anion gap: 7 (ref 5–15)
BUN: 16 mg/dL (ref 6–20)
CO2: 21 mmol/L — ABNORMAL LOW (ref 22–32)
Calcium: 7.4 mg/dL — ABNORMAL LOW (ref 8.9–10.3)
Chloride: 112 mmol/L — ABNORMAL HIGH (ref 98–111)
Creatinine, Ser: 1 mg/dL (ref 0.44–1.00)
GFR, Estimated: >60 mL/min (ref >60)
Glucose, Bld: 101 mg/dL — ABNORMAL HIGH (ref 70–99)
Potassium: 3.1 mmol/L — ABNORMAL LOW (ref 3.5–5.1)
Sodium: 140 mmol/L (ref 135–145)

## 2023-03-20 LAB — CBC
HCT: 24.4 % — ABNORMAL LOW (ref 36.0–46.0)
Hemoglobin: 8.2 g/dL — ABNORMAL LOW (ref 12.0–15.0)
MCH: 32.3 pg (ref 26.0–34.0)
MCHC: 33.6 g/dL (ref 30.0–36.0)
MCV: 96.1 fL (ref 80.0–100.0)
Platelets: 216 10*3/uL (ref 150–400)
RBC: 2.54 MIL/uL — ABNORMAL LOW (ref 3.87–5.11)
RDW: 15.1 % (ref 11.5–15.5)
WBC: 21.5 10*3/uL — ABNORMAL HIGH (ref 4.0–10.5)
nRBC: 0 % (ref 0.0–0.2)

## 2023-03-20 LAB — LEGIONELLA PNEUMOPHILA SEROGP 1 UR AG: L. pneumophila Serogp 1 Ur Ag: NEGATIVE

## 2023-03-20 MED ORDER — POTASSIUM CHLORIDE CRYS ER 20 MEQ PO TBCR
20.0000 meq | EXTENDED_RELEASE_TABLET | Freq: Once | ORAL | Status: AC
  Administered 2023-03-20: 20 meq via ORAL
  Filled 2023-03-20: qty 1

## 2023-03-20 MED ORDER — POLYETHYLENE GLYCOL 3350 17 G PO PACK
17.0000 g | PACK | Freq: Every day | ORAL | Status: DC
  Administered 2023-03-21: 17 g via ORAL
  Filled 2023-03-20 (×6): qty 1

## 2023-03-20 MED ORDER — SODIUM CHLORIDE 0.9 % IV SOLN: INTRAVENOUS | Status: DC

## 2023-03-20 MED ORDER — POTASSIUM CHLORIDE CRYS ER 20 MEQ PO TBCR
40.0000 meq | EXTENDED_RELEASE_TABLET | Freq: Once | ORAL | Status: AC
  Administered 2023-03-20: 40 meq via ORAL
  Filled 2023-03-20: qty 2

## 2023-03-20 NOTE — Progress Notes (Signed)
HEMATOLOGY-ONCOLOGY PROGRESS NOTE  SUBJECTIVE: Hospitalization for severe right lower lobe pneumonia with possible sepsis  Oncology History  Breast cancer metastasized to multiple sites (HCC)  06/16/2020 Initial Diagnosis   [place]:  stage IV: T2 N1 M1 ER/PR negative HER2 positive metastatic breast cancer (diagnosed and treated by Dr. Welton Flakes) 08/09/2020 -11/02/2020 6 cycles of THP, on Herceptin and Perjeta maintenance Right breast DCIS   01/21/2023 -  Chemotherapy   Patient is on Treatment Plan : BREAST Trastuzumab + Pertuzumab q21d       OBJECTIVE: REVIEW OF SYSTEMS:   Constitutional: Anxious, upset for getting poked for blood tests. Right lower lobe chest pain  PHYSICAL EXAMINATION: ECOG PERFORMANCE STATUS: 3 - Symptomatic, >50% confined to bed  Vitals:   03/20/23 0946 03/20/23 0952  BP:  119/87  Pulse:  (!) 102  Resp:    Temp:  99 F (37.2 C)  SpO2: 98% 98%   Filed Weights   03/18/23 1757 03/19/23 0228 03/20/23 0407  Weight: 123 lb 7.3 oz (56 kg) 123 lb 7.3 oz (56 kg) 131 lb 1.6 oz (59.5 kg)    GENERAL:alert, no distress and comfortable SKIN: skin color, texture, turgor are normal, no rashes or significant lesions EYES: normal, Conjunctiva are pink and non-injected, sclera clear OROPHARYNX:no exudate, no erythema and lips, buccal mucosa, and tongue normal  NECK: supple, thyroid normal size, non-tender, without nodularity LYMPH:  no palpable lymphadenopathy in the cervical, axillary or inguinal LUNGS: Diminished breath sounds right lung base HEART: regular rate & rhythm and no murmurs and no lower extremity edema ABDOMEN:abdomen soft, non-tender and normal bowel sounds Musculoskeletal:no cyanosis of digits and no clubbing  NEURO: alert & oriented x 3 with fluent speech, no focal motor/sensory deficits  LABORATORY DATA:  I have reviewed the data as listed    Latest Ref Rng & Units 03/20/2023    3:47 AM 03/19/2023    4:44 AM 03/18/2023    7:31 PM  CMP  Glucose 70 -  99 mg/dL 161  94  096   BUN 6 - 20 mg/dL 16  22  20    Creatinine 0.44 - 1.00 mg/dL 0.45  4.09  8.11   Sodium 135 - 145 mmol/L 140  135  134   Potassium 3.5 - 5.1 mmol/L 3.1  3.3  3.6   Chloride 98 - 111 mmol/L 112  107  103   CO2 22 - 32 mmol/L 21  21  22    Calcium 8.9 - 10.3 mg/dL 7.4  7.6  8.4   Total Protein 6.5 - 8.1 g/dL  6.6  8.1   Total Bilirubin 0.0 - 1.2 mg/dL  0.4  0.7   Alkaline Phos 38 - 126 U/L  51  62   AST 15 - 41 U/L  14  20   ALT 0 - 44 U/L  12  15     Lab Results  Component Value Date   WBC 21.5 (H) 03/20/2023   HGB 8.2 (L) 03/20/2023   HCT 24.4 (L) 03/20/2023   MCV 96.1 03/20/2023   PLT 216 03/20/2023   NEUTROABS 3.3 01/21/2023    ASSESSMENT AND PLAN: 1.  Right lower lobe pneumonia: Streptococcus pneumoniae.  Currently on IV antibiotics.  Blood cultures have just been drawn. The extent of pneumonia appears to be far worse than the patient's symptoms. This would be worthwhile to get pulmonary input. 2. metastatic breast cancer: Currently on Herceptin and Perjeta.  CT CAP 03/18/2023: Consolidation right lower lobe, substantial improvement in  the right breast lesion and right axillary lymphadenopathy It is for this reason I do not believe that the lung findings are related to metastatic breast cancer.  It is possible that it may be pulmonary hemorrhage. 3.  Anemia: Normocytic

## 2023-03-20 NOTE — TOC Initial Note (Signed)
Transition of Care [facility]) - Initial/Assessment Note    Patient Details  Name: LLADIRA BARRINGTON MRN: [id number] Date of Birth: 1985/05/11  Transition of Care [facility]) CM/SW Contact:    Lanier Clam, RN Phone Number: 03/20/2023, 1:20 PM  Clinical Narrative: d/c plan home.                  Expected Discharge Plan: Home/Self Care Barriers to Discharge: Continued Medical Work up   Patient Goals and CMS Choice            Expected Discharge Plan and Services                                              Prior Living Arrangements/Services                       Activities of Daily Living   ADL Screening (condition at time of admission) Independently performs ADLs?: Yes (appropriate for developmental age) Is the patient deaf or have difficulty hearing?: No Does the patient have difficulty seeing, even when wearing glasses/contacts?: No Does the patient have difficulty concentrating, remembering, or making decisions?: No  Permission Sought/Granted                  Emotional Assessment              Admission diagnosis:  CAP (community acquired pneumonia) [J18.9] Community acquired pneumonia of right lower lobe of lung [J18.9] Patient Active Problem List   Diagnosis Date Noted   Sepsis due to pneumonia (HCC) 03/19/2023   AKI (acute kidney injury) (HCC) 03/19/2023   History of breast cancer 03/19/2023   GAD (generalized anxiety disorder) 03/19/2023   CAP (community acquired pneumonia) 03/19/2023   Breast cancer metastasized to multiple sites (HCC) 08/21/2022   Cellulitis of pharynx    Pharyngitis 04/20/2015   Postpartum care and examination 12/27/2013   Contraception management 12/27/2013   GBS bacteriuria 10/14/2013   Drug use complicating pregnancy in second trimester 07/06/2013   Hx of pyelonephritis 06/08/2013   HSV-2 infection complicating pregnancy 06/08/2013   SOLITARY KIDNEY 05/28/2006   ANA POSITIVE, HX OF 05/02/2006   PCP:   Default, Provider, MD Pharmacy:   [facility] [phone] [place], [place] - [address] [place] [address] Phone: [phone] Fax: [phone]  [id number] - [place], [place] - [address] [place] [address] Phone: [phone] Fax: [phone]     Social Drivers of Health (SDOH) Social History: SDOH Screenings   Food Insecurity: No Food Insecurity (03/19/2023)  Recent Concern: Food Insecurity - Food Insecurity Present (01/21/2023)  Housing: Low Risk  (03/19/2023)  Recent Concern: Housing - Medium Risk (01/21/2023)  Transportation Needs: No Transportation Needs (03/19/2023)  Recent Concern: Transportation Needs - Unmet Transportation Needs (01/21/2023)  Utilities: Not At Risk (03/19/2023)  Social Connections: Unknown (03/19/2023)  Tobacco Use: High Risk (03/19/2023)   SDOH Interventions:     Readmission Risk Interventions     No data to display

## 2023-03-20 NOTE — Progress Notes (Signed)
PROGRESS NOTE    Connie Wells  XBJ:[id number] DOB: 1985/12/06 DOA: 03/18/2023 PCP: Default, Provider, MD   Brief Narrative: 38 year old with past medical history significant for breast cancer on Herceptin maintenance therapy, pulmonary nodule, liver lesion, generalized anxiety disorder present complaining of right side rib pain, intermittent associated with shortness of breath.  Evaluation in the ED she was found to be febrile temperature 102, chest x-ray dense dense consolidation within the right lung base.  CT chest new dense consolidation of margin of the right lower lobe with some sparing in the superior segment but multiple segmental involvement.  Appearance favor bacterial pneumonia pattern although pulmonary hemorrhage can have similar appearance.   Assessment & Plan:   Principal Problem:   Sepsis due to pneumonia [facility]) Active Problems:   History of breast cancer   AKI (acute kidney injury) (HCC)   GAD (generalized anxiety disorder)   CAP (community acquired pneumonia)   1-Sepsis secondary to Strep Pneumoniae Pneumonia -Patient presented with fever temperature 102, tachycardiac heart rate 126, tachypnea respiration rate 33, hypotension, source of infection pneumonia.  CT chest with finding consistent with consolidation of the right lower lobe with some spurring in the superior segment. -Strep pneumonia antigen positive -Continue with IV ceftriaxone 2 g -Blood cultures x 2 Strep Pneumoniae.  Continue with IV ceftriaxone -Continue with guaifenesin as needed nebulizers -Denies hemoptysis -Monitor WBC, increased today. Afebrile. Continue with current antibiotics.   Strep Pneumoniae bacteremia:  -Continue with IV ceftriaxone.  -ID consulted.   Right-sided chest wall pain secondary to pneumonia CT chest:New dense consolidation of much of the right lower lobe with some sparing in the superior segment but multi segmental involvement otherwise. Minimal spillover ground-glass  opacity into the right middle lobe. Appearance favors bacterial pneumonia pattern although pulmonary hemorrhage can have a similar appearance.  -IV ceftriaxone  AKI: -Creatinine baseline 0.9 -Creatinine peaked to 1.2.  Continue with IV fluids  Generalized anxiety disorder As needed Ativan  Metastatic breast cancer: Patient has breast cancer with metastasis to multiple side currently on maintenance therapy with Herceptin and Perjeta. -Added Dr. Pamelia Hoit on the treatment team  Hypokalemia: Replete     Estimated body mass index is 21.16 kg/m as calculated from the following:   Height as of this encounter: 5\' 6"  (1.676 m).   Weight as of this encounter: 59.5 kg.   DVT prophylaxis: Heparin Code Status: Full code Family Communication: Care discussed with patient Disposition Plan:  Status is: Inpatient Remains inpatient appropriate because: Management of pneumonia bacteremia    Consultants:  None  Procedures:  none  Antimicrobials:    Subjective: Still coughing, having SOB, but feels a little better.   Objective: Vitals:   03/19/23 2059 03/20/23 0012 03/20/23 0407 03/20/23 0408  BP:  110/70  114/77  Pulse:  (!) 107  (!) 105  Resp: (!) 21 18  (!) 23  Temp:  98.5 F (36.9 C)  98.4 F (36.9 C)  TempSrc:    Oral  SpO2:  100%  100%  Weight:   59.5 kg   Height:   5\' 6"  (1.676 m)     Intake/Output Summary (Last 24 hours) at 03/20/2023 0718 Last data filed at 03/20/2023 0100 Gross per 24 hour  Intake 2717 ml  Output --  Net 2717 ml   Filed Weights   03/18/23 1757 03/19/23 0228 03/20/23 0407  Weight: 56 kg 56 kg 59.5 kg    Examination:  General exam: NAD Respiratory system: BL ronchus Cardiovascular system: S 1,  S 2 RRR Gastrointestinal system: BS present, soft nt Central nervous system: Alert Extremities: No edema   Data Reviewed: I have personally reviewed following labs and imaging studies  CBC: Recent Labs  Lab 03/18/23 1931 03/19/23 0444  03/20/23 0347  WBC 10.7* 8.6 21.5*  HGB 11.4* 9.1* 8.2*  HCT 33.2* 27.0* 24.4*  MCV 94.6 96.1 96.1  PLT 266 245 216   Basic Metabolic Panel: Recent Labs  Lab 03/18/23 1931 03/19/23 0444 03/20/23 0347  NA 134* 135 140  K 3.6 3.3* 3.1*  CL 103 107 112*  CO2 22 21* 21*  GLUCOSE 108* 94 101*  BUN 20 22* 16  CREATININE 1.18* 1.23* 1.00  CALCIUM 8.4* 7.6* 7.4*   GFR: Estimated Creatinine Clearance: 72.8 mL/min (by C-G formula based on SCr of 1 mg/dL). Liver Function Tests: Recent Labs  Lab 03/18/23 1931 03/19/23 0444  AST 20 14*  ALT 15 12  ALKPHOS 62 51  BILITOT 0.7 0.4  PROT 8.1 6.6  ALBUMIN 2.7* 2.1*   No results for input(s): "LIPASE", "AMYLASE" in the last 168 hours. No results for input(s): "AMMONIA" in the last 168 hours. Coagulation Profile: Recent Labs  Lab 03/19/23 0018  INR 1.1   Cardiac Enzymes: No results for input(s): "CKTOTAL", "CKMB", "CKMBINDEX", "TROPONINI" in the last 168 hours. BNP (last 3 results) No results for input(s): "PROBNP" in the last 8760 hours. HbA1C: No results for input(s): "HGBA1C" in the last 72 hours. CBG: No results for input(s): "GLUCAP" in the last 168 hours. Lipid Profile: No results for input(s): "CHOL", "HDL", "LDLCALC", "TRIG", "CHOLHDL", "LDLDIRECT" in the last 72 hours. Thyroid Function Tests: No results for input(s): "TSH", "T4TOTAL", "FREET4", "T3FREE", "THYROIDAB" in the last 72 hours. Anemia Panel: No results for input(s): "VITAMINB12", "FOLATE", "FERRITIN", "TIBC", "IRON", "RETICCTPCT" in the last 72 hours. Sepsis Labs: Recent Labs  Lab 03/19/23 0016 03/19/23 0556  LATICACIDVEN 0.9 1.6    Recent Results (from the past 240 hours)  Blood Culture (routine x 2)     Status: None (Preliminary result)   Collection Time: 03/19/23 12:05 AM   Specimen: BLOOD  Result Value Ref Range Status   Specimen Description   Final    BLOOD SITE NOT SPECIFIED Performed at [facility], [address], [place], [place] [address]    Special Requests   Final    BOTTLES DRAWN AEROBIC AND ANAEROBIC Blood Culture adequate volume Performed at [facility], [address], [place], [place] [address]    Culture  Setup Time   Final    GRAM POSITIVE COCCI IN CHAINS IN BOTH AEROBIC AND ANAEROBIC BOTTLES CRITICAL RESULT CALLED TO, READ BACK BY AND VERIFIED WITH: PHARMD MELISSA JAMES ON 03/19/23 @ 1601 BY DRT Performed at [facility], [address], [place], [place] [address]    Culture GRAM POSITIVE COCCI  Final   Report Status PENDING  Incomplete  Blood Culture ID Panel (Reflexed)     Status: Abnormal   Collection Time: 03/19/23 12:05 AM  Result Value Ref Range Status   Enterococcus faecalis NOT DETECTED NOT DETECTED Final   Enterococcus Faecium NOT DETECTED NOT DETECTED Final   Listeria monocytogenes NOT DETECTED NOT DETECTED Final   Staphylococcus species NOT DETECTED NOT DETECTED Final   Staphylococcus aureus (BCID) NOT DETECTED NOT DETECTED Final   Staphylococcus epidermidis NOT DETECTED NOT DETECTED Final   Staphylococcus lugdunensis NOT DETECTED NOT DETECTED Final   Streptococcus species DETECTED (A) NOT DETECTED Final  Comment: CRITICAL RESULT CALLED TO, READ BACK BY AND VERIFIED WITH: PHARMD MELISSA JAMES [id number] AT 1601, DRT    Streptococcus agalactiae NOT DETECTED NOT DETECTED Final   Streptococcus pneumoniae DETECTED (A) NOT DETECTED Final    Comment: CRITICAL RESULT CALLED TO, READ BACK BY AND VERIFIED WITH: PHARMD MELISSA JAMES [id number] AT 1601, DRT    Streptococcus pyogenes NOT DETECTED NOT DETECTED Final   A.calcoaceticus-baumannii NOT DETECTED NOT DETECTED Final   Bacteroides fragilis NOT DETECTED NOT DETECTED Final   Enterobacterales NOT DETECTED NOT DETECTED Final   Enterobacter cloacae complex NOT DETECTED NOT DETECTED Final   Escherichia coli NOT DETECTED NOT DETECTED Final   Klebsiella aerogenes NOT DETECTED NOT DETECTED Final   Klebsiella  oxytoca NOT DETECTED NOT DETECTED Final   Klebsiella pneumoniae NOT DETECTED NOT DETECTED Final   Proteus species NOT DETECTED NOT DETECTED Final   Salmonella species NOT DETECTED NOT DETECTED Final   Serratia marcescens NOT DETECTED NOT DETECTED Final   Haemophilus influenzae NOT DETECTED NOT DETECTED Final   Neisseria meningitidis NOT DETECTED NOT DETECTED Final   Pseudomonas aeruginosa NOT DETECTED NOT DETECTED Final   Stenotrophomonas maltophilia NOT DETECTED NOT DETECTED Final   Candida albicans NOT DETECTED NOT DETECTED Final   Candida auris NOT DETECTED NOT DETECTED Final   Candida glabrata NOT DETECTED NOT DETECTED Final   Candida krusei NOT DETECTED NOT DETECTED Final   Candida parapsilosis NOT DETECTED NOT DETECTED Final   Candida tropicalis NOT DETECTED NOT DETECTED Final   Cryptococcus neoformans/gattii NOT DETECTED NOT DETECTED Final    Comment: Performed at [facility], [address] [place], [place] [address]  Blood Culture (routine x 2)     Status: None (Preliminary result)   Collection Time: 03/19/23 12:27 AM   Specimen: BLOOD RIGHT FOREARM  Result Value Ref Range Status   Specimen Description   Final    BLOOD RIGHT FOREARM Performed at [facility], [address] [place], [place] [address]    Special Requests   Final    BOTTLES DRAWN AEROBIC AND ANAEROBIC Blood Culture results may not be optimal due to an inadequate volume of blood received in culture bottles Performed at [facility], [address], [place], [place] [address]    Culture  Setup Time   Final    GRAM POSITIVE COCCI IN CHAINS IN BOTH AEROBIC AND ANAEROBIC BOTTLES Performed at [facility], [address], [place], [place] [address]    Culture GRAM POSITIVE COCCI IN CHAINS  Final   Report Status PENDING  Incomplete  Respiratory (~20 pathogens) panel by PCR     Status: None   Collection Time: 03/19/23  1:05 AM   Specimen: Nasopharyngeal Swab; Respiratory   Result Value Ref Range Status   Adenovirus NOT DETECTED NOT DETECTED Final   Coronavirus 229E NOT DETECTED NOT DETECTED Final    Comment: (NOTE) The Coronavirus on the Respiratory Panel, DOES NOT test for the novel  Coronavirus (2019 nCoV)    Coronavirus HKU1 NOT DETECTED NOT DETECTED Final   Coronavirus NL63 NOT DETECTED NOT DETECTED Final   Coronavirus OC43 NOT DETECTED NOT DETECTED Final   Metapneumovirus NOT DETECTED NOT DETECTED Final   Rhinovirus / Enterovirus NOT DETECTED NOT DETECTED Final   Influenza A NOT DETECTED NOT DETECTED Final   Influenza B NOT DETECTED NOT DETECTED Final   Parainfluenza Virus 1 NOT DETECTED NOT DETECTED Final   Parainfluenza Virus 2 NOT DETECTED NOT DETECTED Final  Parainfluenza Virus 3 NOT DETECTED NOT DETECTED Final   Parainfluenza Virus 4 NOT DETECTED NOT DETECTED Final   Respiratory Syncytial Virus NOT DETECTED NOT DETECTED Final   Bordetella pertussis NOT DETECTED NOT DETECTED Final   Bordetella Parapertussis NOT DETECTED NOT DETECTED Final   Chlamydophila pneumoniae NOT DETECTED NOT DETECTED Final   Mycoplasma pneumoniae NOT DETECTED NOT DETECTED Final    Comment: Performed at [facility], [address], [place], [place] [address]         Radiology Studies: DG Chest Port 1 View Result Date: 03/18/2023 CLINICAL DATA:  Pneumonia EXAM: PORTABLE CHEST 1 VIEW COMPARISON:  None Available. FINDINGS: There is dense consolidation within the right lung base, in keeping changes of acute lobar pneumonia in the appropriate clinical setting. Small right pleural effusion may be present. No pneumothorax. No pleural effusion on the left. Left lung is clear. Cardiac size within normal limits. Left internal jugular port tip seen within the superior vena cava. Pulmonary vascularity is normal. Suspected right hilar adenopathy. No acute bone abnormality. IMPRESSION: 1. Dense consolidation within the right lung base, in keeping changes of acute lobar  pneumonia in the appropriate clinical setting. Radiographic follow-up to resolution is recommended. 2. Suspected right hilar adenopathy. Electronically Signed   By: Helyn Numbers M.D.   On: 03/18/2023 22:09   CT CHEST ABDOMEN PELVIS W CONTRAST Result Date: 03/18/2023 CLINICAL DATA:  Breast cancer restaging.  Right chest pain. * Tracking Code: BO * EXAM: CT CHEST, ABDOMEN, AND PELVIS WITH CONTRAST TECHNIQUE: Multidetector CT imaging of the chest, abdomen and pelvis was performed following the standard protocol during bolus administration of intravenous contrast. RADIATION DOSE REDUCTION: This exam was performed according to the departmental dose-optimization program which includes automated exposure control, adjustment of the mA and/or kV according to patient size and/or use of iterative reconstruction technique. CONTRAST:  OMNIPAQUE IOHEXOL 300 MG/ML  SOLN COMPARISON:  11/27/2022 FINDINGS: CT CHEST FINDINGS Cardiovascular: Left Port-A-Cath tip: SVC. Mediastinum/Nodes: Clustered right axillary lymph nodes including an index node measuring 1.1 cm in short axis on image 11 series 2, improved from prior exam where this measured 1.7 cm. Small left axillary and bilateral subpectoral lymph nodes are present. Subglandular lesion in the right breast potentially with associated calcification measuring 2.1 by 1.2 cm on image 33 series 2, formerly 2.8 by 1.3 cm. The more medial solid breast nodule measures about 0.9 cm in diameter on image 37 series 2, previously 2.0 by 2.0 cm. This represents a substantial improvement. Lungs/Pleura: New dense consolidation of much of the right lower lobe with some sparing in the superior segment but multi segmental involvement otherwise. Minimal spillover ground-glass opacity into the right middle lobe for example on image 83 series 4. Appearance favors bacterial pneumonia pattern although pulmonary hemorrhage can have a similar appearance. Musculoskeletal: Unremarkable CT ABDOMEN  PELVIS FINDINGS Hepatobiliary: Unremarkable Pancreas: Unremarkable Spleen: Unremarkable Adrenals/Urinary Tract: Stable scarring in the left kidney upper pole. Absent right kidney with compensatory hypertrophy of the left kidney. Left adrenal mass 1.7 by 2.0 cm on image 54 series 2, nonspecific but not hypermetabolic on 06/19/2022 hence most likely benign. Stomach/Bowel: Unremarkable Vascular/Lymphatic: Unremarkable Reproductive: Retroverted uterus.  Otherwise unremarkable. Other: Mild density at the umbilicus is likely incidental, correlate with any tenderness along the umbilical region in assessing for omphalitis. Musculoskeletal: Unremarkable IMPRESSION: 1. New dense consolidation of much of the right lower lobe with some sparing in the superior segment but multi segmental involvement otherwise. Minimal spillover ground-glass opacity into  the right middle lobe. Appearance favors bacterial pneumonia pattern although pulmonary hemorrhage can have a similar appearance. We are attempting notification of the referring call service, and given the after hours timing, substantial right chest pain, and findings in the right lower lobe, technologist personnel are directing the patient to the emergency department. 2. Substantial improvement in the right breast lesions and right axillary adenopathy. 3. Mild density at the umbilicus is likely incidental, correlate with any tenderness along the umbilical region in assessing for omphalitis. 4. Absent right kidney with compensatory hypertrophy of the left kidney. 5. Stable scarring in the left kidney upper pole. 6. Left adrenal mass 1.7 by 2.0 cm, nonspecific but not hypermetabolic on 06/19/2022 hence most likely benign. Electronically Signed   By: Gaylyn Rong M.D.   On: 03/18/2023 17:17        Scheduled Meds:  Chlorhexidine Gluconate Cloth  6 each Topical Daily   guaiFENesin  1,200 mg Oral BID   heparin  5,000 Units Subcutaneous Q8H   ipratropium-albuterol  3  mL Nebulization TID   ondansetron (ZOFRAN) IV  4 mg Intravenous Once   potassium chloride  40 mEq Oral Once   senna-docusate  2 tablet Oral BID   sodium chloride flush  3 mL Intravenous Q12H   Continuous Infusions:  sodium chloride     cefTRIAXone (ROCEPHIN)  IV 2 g (03/20/23 0009)     LOS: 1 day    Time spent: 35 minutes    Angelo Prindle A Drayke Grabel, MD [facility]   If 7PM-7AM, please contact night-coverage [email or web address]  03/20/2023, 7:18 AM

## 2023-03-21 ENCOUNTER — Inpatient Hospital Stay (HOSPITAL_COMMUNITY): Payer: Medicaid Other

## 2023-03-21 ENCOUNTER — Telehealth: Payer: Self-pay | Admitting: Internal Medicine

## 2023-03-21 DIAGNOSIS — R7881 Bacteremia: Secondary | ICD-10-CM | POA: Diagnosis not present

## 2023-03-21 DIAGNOSIS — J154 Pneumonia due to other streptococci: Secondary | ICD-10-CM | POA: Diagnosis not present

## 2023-03-21 DIAGNOSIS — A419 Sepsis, unspecified organism: Secondary | ICD-10-CM | POA: Diagnosis not present

## 2023-03-21 DIAGNOSIS — I38 Endocarditis, valve unspecified: Secondary | ICD-10-CM

## 2023-03-21 DIAGNOSIS — J189 Pneumonia, unspecified organism: Secondary | ICD-10-CM | POA: Diagnosis not present

## 2023-03-21 DIAGNOSIS — F17210 Nicotine dependence, cigarettes, uncomplicated: Secondary | ICD-10-CM | POA: Diagnosis not present

## 2023-03-21 LAB — IRON AND TIBC
Iron: 62 ug/dL (ref 28–170)
Saturation Ratios: 30 % (ref 10.4–31.8)
TIBC: 206 ug/dL — ABNORMAL LOW (ref 250–450)
UIBC: 144 ug/dL

## 2023-03-21 LAB — CBC
HCT: 22.9 % — ABNORMAL LOW (ref 36.0–46.0)
Hemoglobin: 7.6 g/dL — ABNORMAL LOW (ref 12.0–15.0)
MCH: 32.6 pg (ref 26.0–34.0)
MCHC: 33.2 g/dL (ref 30.0–36.0)
MCV: 98.3 fL (ref 80.0–100.0)
Platelets: 253 10*3/uL (ref 150–400)
RBC: 2.33 MIL/uL — ABNORMAL LOW (ref 3.87–5.11)
RDW: 15.5 % (ref 11.5–15.5)
WBC: 17.7 10*3/uL — ABNORMAL HIGH (ref 4.0–10.5)
nRBC: 0 % (ref 0.0–0.2)

## 2023-03-21 LAB — RETICULOCYTES
Immature Retic Fract: 10.5 % (ref 2.3–15.9)
RBC.: 2.49 MIL/uL — ABNORMAL LOW (ref 3.87–5.11)
Retic Count, Absolute: 11.8 10*3/uL — ABNORMAL LOW (ref 19.0–186.0)
Retic Ct Pct: 0.5 % (ref 0.4–3.1)

## 2023-03-21 LAB — ECHOCARDIOGRAM COMPLETE
Area-P 1/2: 5.13 cm2
Calc EF: 42.7 %
S' Lateral: 3.4 cm
Single Plane A2C EF: 42.3 %
Single Plane A4C EF: 45.1 %

## 2023-03-21 LAB — CULTURE, BLOOD (ROUTINE X 2): Special Requests: ADEQUATE

## 2023-03-21 LAB — BASIC METABOLIC PANEL
Anion gap: 7 (ref 5–15)
BUN: 13 mg/dL (ref 6–20)
CO2: 19 mmol/L — ABNORMAL LOW (ref 22–32)
Calcium: 7.5 mg/dL — ABNORMAL LOW (ref 8.9–10.3)
Chloride: 109 mmol/L (ref 98–111)
Creatinine, Ser: 0.79 mg/dL (ref 0.44–1.00)
GFR, Estimated: >60 mL/min (ref >60)
Glucose, Bld: 113 mg/dL — ABNORMAL HIGH (ref 70–99)
Potassium: 3.4 mmol/L — ABNORMAL LOW (ref 3.5–5.1)
Sodium: 135 mmol/L (ref 135–145)

## 2023-03-21 LAB — VITAMIN B12: Vitamin B-12: 1029 pg/mL — ABNORMAL HIGH (ref 180–914)

## 2023-03-21 LAB — FERRITIN: Ferritin: 118 ng/mL (ref 11–307)

## 2023-03-21 LAB — MAGNESIUM: Magnesium: 1.7 mg/dL (ref 1.7–2.4)

## 2023-03-21 LAB — FOLATE: Folate: 5.7 ng/mL — ABNORMAL LOW (ref >5.9)

## 2023-03-21 MED ORDER — FOLIC ACID 1 MG PO TABS
1.0000 mg | ORAL_TABLET | Freq: Every day | ORAL | Status: DC
  Administered 2023-03-21 – 2023-03-25 (×5): 1 mg via ORAL
  Filled 2023-03-21 (×5): qty 1

## 2023-03-21 MED ORDER — MELATONIN 3 MG PO TABS
3.0000 mg | ORAL_TABLET | Freq: Every evening | ORAL | Status: DC | PRN
  Administered 2023-03-21 – 2023-03-23 (×3): 3 mg via ORAL
  Filled 2023-03-21 (×3): qty 1

## 2023-03-21 MED ORDER — IPRATROPIUM-ALBUTEROL 0.5-2.5 (3) MG/3ML IN SOLN
3.0000 mL | Freq: Two times a day (BID) | RESPIRATORY_TRACT | Status: DC
Start: 2023-03-21 — End: 2023-03-25
  Administered 2023-03-21 – 2023-03-24 (×7): 3 mL via RESPIRATORY_TRACT
  Filled 2023-03-21 (×7): qty 3

## 2023-03-21 MED ORDER — POTASSIUM CHLORIDE CRYS ER 20 MEQ PO TBCR
40.0000 meq | EXTENDED_RELEASE_TABLET | ORAL | Status: AC
  Administered 2023-03-21 (×2): 40 meq via ORAL
  Filled 2023-03-21 (×2): qty 2

## 2023-03-21 MED ORDER — MAGNESIUM SULFATE 2 GM/50ML IV SOLN
2.0000 g | Freq: Once | INTRAVENOUS | Status: AC
  Administered 2023-03-21: 2 g via INTRAVENOUS
  Filled 2023-03-21: qty 50

## 2023-03-21 MED ORDER — DEXTROSE 5 % IV SOLN
12.0000 10*6.[IU] | Freq: Two times a day (BID) | INTRAVENOUS | Status: DC
  Administered 2023-03-21 – 2023-03-24 (×7): 12 10*6.[IU] via INTRAVENOUS
  Filled 2023-03-21 (×9): qty 12

## 2023-03-21 NOTE — Progress Notes (Signed)
  Echocardiogram 2D Echocardiogram has been performed.  Janalyn Harder 03/21/2023, 2:29 PM

## 2023-03-21 NOTE — Consult Note (Signed)
NAME:  Connie Wells, MRN:  [id number], DOB:  Aug 02, 1985, LOS: 2 ADMISSION DATE:  03/18/2023, CONSULTATION DATE:  1/24 REFERRING MD:  Sunnie Nielsen , CHIEF COMPLAINT:  abnormal CT chest    History of Present Illness:  38 year old with stage IV breast cancer presented to the emergency room on 1/21 with chief complaint of right sided rib discomfort and intermittent shortness of breath which had been ongoing for about 4 to 5 days the pain was initially tolerable but progressed, also noted worsening right sided chest discomfort with movement, deep breath, laughing or coughing.  She denied any fever or productivity of her cough she had no swelling in her legs a chest x-ray was obtained that showed right basilar airspace disease with consolidative changes and a follow-up CT of chest was obtained showing a very dense right lower lobe consolidated lobar pneumonia with some associated groundglass changes.  She was febrile on presentation to the emergency room. She was admitted, cultures were obtained, she was started on broad-spectrum antibiotics.  Over the course of her hospitalization the following diagnostic results have been obtained: Blood cultures were positive for strep pneumoniae HIV was nonreactive, respiratory viral panel was negative urinary strep antigen was positive.  She has been seen by infectious disease because of her bacteremia with planned further evaluation in the form of TTE and subsequent possible TEE.  She was changed from ceftriaxone to penicillin.  Because of mention about radiographic findings that could represent pneumonia versus pulmonary hemorrhage we have been asked to comment  Pertinent  Medical History  Breast cancer stage IV, mets to liver and lung, post THP, Herceptin and Perjeta.  Currently on trastuzumab and pertuzumab q. 21 days Solitary kidney Significant Hospital Events: Including procedures, antibiotic start and stop dates in addition to other pertinent events   1/21  admitted  1/24 pulm consulted   Interim History / Subjective:  Breathing a little easier  Objective   Blood pressure (!) 131/97, pulse 90, temperature 97.8 F (36.6 C), temperature source Oral, resp. rate 18, height 5\' 6"  (1.676 m), weight 59.5 kg, last menstrual period 03/17/2023, SpO2 99%.        Intake/Output Summary (Last 24 hours) at 03/21/2023 1337 Last data filed at 03/21/2023 0600 Gross per 24 hour  Intake 2305.02 ml  Output --  Net 2305.02 ml   Filed Weights   03/18/23 1757 03/19/23 0228 03/20/23 0407  Weight: 56 kg 56 kg 59.5 kg    Examination: General: 38 year old female resting in bed no distress  HENT: NCAT no JVD  Lungs: decreased right base no accessory use Cardiovascular: RRR Abdomen: soft Extremities: warm  Neuro: oriented  GU: not assessed  Resolved Hospital Problem list     Assessment & Plan:  Strep pneumonia involving the right lower lobe Strep bacteremia Fluid and electrolyte imbalance: Hypokalemia Nonanion gap metabolic acidosis Anemia  Pulm Problem list Right lower lobe strep pneumonia with associated strep bacteremia  Plan Would continue current antibiotics as outlined by infectious disease Follow-up CT chest in 4 to 6 weeks If continued to have groundglass changes after resolution of pneumonia and also hemoptysis could then consider further diagnostic evaluation such as bronchial alveolar lavage, but at this point would treat as pneumonia  Best Practice (right click and "Reselect all SmartList Selections" daily)   Per primary   Labs   CBC: Recent Labs  Lab 03/18/23 1931 03/19/23 0444 03/20/23 0347 03/21/23 0102  WBC 10.7* 8.6 21.5* 17.7*  HGB 11.4* 9.1* 8.2* 7.6*  HCT 33.2* 27.0* 24.4* 22.9*  MCV 94.6 96.1 96.1 98.3  PLT 266 245 216 253    Basic Metabolic Panel: Recent Labs  Lab 03/18/23 1931 03/19/23 0444 03/20/23 0347 03/21/23 0102  NA 134* 135 140 135  K 3.6 3.3* 3.1* 3.4*  CL 103 107 112* 109  CO2 22 21* 21*  19*  GLUCOSE 108* 94 101* 113*  BUN 20 22* 16 13  CREATININE 1.18* 1.23* 1.00 0.79  CALCIUM 8.4* 7.6* 7.4* 7.5*  MG  --   --   --  1.7   GFR: Estimated Creatinine Clearance: 91 mL/min (by C-G formula based on SCr of 0.79 mg/dL). Recent Labs  Lab 03/18/23 1931 03/19/23 0016 03/19/23 0444 03/19/23 0556 03/20/23 0347 03/21/23 0102  WBC 10.7*  --  8.6  --  21.5* 17.7*  LATICACIDVEN  --  0.9  --  1.6  --   --     Liver Function Tests: Recent Labs  Lab 03/18/23 1931 03/19/23 0444  AST 20 14*  ALT 15 12  ALKPHOS 62 51  BILITOT 0.7 0.4  PROT 8.1 6.6  ALBUMIN 2.7* 2.1*   No results for input(s): "LIPASE", "AMYLASE" in the last 168 hours. No results for input(s): "AMMONIA" in the last 168 hours.  ABG    Component Value Date/Time   TCO2 26 04/20/2015 1503     Coagulation Profile: Recent Labs  Lab 03/19/23 0018  INR 1.1    Cardiac Enzymes: No results for input(s): "CKTOTAL", "CKMB", "CKMBINDEX", "TROPONINI" in the last 168 hours.  HbA1C: No results found for: "HGBA1C"  CBG: No results for input(s): "GLUCAP" in the last 168 hours.  Review of Systems:   Review of Systems  Constitutional:  Positive for malaise/fatigue.  HENT: Negative.    Eyes: Negative.   Respiratory:  Positive for cough, sputum production and shortness of breath.   Cardiovascular:  Positive for chest pain.  Gastrointestinal: Negative.   Genitourinary: Negative.   Musculoskeletal: Negative.   Skin: Negative.   Neurological: Negative.   Endo/Heme/Allergies: Negative.      Past Medical History:  She,  has a past medical history of Herpes genitalia and Solitary kidney.   Surgical History:   Past Surgical History:  Procedure Laterality Date   DILATION AND EVACUATION     ECTOPIC PREGNANCY SURGERY       Social History:   reports that she has been smoking cigarettes. She has a 2.5 pack-year smoking history. She has never used smokeless tobacco. She reports current drug use. Drug:  Marijuana. She reports that she does not drink alcohol.   Family History:  Her family history includes Asthma in her brother; Diabetes in her maternal aunt and mother; Hypertension in her mother.   Allergies No Known Allergies   Home Medications  Prior to Admission medications   Medication Sig Start Date End Date Taking? Authorizing Provider  ibuprofen (ADVIL) 200 MG tablet Take 200 mg by mouth every 6 (six) hours as needed.   Yes [provider]  lidocaine-prilocaine (EMLA) cream Apply to affected area once Patient not taking: Reported on 01/21/2023 12/30/22   Serena Croissant, MD  naproxen sodium (ALEVE) 220 MG tablet Take 220 mg by mouth daily as needed.    [provider]     Critical care time: NA

## 2023-03-21 NOTE — Progress Notes (Signed)
Pt is using flutter for CPT

## 2023-03-21 NOTE — Progress Notes (Addendum)
PROGRESS NOTE    Connie Wells  EXB:[id number] DOB: 01-May-1985 DOA: 03/18/2023 PCP: Default, Provider, MD   Brief Narrative: 38 year old with past medical history significant for breast cancer on Herceptin maintenance therapy, pulmonary nodule, liver lesion, generalized anxiety disorder present complaining of right side rib pain, intermittent associated with shortness of breath.  Evaluation in the ED she was found to be febrile temperature 102, chest x-ray dense dense consolidation within the right lung base.  CT chest new dense consolidation of margin of the right lower lobe with some sparing in the superior segment but multiple segmental involvement.  Appearance favor bacterial pneumonia pattern although pulmonary hemorrhage can have similar appearance.   Assessment & Plan:   Principal Problem:   Sepsis due to pneumonia [facility]) Active Problems:   History of breast cancer   AKI (acute kidney injury) (HCC)   GAD (generalized anxiety disorder)   CAP (community acquired pneumonia)   1-Sepsis secondary to Strep Pneumoniae Pneumonia -Patient presented with fever temperature 102, tachycardiac heart rate 126, tachypnea respiration rate 33, hypotension, source of infection pneumonia.  CT chest with finding consistent with consolidation of the right lower lobe with some spurring in the superior segment. -Strep pneumonia antigen positive -Blood cultures x 2 Strep Pneumoniae.  -Continue with guaifenesin as needed nebulizers -Denies hemoptysis -Monitor WBC, increased today. Afebrile. Continue with current antibiotics.  Repeated Blood culture 1/23. No growth to date.  Check ECHO Ceftriaxone change to Penicillin by ID Pulmonologist consulted.   Strep Pneumoniae bacteremia:  -Continue with IV ceftriaxone.  -ID consulted.   Anemia;  Of malignancy and folic deficiency.  Start folic acid Monitor hb   Right-sided chest wall pain secondary to pneumonia CT chest:New dense consolidation  of much of the right lower lobe with some sparing in the superior segment but multi segmental involvement otherwise. Minimal spillover ground-glass opacity into the right middle lobe. Appearance favors bacterial pneumonia pattern although pulmonary hemorrhage can have a similar appearance.  -IV ceftriaxone  AKI: -Creatinine baseline 0.9 -Creatinine peaked to 1.2.  Continue with IV fluids  Generalized anxiety disorder As needed Ativan  Metastatic breast cancer: Patient has breast cancer with metastasis to multiple side currently on maintenance therapy with Herceptin and Perjeta. -Added Dr. Pamelia Hoit on the treatment team  Hypokalemia: Replete Hypomagnesemia; replete IV    Estimated body mass index is 21.16 kg/m as calculated from the following:   Height as of this encounter: 5\' 6"  (1.676 m).   Weight as of this encounter: 59.5 kg.   DVT prophylaxis: Heparin Code Status: Full code Family Communication: Care discussed with patient Disposition Plan:  Status is: Inpatient Remains inpatient appropriate because: Management of pneumonia bacteremia    Consultants:  None  Procedures:  none  Antimicrobials:    Subjective: Appear aggravated this am, frustrated could sleep.  Report had an episode of cough and worsening chest pain right side last night. Pain is better this am.   Objective: Vitals:   03/21/23 0122 03/21/23 0614 03/21/23 0817 03/21/23 1258  BP:  130/86  (!) 131/97  Pulse:  93  90  Resp: 20 18    Temp:  98.5 F (36.9 C)  97.8 F (36.6 C)  TempSrc:  Oral  Oral  SpO2:  100% 100% 99%  Weight:      Height:        Intake/Output Summary (Last 24 hours) at 03/21/2023 1749 Last data filed at 03/21/2023 1400 Gross per 24 hour  Intake 2665.02 ml  Output --  Net  2665.02 ml   Filed Weights   03/18/23 1757 03/19/23 0228 03/20/23 0407  Weight: 56 kg 56 kg 59.5 kg    Examination:  General exam: NAD Respiratory system:  less Ronchus.  Cardiovascular system: S  1, S 2 RRR Gastrointestinal system: BS present, soft,  nt Central nervous system: alert, follows comand Extremities: no edema   Data Reviewed: I have personally reviewed following labs and imaging studies  CBC: Recent Labs  Lab 03/18/23 1931 03/19/23 0444 03/20/23 0347 03/21/23 0102  WBC 10.7* 8.6 21.5* 17.7*  HGB 11.4* 9.1* 8.2* 7.6*  HCT 33.2* 27.0* 24.4* 22.9*  MCV 94.6 96.1 96.1 98.3  PLT 266 245 216 253   Basic Metabolic Panel: Recent Labs  Lab 03/18/23 1931 03/19/23 0444 03/20/23 0347 03/21/23 0102  NA 134* 135 140 135  K 3.6 3.3* 3.1* 3.4*  CL 103 107 112* 109  CO2 22 21* 21* 19*  GLUCOSE 108* 94 101* 113*  BUN 20 22* 16 13  CREATININE 1.18* 1.23* 1.00 0.79  CALCIUM 8.4* 7.6* 7.4* 7.5*  MG  --   --   --  1.7   GFR: Estimated Creatinine Clearance: 91 mL/min (by C-G formula based on SCr of 0.79 mg/dL). Liver Function Tests: Recent Labs  Lab 03/18/23 1931 03/19/23 0444  AST 20 14*  ALT 15 12  ALKPHOS 62 51  BILITOT 0.7 0.4  PROT 8.1 6.6  ALBUMIN 2.7* 2.1*   No results for input(s): "LIPASE", "AMYLASE" in the last 168 hours. No results for input(s): "AMMONIA" in the last 168 hours. Coagulation Profile: Recent Labs  Lab 03/19/23 0018  INR 1.1   Cardiac Enzymes: No results for input(s): "CKTOTAL", "CKMB", "CKMBINDEX", "TROPONINI" in the last 168 hours. BNP (last 3 results) No results for input(s): "PROBNP" in the last 8760 hours. HbA1C: No results for input(s): "HGBA1C" in the last 72 hours. CBG: No results for input(s): "GLUCAP" in the last 168 hours. Lipid Profile: No results for input(s): "CHOL", "HDL", "LDLCALC", "TRIG", "CHOLHDL", "LDLDIRECT" in the last 72 hours. Thyroid Function Tests: No results for input(s): "TSH", "T4TOTAL", "FREET4", "T3FREE", "THYROIDAB" in the last 72 hours. Anemia Panel: Recent Labs    03/21/23 1020  VITAMINB12 1,029*  FOLATE 5.7*  FERRITIN 118  TIBC 206*  IRON 62  RETICCTPCT 0.5   Sepsis Labs: Recent  Labs  Lab 03/19/23 0016 03/19/23 0556  LATICACIDVEN 0.9 1.6    Recent Results (from the past 240 hours)  Blood Culture (routine x 2)     Status: Abnormal   Collection Time: 03/19/23 12:05 AM   Specimen: BLOOD  Result Value Ref Range Status   Specimen Description   Final    BLOOD SITE NOT SPECIFIED Performed at [facility], [address], [place], [place] [address]    Special Requests   Final    BOTTLES DRAWN AEROBIC AND ANAEROBIC Blood Culture adequate volume Performed at [facility], [address], [place], [place] [address]    Culture  Setup Time   Final    GRAM POSITIVE COCCI IN CHAINS IN BOTH AEROBIC AND ANAEROBIC BOTTLES CRITICAL RESULT CALLED TO, READ BACK BY AND VERIFIED WITH: PHARMD MELISSA JAMES ON 03/19/23 @ 1601 BY DRT Performed at [facility], [address] [place], [place] [address]    Culture STREPTOCOCCUS PNEUMONIAE (A)  Final   Report Status 03/21/2023 FINAL  Final   Organism ID, Bacteria STREPTOCOCCUS PNEUMONIAE  Final      Susceptibility  Streptococcus pneumoniae - MIC*    ERYTHROMYCIN <=0.12 SENSITIVE Sensitive     LEVOFLOXACIN 0.5 SENSITIVE Sensitive     VANCOMYCIN 0.5 SENSITIVE Sensitive     PENICILLIN (meningitis) <=0.06 SENSITIVE Sensitive     PENO - penicillin <=0.06      PENICILLIN (non-meningitis) <=0.06 SENSITIVE Sensitive     PENICILLIN (oral) <=0.06 SENSITIVE Sensitive     CEFTRIAXONE (non-meningitis) <=0.12 SENSITIVE Sensitive     CEFTRIAXONE (meningitis) <=0.12 SENSITIVE Sensitive     * STREPTOCOCCUS PNEUMONIAE  Blood Culture ID Panel (Reflexed)     Status: Abnormal   Collection Time: 03/19/23 12:05 AM  Result Value Ref Range Status   Enterococcus faecalis NOT DETECTED NOT DETECTED Final   Enterococcus Faecium NOT DETECTED NOT DETECTED Final   Listeria monocytogenes NOT DETECTED NOT DETECTED Final   Staphylococcus species NOT DETECTED NOT DETECTED Final   Staphylococcus aureus (BCID) NOT  DETECTED NOT DETECTED Final   Staphylococcus epidermidis NOT DETECTED NOT DETECTED Final   Staphylococcus lugdunensis NOT DETECTED NOT DETECTED Final   Streptococcus species DETECTED (A) NOT DETECTED Final    Comment: CRITICAL RESULT CALLED TO, READ BACK BY AND VERIFIED WITH: PHARMD MELISSA JAMES [id number] AT 1601, DRT    Streptococcus agalactiae NOT DETECTED NOT DETECTED Final   Streptococcus pneumoniae DETECTED (A) NOT DETECTED Final    Comment: CRITICAL RESULT CALLED TO, READ BACK BY AND VERIFIED WITH: PHARMD MELISSA JAMES [id number] AT 1601, DRT    Streptococcus pyogenes NOT DETECTED NOT DETECTED Final   A.calcoaceticus-baumannii NOT DETECTED NOT DETECTED Final   Bacteroides fragilis NOT DETECTED NOT DETECTED Final   Enterobacterales NOT DETECTED NOT DETECTED Final   Enterobacter cloacae complex NOT DETECTED NOT DETECTED Final   Escherichia coli NOT DETECTED NOT DETECTED Final   Klebsiella aerogenes NOT DETECTED NOT DETECTED Final   Klebsiella oxytoca NOT DETECTED NOT DETECTED Final   Klebsiella pneumoniae NOT DETECTED NOT DETECTED Final   Proteus species NOT DETECTED NOT DETECTED Final   Salmonella species NOT DETECTED NOT DETECTED Final   Serratia marcescens NOT DETECTED NOT DETECTED Final   Haemophilus influenzae NOT DETECTED NOT DETECTED Final   Neisseria meningitidis NOT DETECTED NOT DETECTED Final   Pseudomonas aeruginosa NOT DETECTED NOT DETECTED Final   Stenotrophomonas maltophilia NOT DETECTED NOT DETECTED Final   Candida albicans NOT DETECTED NOT DETECTED Final   Candida auris NOT DETECTED NOT DETECTED Final   Candida glabrata NOT DETECTED NOT DETECTED Final   Candida krusei NOT DETECTED NOT DETECTED Final   Candida parapsilosis NOT DETECTED NOT DETECTED Final   Candida tropicalis NOT DETECTED NOT DETECTED Final   Cryptococcus neoformans/gattii NOT DETECTED NOT DETECTED Final    Comment: Performed at [facility], [address] [place], [place] [address]  Blood  Culture (routine x 2)     Status: Abnormal   Collection Time: 03/19/23 12:27 AM   Specimen: BLOOD RIGHT FOREARM  Result Value Ref Range Status   Specimen Description   Final    BLOOD RIGHT FOREARM Performed at [facility], [address], [place], [place] [address]    Special Requests   Final    BOTTLES DRAWN AEROBIC AND ANAEROBIC Blood Culture results may not be optimal due to an inadequate volume of blood received in culture bottles Performed at [facility], [address] [place], [place] [address]    Culture  Setup Time   Final    GRAM POSITIVE COCCI IN CHAINS IN BOTH AEROBIC AND ANAEROBIC  BOTTLES    Culture (A)  Final    STREPTOCOCCUS PNEUMONIAE SUSCEPTIBILITIES PERFORMED ON PREVIOUS CULTURE WITHIN THE LAST 5 DAYS. Performed at [facility], [address], [place], [place] [address]    Report Status 03/21/2023 FINAL  Final  Respiratory (~20 pathogens) panel by PCR     Status: None   Collection Time: 03/19/23  1:05 AM   Specimen: Nasopharyngeal Swab; Respiratory  Result Value Ref Range Status   Adenovirus NOT DETECTED NOT DETECTED Final   Coronavirus 229E NOT DETECTED NOT DETECTED Final    Comment: (NOTE) The Coronavirus on the Respiratory Panel, DOES NOT test for the novel  Coronavirus (2019 nCoV)    Coronavirus HKU1 NOT DETECTED NOT DETECTED Final   Coronavirus NL63 NOT DETECTED NOT DETECTED Final   Coronavirus OC43 NOT DETECTED NOT DETECTED Final   Metapneumovirus NOT DETECTED NOT DETECTED Final   Rhinovirus / Enterovirus NOT DETECTED NOT DETECTED Final   Influenza A NOT DETECTED NOT DETECTED Final   Influenza B NOT DETECTED NOT DETECTED Final   Parainfluenza Virus 1 NOT DETECTED NOT DETECTED Final   Parainfluenza Virus 2 NOT DETECTED NOT DETECTED Final   Parainfluenza Virus 3 NOT DETECTED NOT DETECTED Final   Parainfluenza Virus 4 NOT DETECTED NOT DETECTED Final   Respiratory Syncytial Virus NOT DETECTED NOT DETECTED Final    Bordetella pertussis NOT DETECTED NOT DETECTED Final   Bordetella Parapertussis NOT DETECTED NOT DETECTED Final   Chlamydophila pneumoniae NOT DETECTED NOT DETECTED Final   Mycoplasma pneumoniae NOT DETECTED NOT DETECTED Final    Comment: Performed at [facility], [address], [place], [place] [address]  Culture, blood (Routine X 2) w Reflex to ID Panel     Status: None (Preliminary result)   Collection Time: 03/20/23  4:09 PM   Specimen: BLOOD  Result Value Ref Range Status   Specimen Description   Final    BLOOD SITE NOT SPECIFIED Performed at [facility], [address], [place], [place] [address]    Special Requests   Final    BOTTLES DRAWN AEROBIC ONLY Blood Culture results may not be optimal due to an inadequate volume of blood received in culture bottles Performed at [facility], [address], [place], [place] [address]    Culture   Final    NO GROWTH < 24 HOURS Performed at [facility], [address], [place], [place] [address]    Report Status PENDING  Incomplete  Culture, blood (Routine X 2) w Reflex to ID Panel     Status: None (Preliminary result)   Collection Time: 03/20/23  4:18 PM   Specimen: BLOOD LEFT ARM  Result Value Ref Range Status   Specimen Description   Final    BLOOD LEFT ARM Performed at [facility], [address], [place], [place] [address]    Special Requests   Final    BOTTLES DRAWN AEROBIC AND ANAEROBIC Blood Culture adequate volume Performed at [facility], [address] [place], [place] [address]    Culture   Final    NO GROWTH < 24 HOURS Performed at [facility], [address] [place], [place] [address]    Report Status PENDING  Incomplete         Radiology Studies: ECHOCARDIOGRAM COMPLETE Result Date: 03/21/2023    ECHOCARDIOGRAM REPORT   Patient Name:   DEBBRA DIGIULIO Date of Exam: 03/21/2023 Medical Rec #:  [id number]              Height:       66.0 in Accession #:    [id number]            Weight:       131.1 lb Date of Birth:  05-Sep-1985             BSA:          1.671 m Patient Age:    37 years              BP:           130/86 mmHg Patient Gender: F                     HR:           100 bpm. Exam Location:  Inpatient Procedure: 2D Echo, Cardiac Doppler, Color Doppler and Strain Analysis Indications:    Endocarditis.  History:        Patient has prior history of Echocardiogram examinations, most                 recent 07/03/2022. Signs/Symptoms:Bacteremia and Chest Pain; Risk                 Factors:Current Smoker. Metastatic breast cancer.  Sonographer:    Sheralyn Boatman RDCS Referring Phys: [id number] [facility] IMPRESSIONS  1. Left ventricular ejection fraction, by estimation, is 45 to 50%. The left ventricle has mildly decreased function. The left ventricle has no regional wall motion abnormalities. Left ventricular diastolic parameters were normal.  2. Right ventricular systolic function is normal. The right ventricular size is normal. There is mildly elevated pulmonary artery systolic pressure.  3. The mitral valve is normal in structure. Mild to moderate mitral valve regurgitation. No evidence of mitral stenosis.  4. Tricuspid valve regurgitation is moderate to severe.  5. The aortic valve is tricuspid. Aortic valve regurgitation is not visualized. No aortic stenosis is present.  6. The inferior vena cava is dilated in size with >50% respiratory variability, suggesting right atrial pressure of 8 mmHg. FINDINGS  Left Ventricle: Left ventricular ejection fraction, by estimation, is 45 to 50%. The left ventricle has mildly decreased function. The left ventricle has no regional wall motion abnormalities. The left ventricular internal cavity size was normal in size. There is no left ventricular hypertrophy. Left ventricular diastolic parameters were normal. Normal left ventricular filling pressure. Right Ventricle: The right ventricular size is  normal. No increase in right ventricular wall thickness. Right ventricular systolic function is normal. There is mildly elevated pulmonary artery systolic pressure. The tricuspid regurgitant velocity is 2.65  m/s, and with an assumed right atrial pressure of 8 mmHg, the estimated right ventricular systolic pressure is 36.1 mmHg. Left Atrium: Left atrial size was normal in size. Right Atrium: Right atrial size was normal in size. Pericardium: There is no evidence of pericardial effusion. Mitral Valve: The mitral valve is normal in structure. Mild to moderate mitral valve regurgitation. No evidence of mitral valve stenosis. Tricuspid Valve: The tricuspid valve is normal in structure. Tricuspid valve regurgitation is moderate to severe. No evidence of tricuspid stenosis. Aortic Valve: The aortic valve is tricuspid. Aortic valve regurgitation is not visualized. No aortic stenosis is present. Pulmonic Valve: The pulmonic valve was normal in structure. Pulmonic valve regurgitation is not visualized. No evidence of pulmonic stenosis. Aorta: The aortic root is normal in size and structure. Venous: The inferior vena cava  is dilated in size with greater than 50% respiratory variability, suggesting right atrial pressure of 8 mmHg. IAS/Shunts: No atrial level shunt detected by color flow Doppler.  LEFT VENTRICLE PLAX 2D LVIDd:         4.40 cm     Diastology LVIDs:         3.40 cm     LV e' medial:    12.60 cm/s LV PW:         1.20 cm     LV E/e' medial:  8.4 LV IVS:        1.00 cm     LV e' lateral:   14.80 cm/s LVOT diam:     2.00 cm     LV E/e' lateral: 7.2 LV SV:         62 LV SV Index:   37 LVOT Area:     3.14 cm  LV Volumes (MOD) LV vol d, MOD A2C: 98.9 ml LV vol d, MOD A4C: 90.5 ml LV vol s, MOD A2C: 57.1 ml LV vol s, MOD A4C: 49.7 ml LV SV MOD A2C:     41.8 ml LV SV MOD A4C:     90.5 ml LV SV MOD BP:      42.3 ml RIGHT VENTRICLE             IVC RV S prime:     13.10 cm/s  IVC diam: 2.50 cm TAPSE (M-mode): 1.6 cm LEFT  ATRIUM             Index        RIGHT ATRIUM           Index LA diam:        3.50 cm 2.09 cm/m   RA Area:     11.30 cm LA Vol (A2C):   44.7 ml 26.75 ml/m  RA Volume:   29.40 ml  17.59 ml/m LA Vol (A4C):   35.7 ml 21.36 ml/m LA Biplane Vol: 39.8 ml 23.82 ml/m  AORTIC VALVE LVOT Vmax:   107.00 cm/s LVOT Vmean:  74.100 cm/s LVOT VTI:    0.198 m  AORTA Ao Root diam: 2.90 cm Ao Asc diam:  3.10 cm MITRAL VALVE                TRICUSPID VALVE MV Area (PHT): 5.13 cm     TR Peak grad:   28.1 mmHg MV Decel Time: 148 msec     TR Vmax:        265.00 cm/s MV E velocity: 106.00 cm/s                             SHUNTS                             Systemic VTI:  0.20 m                             Systemic Diam: 2.00 cm Chilton Si MD Electronically signed by Chilton Si MD Signature Date/Time: 03/21/2023/5:15:30 PM    Final    DG CHEST PORT 1 VIEW Result Date: 03/21/2023 CLINICAL DATA:  Cough and shortness of breath. Pneumonia. Breast cancer. EXAM: PORTABLE CHEST 1 VIEW COMPARISON:  03/18/2023 FINDINGS: The heart size and mediastinal contours are within normal limits. Left-sided Port-A-Cath remains in appropriate position. Right lower lobe  airspace disease shows mild improvement since previous study. Small right pleural effusion again seen. IMPRESSION: Mild improvement in right lower lobe airspace disease. Small right pleural effusion. Electronically Signed   By: Danae Orleans M.D.   On: 03/21/2023 12:09        Scheduled Meds:  Chlorhexidine Gluconate Cloth  6 each Topical Daily   guaiFENesin  1,200 mg Oral BID   ipratropium-albuterol  3 mL Nebulization BID   ondansetron (ZOFRAN) IV  4 mg Intravenous Once   polyethylene glycol  17 g Oral Daily   senna-docusate  2 tablet Oral BID   sodium chloride flush  3 mL Intravenous Q12H   Continuous Infusions:  penicillin G potassium 12 Million Units in dextrose 5 % 500 mL CONTINUOUS infusion       LOS: 2 days    Time spent: 35 minutes    Etosha Wetherell A  Jaielle Dlouhy, MD [facility]   If 7PM-7AM, please contact night-coverage [email or web address]  03/21/2023, 5:49 PM

## 2023-03-21 NOTE — Telephone Encounter (Signed)
Needs hospital follow up with chest xray prior to appointment with any MD or APP. (Expected appointment first week of march)

## 2023-03-21 NOTE — Plan of Care (Signed)

## 2023-03-21 NOTE — Progress Notes (Addendum)
[facility]  Date of Admission:  03/18/2023   Total days of inpatient antibiotics 2  Principal Problem:   Sepsis due to pneumonia [facility]) Active Problems:   AKI (acute kidney injury) (HCC)   History of breast cancer   GAD (generalized anxiety disorder)   CAP (community acquired pneumonia)          Assessment: 38 year old female admitted with: #Strep pneumo bacteremia secondary to likely pneumonia #Stage IV breast cancer with mets to lungs and liver on Herceptin maintenance therapy via port every 3 weeks - Patient presented with 4 days of rib pain associate shortness of breath.  She has productive cough as well. CT chest abdomen pelvis showed new dense consolidation right lower lobe appearance favors pneumonia pulmonary hemorrhage can have a similar appearance blood cultures grew strep pneumonia 2/2 sets - Blood cultures grew strep pneumo. - She states that she had her last Herceptin treatment was a week ago.  No concerns at port site. - Oncology following. -Patient continues to have some right-sided pain but remains on room air. Recommendations:  - D/C ctx -Start penicillin - Follow  blood cultures to ensure clearance - TTE - Sputum Cx ordered - Pending TTE results and clearance of bacteremia can possibly forego TEE - Given CT findings for possible underlying pulmonary hemorrhage.  Agree with oncology for to engage pulmonology for input.   Dr. Drue Second is covering this weekend.  New ID team on Monday. Microbiology:   Antibiotics: Ceftriaxone 1/21-present   Cultures: Blood 1/20 to 2/2 strep pneumoniae 1/23    SUBJECTIVE: Resting in bed. On room air States pain is about the same Interval: Afebrile overnight. Wbc 17.7k.   Review of Systems: Review of Systems  All other systems reviewed and are negative.    Scheduled Meds:  Chlorhexidine Gluconate Cloth  6 each Topical Daily   guaiFENesin  1,200 mg Oral BID    ipratropium-albuterol  3 mL Nebulization BID   ondansetron (ZOFRAN) IV  4 mg Intravenous Once   polyethylene glycol  17 g Oral Daily   potassium chloride  40 mEq Oral Q4H   senna-docusate  2 tablet Oral BID   sodium chloride flush  3 mL Intravenous Q12H   Continuous Infusions:  penicillin G potassium 12 Million Units in dextrose 5 % 500 mL CONTINUOUS infusion     PRN Meds:.bisacodyl, HYDROcodone-acetaminophen, levalbuterol, LORazepam, melatonin, morphine injection, ondansetron **OR** ondansetron (ZOFRAN) IV, sodium chloride flush, sodium chloride flush No Known Allergies  OBJECTIVE: Vitals:   03/21/23 0122 03/21/23 0614 03/21/23 0817 03/21/23 1258  BP:  130/86  (!) 131/97  Pulse:  93  90  Resp: 20 18    Temp:  98.5 F (36.9 C)  97.8 F (36.6 C)  TempSrc:  Oral  Oral  SpO2:  100% 100% 99%  Weight:      Height:       Body mass index is 21.16 kg/m.  Physical Exam Constitutional:      Appearance: Normal appearance.  HENT:     Head: Normocephalic and atraumatic.     Right Ear: Tympanic membrane normal.     Left Ear: Tympanic membrane normal.     Nose: Nose normal.     Mouth/Throat:     Mouth: Mucous membranes are moist.  Eyes:     Extraocular Movements: Extraocular movements intact.     Conjunctiva/sclera: Conjunctivae normal.     Pupils: Pupils are equal, round, and reactive to  light.  Cardiovascular:     Rate and Rhythm: Normal rate and regular rhythm.     Heart sounds: No murmur heard.    No friction rub. No gallop.  Pulmonary:     Effort: Pulmonary effort is normal.     Breath sounds: Normal breath sounds.  Abdominal:     General: Abdomen is flat.     Palpations: Abdomen is soft.  Musculoskeletal:        General: Normal range of motion.  Skin:    General: Skin is warm and dry.  Neurological:     General: No focal deficit present.     Mental Status: She is alert and oriented to person, place, and time.  Psychiatric:        Mood and Affect: Mood normal.        Lab Results Lab Results  Component Value Date   WBC 17.7 (H) 03/21/2023   HGB 7.6 (L) 03/21/2023   HCT 22.9 (L) 03/21/2023   MCV 98.3 03/21/2023   PLT 253 03/21/2023    Lab Results  Component Value Date   CREATININE 0.79 03/21/2023   BUN 13 03/21/2023   NA 135 03/21/2023   K 3.4 (L) 03/21/2023   CL 109 03/21/2023   CO2 19 (L) 03/21/2023    Lab Results  Component Value Date   ALT 12 03/19/2023   AST 14 (L) 03/19/2023   ALKPHOS 51 03/19/2023   BILITOT 0.4 03/19/2023        Danelle Earthly, MD [facility] 03/21/2023, 1:00 PM I have personally spent 52 minutes involved in face-to-face and non-face-to-face activities for this patient on the day of the visit. Professional time spent includes the following activities: Preparing to see the patient (review of tests), Obtaining and/or reviewing separately obtained history (admission/discharge record), Performing a medically appropriate examination and/or evaluation , Ordering medications/tests/procedures, referring and communicating with other health care professionals, Documenting clinical information in the EMR, Independently interpreting results (not separately reported), Communicating results to the patient/family/caregiver, Counseling and educating the patient/family/caregiver and Care coordination (not separately reported).

## 2023-03-21 NOTE — Consult Note (Signed)
[facility]    Date of Admission:  03/18/2023   Total days of inpatient antibiotics 2        Reason for Consult: strp bacteremia    Principal Problem:   Sepsis due to pneumonia [facility]) Active Problems:   AKI (acute kidney injury) (HCC)   History of breast cancer   GAD (generalized anxiety disorder)   CAP (community acquired pneumonia)   Assessment: 38 year old female admitted with: #Strep pneumo bacteremia secondary to likely pneumonia #Stage IV breast cancer with mets to lungs and liver on Herceptin maintenance therapy via port every 3 weeks - Patient presented with 4 days of rib pain associate shortness of breath.  She has productive cough as well. - Blood cultures grew strep pneumo. - She states that she had her last Herceptin treatment was a week ago.  No concerns at port site. Recommendations:  -Repeat blood cultures to ensure clearance - TTE - Pending TTE results and clearance of bacteremia can possibly forego TEE - Continue ceftriaxone, follow-up sensitivities Microbiology:   Antibiotics: Ceftriaxone 1/21-present  Cultures: Blood 1/20 to 2/2 strep pneumoniae Urine  Other   HPI: Connie Wells is a 38 y.o. female with past medical history of stage IV breast cancer with mets to liver, pulmonary nodules on Herceptin for cancer treatment, followed by Lillard Anes NP admitted with Right-sided rib pain that started about 4 days ago with associated shortness of breath.  Patient also has associated coughing.  Presented with temp of 102.  Started on ceftriaxone azithromycin.  CT chest abdomen pelvis showed new dense consolidation right lower lobe appearance favors pneumonia pulmonary hemorrhage can have a similar appearance.ID engaged as patient found to have strep pneumo bacteremia.  Review of Systems: Review of Systems  All other systems reviewed and are negative.   Past Medical History:  Diagnosis Date   Herpes genitalia     Solitary kidney     Social History   Tobacco Use   Smoking status: Every Day    Current packs/day: 0.50    Average packs/day: 0.5 packs/day for 5.0 years (2.5 ttl pk-yrs)    Types: Cigarettes   Smokeless tobacco: Never  Substance Use Topics   Alcohol use: No    Comment: Occassional   Drug use: Yes    Types: Marijuana    Family History  Problem Relation Age of Onset   Diabetes Mother    Hypertension Mother    Diabetes Maternal Aunt    Asthma Brother    Scheduled Meds:  Chlorhexidine Gluconate Cloth  6 each Topical Daily   guaiFENesin  1,200 mg Oral BID   ipratropium-albuterol  3 mL Nebulization TID   ondansetron (ZOFRAN) IV  4 mg Intravenous Once   polyethylene glycol  17 g Oral Daily   potassium chloride  40 mEq Oral Q4H   senna-docusate  2 tablet Oral BID   sodium chloride flush  3 mL Intravenous Q12H   Continuous Infusions:  cefTRIAXone (ROCEPHIN)  IV 2 g (03/21/23 0019)   magnesium sulfate bolus IVPB     PRN Meds:.bisacodyl, HYDROcodone-acetaminophen, levalbuterol, LORazepam, morphine injection, ondansetron **OR** ondansetron (ZOFRAN) IV, sodium chloride flush, sodium chloride flush No Known Allergies  OBJECTIVE: Blood pressure 130/86, pulse 93, temperature 98.5 F (36.9 C), temperature source Oral, resp. rate 18, height 5\' 6"  (1.676 m), weight 59.5 kg, last menstrual period 03/17/2023, SpO2 100%.  Physical Exam Constitutional:      Appearance: Normal appearance.  HENT:     Head: Normocephalic and atraumatic.     Right Ear: Tympanic membrane normal.     Left Ear: Tympanic membrane normal.     Nose: Nose normal.     Mouth/Throat:     Mouth: Mucous membranes are moist.  Eyes:     Extraocular Movements: Extraocular movements intact.     Conjunctiva/sclera: Conjunctivae normal.     Pupils: Pupils are equal, round, and reactive to light.  Cardiovascular:     Rate and Rhythm: Normal rate and regular rhythm.     Heart sounds: No murmur heard.    No  friction rub. No gallop.  Pulmonary:     Effort: Pulmonary effort is normal.     Breath sounds: Normal breath sounds.  Abdominal:     General: Abdomen is flat.     Palpations: Abdomen is soft.  Musculoskeletal:        General: Normal range of motion.  Skin:    General: Skin is warm and dry.  Neurological:     General: No focal deficit present.     Mental Status: She is alert and oriented to person, place, and time.  Psychiatric:        Mood and Affect: Mood normal.     Lab Results Lab Results  Component Value Date   WBC 17.7 (H) 03/21/2023   HGB 7.6 (L) 03/21/2023   HCT 22.9 (L) 03/21/2023   MCV 98.3 03/21/2023   PLT 253 03/21/2023    Lab Results  Component Value Date   CREATININE 0.79 03/21/2023   BUN 13 03/21/2023   NA 135 03/21/2023   K 3.4 (L) 03/21/2023   CL 109 03/21/2023   CO2 19 (L) 03/21/2023    Lab Results  Component Value Date   ALT 12 03/19/2023   AST 14 (L) 03/19/2023   ALKPHOS 51 03/19/2023   BILITOT 0.4 03/19/2023       Danelle Earthly, MD [facility] 03/21/2023, 9:04 AM   I have personally spent 82 minutes involved in face-to-face and non-face-to-face activities for this patient on the day of the visit. Professional time spent includes the following activities: Preparing to see the patient (review of tests), Obtaining and/or reviewing separately obtained history (admission/discharge record), Performing a medically appropriate examination and/or evaluation , Ordering medications/tests/procedures, referring and communicating with other health care professionals, Documenting clinical information in the EMR, Independently interpreting results (not separately reported), Communicating results to the patient/family/caregiver, Counseling and educating the patient/family/caregiver and Care coordination (not separately reported).

## 2023-03-22 DIAGNOSIS — J189 Pneumonia, unspecified organism: Secondary | ICD-10-CM | POA: Diagnosis not present

## 2023-03-22 DIAGNOSIS — A419 Sepsis, unspecified organism: Secondary | ICD-10-CM | POA: Diagnosis not present

## 2023-03-22 LAB — CBC
HCT: 24.3 % — ABNORMAL LOW (ref 36.0–46.0)
Hemoglobin: 7.8 g/dL — ABNORMAL LOW (ref 12.0–15.0)
MCH: 31.7 pg (ref 26.0–34.0)
MCHC: 32.1 g/dL (ref 30.0–36.0)
MCV: 98.8 fL (ref 80.0–100.0)
Platelets: 295 10*3/uL (ref 150–400)
RBC: 2.46 MIL/uL — ABNORMAL LOW (ref 3.87–5.11)
RDW: 15.6 % — ABNORMAL HIGH (ref 11.5–15.5)
WBC: 9.4 10*3/uL (ref 4.0–10.5)
nRBC: 0.2 % (ref 0.0–0.2)

## 2023-03-22 LAB — LIPID PANEL
Cholesterol: 87 mg/dL (ref 0–200)
HDL: <10 mg/dL — ABNORMAL LOW (ref >40)
Triglycerides: 174 mg/dL — ABNORMAL HIGH (ref <150)
VLDL: 35 mg/dL (ref 0–40)

## 2023-03-22 LAB — TROPONIN I (HIGH SENSITIVITY)
Troponin I (High Sensitivity): 3 ng/L (ref <18)
Troponin I (High Sensitivity): 3 ng/L (ref <18)

## 2023-03-22 LAB — BASIC METABOLIC PANEL
Anion gap: 8 (ref 5–15)
BUN: 9 mg/dL (ref 6–20)
CO2: 21 mmol/L — ABNORMAL LOW (ref 22–32)
Calcium: 8 mg/dL — ABNORMAL LOW (ref 8.9–10.3)
Chloride: 109 mmol/L (ref 98–111)
Creatinine, Ser: 0.89 mg/dL (ref 0.44–1.00)
GFR, Estimated: >60 mL/min (ref >60)
Glucose, Bld: 102 mg/dL — ABNORMAL HIGH (ref 70–99)
Potassium: 4.6 mmol/L (ref 3.5–5.1)
Sodium: 138 mmol/L (ref 135–145)

## 2023-03-22 MED ORDER — LOSARTAN POTASSIUM 25 MG PO TABS
25.0000 mg | ORAL_TABLET | Freq: Every day | ORAL | Status: DC
  Administered 2023-03-23 – 2023-03-25 (×3): 25 mg via ORAL
  Filled 2023-03-22 (×3): qty 1

## 2023-03-22 MED ORDER — CARVEDILOL 3.125 MG PO TABS
3.1250 mg | ORAL_TABLET | Freq: Two times a day (BID) | ORAL | Status: DC
  Administered 2023-03-22 – 2023-03-25 (×5): 3.125 mg via ORAL
  Filled 2023-03-22 (×5): qty 1

## 2023-03-22 MED ORDER — ORAL CARE MOUTH RINSE: 15.0000 mL | OROMUCOSAL | Status: DC | PRN

## 2023-03-22 MED ORDER — MORPHINE SULFATE (PF) 2 MG/ML IV SOLN
2.0000 mg | Freq: Once | INTRAVENOUS | Status: AC
  Administered 2023-03-22: 2 mg via INTRAVENOUS
  Filled 2023-03-22: qty 1

## 2023-03-22 MED ORDER — ENSURE ENLIVE PO LIQD
237.0000 mL | Freq: Two times a day (BID) | ORAL | Status: DC
  Administered 2023-03-22 – 2023-03-25 (×5): 237 mL via ORAL

## 2023-03-22 MED ORDER — LOSARTAN POTASSIUM 25 MG PO TABS
12.5000 mg | ORAL_TABLET | Freq: Every day | ORAL | Status: DC
Start: 2023-03-22 — End: 2023-03-22
  Administered 2023-03-22: 12.5 mg via ORAL
  Filled 2023-03-22: qty 1

## 2023-03-22 NOTE — Plan of Care (Signed)

## 2023-03-22 NOTE — Progress Notes (Signed)
HEMATOLOGY-ONCOLOGY PROGRESS NOTE  SUBJECTIVE: Patient reports that the breathing is improving.  She is able to take a deep breath.  She continues to have some discomfort in the right lower chest wall area.  She complained of difficulty with sleeping and after morphine she was able to rest.  Denies any further fevers.  Oncology History  Breast cancer metastasized to multiple sites [facility])  06/16/2020 Initial Diagnosis   [place]:  stage IV: T2 N1 M1 ER/PR negative HER2 positive metastatic breast cancer (diagnosed and treated by Dr. Welton Flakes) 08/09/2020 -11/02/2020 6 cycles of THP, on Herceptin and Perjeta maintenance Right breast DCIS   01/21/2023 -  Chemotherapy   Patient is on Treatment Plan : BREAST Trastuzumab + Pertuzumab q21d       OBJECTIVE: REVIEW OF SYSTEMS:   Constitutional: Denies fevers, chills or abnormal weight loss Breathing much improved, Less cough All other systems were reviewed with the patient and are negative.  I have reviewed the past medical history, past surgical history, social history and family history with the patient and they are unchanged from previous note.   PHYSICAL EXAMINATION: ECOG PERFORMANCE STATUS: 2 - Symptomatic, <50% confined to bed  Vitals:   03/22/23 0506 03/22/23 0758  BP: (!) 141/108   Pulse: 91   Resp: 16   Temp: 98.1 F (36.7 C)   SpO2: 100% 95%   Filed Weights   03/18/23 1757 03/19/23 0228 03/20/23 0407  Weight: 123 lb 7.3 oz (56 kg) 123 lb 7.3 oz (56 kg) 131 lb 1.6 oz (59.5 kg)    GENERAL:alert, no distress and comfortable Lungs: Dim BS   LABORATORY DATA:  I have reviewed the data as listed    Latest Ref Rng & Units 03/22/2023    5:00 AM 03/21/2023    1:02 AM 03/20/2023    3:47 AM  CMP  Glucose 70 - 99 mg/dL 782  956  213   BUN 6 - 20 mg/dL 9  13  16    Creatinine 0.44 - 1.00 mg/dL 0.86  5.78  4.69   Sodium 135 - 145 mmol/L 138  135  140   Potassium 3.5 - 5.1 mmol/L 4.6  3.4  3.1   Chloride 98 - 111 mmol/L 109  109  112    CO2 22 - 32 mmol/L 21  19  21    Calcium 8.9 - 10.3 mg/dL 8.0  7.5  7.4     Lab Results  Component Value Date   WBC 9.4 03/22/2023   HGB 7.8 (L) 03/22/2023   HCT 24.3 (L) 03/22/2023   MCV 98.8 03/22/2023   PLT 295 03/22/2023   NEUTROABS 3.3 01/21/2023    ASSESSMENT AND PLAN: 1.  Right lower lobe pneumonia: Currently on antibiotics.  Appreciate hospitalist and infectious disease help.  Pulmonary has been consulted.  They recommended a CT in 4 to 6 weeks. 2. metastatic breast cancer: She is on Herceptin and Perjeta maintenance and from the cancer standpoint she appears to be stable/improving. 3.  Anemia: Normocytic secondary to infection.  Transfuse for hemoglobin less than 7.5.  Please do not hesitate to contact me for any questions or concerns.

## 2023-03-22 NOTE — Progress Notes (Signed)
PROGRESS NOTE    Connie Wells  ZOX:[id number] DOB: 29-Apr-1985 DOA: 03/18/2023 PCP: Default, Provider, MD   Brief Narrative: 38 year old with past medical history significant for breast cancer on Herceptin maintenance therapy, pulmonary nodule, liver lesion, generalized anxiety disorder present complaining of right side rib pain, intermittent associated with shortness of breath.  Evaluation in the ED she was found to be febrile temperature 102, chest x-ray dense dense consolidation within the right lung base.  CT chest new dense consolidation of margin of the right lower lobe with some sparing in the superior segment but multiple segmental involvement.  Appearance favor bacterial pneumonia pattern although pulmonary hemorrhage can have similar appearance.   Assessment & Plan:   Principal Problem:   Sepsis due to pneumonia [facility]) Active Problems:   History of breast cancer   AKI (acute kidney injury) (HCC)   GAD (generalized anxiety disorder)   CAP (community acquired pneumonia)   1-Sepsis secondary to Strep Pneumoniae Pneumonia -Patient presented with fever temperature 102, tachycardiac heart rate 126, tachypnea respiration rate 33, hypotension, source of infection pneumonia.  CT chest with finding consistent with consolidation of the right lower lobe with some spurring in the superior segment. -Strep pneumonia antigen positive -Blood cultures x 2 Strep Pneumoniae.  -Continue with guaifenesin as needed nebulizers -Denies hemoptysis -Monitor WBC, increased today. Afebrile. Continue with current antibiotics.  Repeated Blood culture 1/23. No growth to date.   ECHO, no vegetation, Ef 45--50 % Ceftriaxone change to Penicillin by ID Pulmonologist consulted. Recommend repeat ct chest in 4 weeks.    Strep Pneumoniae bacteremia:  -Continue with IV ceftriaxone.  -ID consulted.   New Cardiomyopathy EF 45--50% HTN Suspect related to acute illness. Also needs close follow up with  her cardiologist. Melina Fiddler on chemo Started on Cozaar. Will also start carvedilol.   Anemia;  Of malignancy and folic deficiency.  Started folic acid Monitor hb   Right-sided chest wall pain secondary to pneumonia CT chest:New dense consolidation of much of the right lower lobe with some sparing in the superior segment but multi segmental involvement otherwise. Minimal spillover ground-glass opacity into the right middle lobe. Appearance favors bacterial pneumonia pattern although pulmonary hemorrhage can have a similar appearance.  -IV penicillin.   AKI: -Creatinine baseline 0.9 -Creatinine peaked to 1.2.  Continue with IV fluids  Generalized anxiety disorder As needed Ativan  Metastatic breast cancer: Patient has breast cancer with metastasis to multiple side currently on maintenance therapy with Herceptin and Perjeta. -Added Dr. Pamelia Hoit on the treatment team  Hypokalemia: Replaced.  Hypomagnesemia; Replaced.     Estimated body mass index is 21.16 kg/m as calculated from the following:   Height as of this encounter: 5\' 6"  (1.676 m).   Weight as of this encounter: 59.5 kg.   DVT prophylaxis: Heparin Code Status: Full code Family Communication: Care discussed with patient Disposition Plan:  Status is: Inpatient Remains inpatient appropriate because: Management of pneumonia bacteremia    Consultants:  None  Procedures:  none  Antimicrobials:    Subjective: She report she is breathing better. Able to take deep breath better. Report coughing up phlegm.   Objective: Vitals:   03/22/23 1224 03/22/23 1239 03/22/23 1605 03/22/23 1607  BP:  (!) 136/111 (!) 174/122 (!) 154/117  Pulse: (!) 102 97 92 (!) 109  Resp:  20    Temp:  98.2 F (36.8 C) 98.1 F (36.7 C)   TempSrc:  Oral Oral   SpO2: 100% 99% (!) 88%   Weight:  Height:        Intake/Output Summary (Last 24 hours) at 03/22/2023 1615 Last data filed at 03/22/2023 0344 Gross per 24 hour  Intake 268.97  ml  Output --  Net 268.97 ml   Filed Weights   03/18/23 1757 03/19/23 0228 03/20/23 0407  Weight: 56 kg 56 kg 59.5 kg    Examination:  General exam: NAD Respiratory system:  BL ronchus.  Cardiovascular system: S 1, S 2 RRR Gastrointestinal system: BS present, soft nt Central nervous system: non focal.  Extremities: no edema   Data Reviewed: I have personally reviewed following labs and imaging studies  CBC: Recent Labs  Lab 03/18/23 1931 03/19/23 0444 03/20/23 0347 03/21/23 0102 03/22/23 0500  WBC 10.7* 8.6 21.5* 17.7* 9.4  HGB 11.4* 9.1* 8.2* 7.6* 7.8*  HCT 33.2* 27.0* 24.4* 22.9* 24.3*  MCV 94.6 96.1 96.1 98.3 98.8  PLT 266 245 216 253 295   Basic Metabolic Panel: Recent Labs  Lab 03/18/23 1931 03/19/23 0444 03/20/23 0347 03/21/23 0102 03/22/23 0500  NA 134* 135 140 135 138  K 3.6 3.3* 3.1* 3.4* 4.6  CL 103 107 112* 109 109  CO2 22 21* 21* 19* 21*  GLUCOSE 108* 94 101* 113* 102*  BUN 20 22* 16 13 9   CREATININE 1.18* 1.23* 1.00 0.79 0.89  CALCIUM 8.4* 7.6* 7.4* 7.5* 8.0*  MG  --   --   --  1.7  --    GFR: Estimated Creatinine Clearance: 81.8 mL/min (by C-G formula based on SCr of 0.89 mg/dL). Liver Function Tests: Recent Labs  Lab 03/18/23 1931 03/19/23 0444  AST 20 14*  ALT 15 12  ALKPHOS 62 51  BILITOT 0.7 0.4  PROT 8.1 6.6  ALBUMIN 2.7* 2.1*   No results for input(s): "LIPASE", "AMYLASE" in the last 168 hours. No results for input(s): "AMMONIA" in the last 168 hours. Coagulation Profile: Recent Labs  Lab 03/19/23 0018  INR 1.1   Cardiac Enzymes: No results for input(s): "CKTOTAL", "CKMB", "CKMBINDEX", "TROPONINI" in the last 168 hours. BNP (last 3 results) No results for input(s): "PROBNP" in the last 8760 hours. HbA1C: No results for input(s): "HGBA1C" in the last 72 hours. CBG: No results for input(s): "GLUCAP" in the last 168 hours. Lipid Profile: No results for input(s): "CHOL", "HDL", "LDLCALC", "TRIG", "CHOLHDL",  "LDLDIRECT" in the last 72 hours. Thyroid Function Tests: No results for input(s): "TSH", "T4TOTAL", "FREET4", "T3FREE", "THYROIDAB" in the last 72 hours. Anemia Panel: Recent Labs    03/21/23 1020  VITAMINB12 1,029*  FOLATE 5.7*  FERRITIN 118  TIBC 206*  IRON 62  RETICCTPCT 0.5   Sepsis Labs: Recent Labs  Lab 03/19/23 0016 03/19/23 0556  LATICACIDVEN 0.9 1.6    Recent Results (from the past 240 hours)  Blood Culture (routine x 2)     Status: Abnormal   Collection Time: 03/19/23 12:05 AM   Specimen: BLOOD  Result Value Ref Range Status   Specimen Description   Final    BLOOD SITE NOT SPECIFIED Performed at [facility], [address] [place], [place] [address]    Special Requests   Final    BOTTLES DRAWN AEROBIC AND ANAEROBIC Blood Culture adequate volume Performed at [facility], [address], [place], [place] [address]    Culture  Setup Time   Final    GRAM POSITIVE COCCI IN CHAINS IN BOTH AEROBIC AND ANAEROBIC BOTTLES CRITICAL RESULT CALLED TO, READ BACK BY AND VERIFIED WITH: PHARMD MELISSA  JAMES ON 03/19/23 @ 1601 BY DRT Performed at [facility], [address] [place], [place] [address]    Culture STREPTOCOCCUS PNEUMONIAE (A)  Final   Report Status 03/21/2023 FINAL  Final   Organism ID, Bacteria STREPTOCOCCUS PNEUMONIAE  Final      Susceptibility   Streptococcus pneumoniae - MIC*    ERYTHROMYCIN <=0.12 SENSITIVE Sensitive     LEVOFLOXACIN 0.5 SENSITIVE Sensitive     VANCOMYCIN 0.5 SENSITIVE Sensitive     PENICILLIN (meningitis) <=0.06 SENSITIVE Sensitive     PENO - penicillin <=0.06      PENICILLIN (non-meningitis) <=0.06 SENSITIVE Sensitive     PENICILLIN (oral) <=0.06 SENSITIVE Sensitive     CEFTRIAXONE (non-meningitis) <=0.12 SENSITIVE Sensitive     CEFTRIAXONE (meningitis) <=0.12 SENSITIVE Sensitive     * STREPTOCOCCUS PNEUMONIAE  Blood Culture ID Panel (Reflexed)     Status: Abnormal   Collection  Time: 03/19/23 12:05 AM  Result Value Ref Range Status   Enterococcus faecalis NOT DETECTED NOT DETECTED Final   Enterococcus Faecium NOT DETECTED NOT DETECTED Final   Listeria monocytogenes NOT DETECTED NOT DETECTED Final   Staphylococcus species NOT DETECTED NOT DETECTED Final   Staphylococcus aureus (BCID) NOT DETECTED NOT DETECTED Final   Staphylococcus epidermidis NOT DETECTED NOT DETECTED Final   Staphylococcus lugdunensis NOT DETECTED NOT DETECTED Final   Streptococcus species DETECTED (A) NOT DETECTED Final    Comment: CRITICAL RESULT CALLED TO, READ BACK BY AND VERIFIED WITH: PHARMD MELISSA JAMES [id number] AT 1601, DRT    Streptococcus agalactiae NOT DETECTED NOT DETECTED Final   Streptococcus pneumoniae DETECTED (A) NOT DETECTED Final    Comment: CRITICAL RESULT CALLED TO, READ BACK BY AND VERIFIED WITH: PHARMD MELISSA JAMES [id number] AT 1601, DRT    Streptococcus pyogenes NOT DETECTED NOT DETECTED Final   A.calcoaceticus-baumannii NOT DETECTED NOT DETECTED Final   Bacteroides fragilis NOT DETECTED NOT DETECTED Final   Enterobacterales NOT DETECTED NOT DETECTED Final   Enterobacter cloacae complex NOT DETECTED NOT DETECTED Final   Escherichia coli NOT DETECTED NOT DETECTED Final   Klebsiella aerogenes NOT DETECTED NOT DETECTED Final   Klebsiella oxytoca NOT DETECTED NOT DETECTED Final   Klebsiella pneumoniae NOT DETECTED NOT DETECTED Final   Proteus species NOT DETECTED NOT DETECTED Final   Salmonella species NOT DETECTED NOT DETECTED Final   Serratia marcescens NOT DETECTED NOT DETECTED Final   Haemophilus influenzae NOT DETECTED NOT DETECTED Final   Neisseria meningitidis NOT DETECTED NOT DETECTED Final   Pseudomonas aeruginosa NOT DETECTED NOT DETECTED Final   Stenotrophomonas maltophilia NOT DETECTED NOT DETECTED Final   Candida albicans NOT DETECTED NOT DETECTED Final   Candida auris NOT DETECTED NOT DETECTED Final   Candida glabrata NOT DETECTED NOT DETECTED Final    Candida krusei NOT DETECTED NOT DETECTED Final   Candida parapsilosis NOT DETECTED NOT DETECTED Final   Candida tropicalis NOT DETECTED NOT DETECTED Final   Cryptococcus neoformans/gattii NOT DETECTED NOT DETECTED Final    Comment: Performed at [facility], [address], [place], [place] [address]  Blood Culture (routine x 2)     Status: Abnormal   Collection Time: 03/19/23 12:27 AM   Specimen: BLOOD RIGHT FOREARM  Result Value Ref Range Status   Specimen Description   Final    BLOOD RIGHT FOREARM Performed at [facility], [address], [place], [place] [address]    Special Requests   Final    BOTTLES DRAWN AEROBIC AND ANAEROBIC  Blood Culture results may not be optimal due to an inadequate volume of blood received in culture bottles Performed at [facility], [address] [place], [place] [address]    Culture  Setup Time   Final    GRAM POSITIVE COCCI IN CHAINS IN BOTH AEROBIC AND ANAEROBIC BOTTLES    Culture (A)  Final    STREPTOCOCCUS PNEUMONIAE SUSCEPTIBILITIES PERFORMED ON PREVIOUS CULTURE WITHIN THE LAST 5 DAYS. Performed at [facility], [address] [place], [place] [address]    Report Status 03/21/2023 FINAL  Final  Respiratory (~20 pathogens) panel by PCR     Status: None   Collection Time: 03/19/23  1:05 AM   Specimen: Nasopharyngeal Swab; Respiratory  Result Value Ref Range Status   Adenovirus NOT DETECTED NOT DETECTED Final   Coronavirus 229E NOT DETECTED NOT DETECTED Final    Comment: (NOTE) The Coronavirus on the Respiratory Panel, DOES NOT test for the novel  Coronavirus (2019 nCoV)    Coronavirus HKU1 NOT DETECTED NOT DETECTED Final   Coronavirus NL63 NOT DETECTED NOT DETECTED Final   Coronavirus OC43 NOT DETECTED NOT DETECTED Final   Metapneumovirus NOT DETECTED NOT DETECTED Final   Rhinovirus / Enterovirus NOT DETECTED NOT DETECTED Final   Influenza A NOT DETECTED NOT DETECTED Final   Influenza B NOT  DETECTED NOT DETECTED Final   Parainfluenza Virus 1 NOT DETECTED NOT DETECTED Final   Parainfluenza Virus 2 NOT DETECTED NOT DETECTED Final   Parainfluenza Virus 3 NOT DETECTED NOT DETECTED Final   Parainfluenza Virus 4 NOT DETECTED NOT DETECTED Final   Respiratory Syncytial Virus NOT DETECTED NOT DETECTED Final   Bordetella pertussis NOT DETECTED NOT DETECTED Final   Bordetella Parapertussis NOT DETECTED NOT DETECTED Final   Chlamydophila pneumoniae NOT DETECTED NOT DETECTED Final   Mycoplasma pneumoniae NOT DETECTED NOT DETECTED Final    Comment: Performed at [facility], [address], [place], [place] [address]  Culture, blood (Routine X 2) w Reflex to ID Panel     Status: None (Preliminary result)   Collection Time: 03/20/23  4:09 PM   Specimen: BLOOD  Result Value Ref Range Status   Specimen Description   Final    BLOOD SITE NOT SPECIFIED Performed at [facility], [address], [place], [place] [address]    Special Requests   Final    BOTTLES DRAWN AEROBIC ONLY Blood Culture results may not be optimal due to an inadequate volume of blood received in culture bottles Performed at [facility], [address] [place], [place] [address]    Culture   Final    NO GROWTH 2 DAYS Performed at [facility], [address], [place], [place] [address]    Report Status PENDING  Incomplete  Culture, blood (Routine X 2) w Reflex to ID Panel     Status: None (Preliminary result)   Collection Time: 03/20/23  4:18 PM   Specimen: BLOOD LEFT ARM  Result Value Ref Range Status   Specimen Description   Final    BLOOD LEFT ARM Performed at [facility], [address] [place], [place] [address]    Special Requests   Final    BOTTLES DRAWN AEROBIC AND ANAEROBIC Blood Culture adequate volume Performed at [facility], [address], [place], [place] [address]    Culture   Final    NO GROWTH 2 DAYS Performed at  [facility], [address] [place], [place] [address]    Report Status PENDING  Incomplete         Radiology Studies: ECHOCARDIOGRAM COMPLETE Result Date: 03/21/2023    ECHOCARDIOGRAM REPORT   Patient Name:   ALYCEN MACK Date of Exam: 03/21/2023 Medical Rec #:  [id number]             Height:       66.0 in Accession #:    [id number]            Weight:       131.1 lb Date of Birth:  01/31/1986             BSA:          1.671 m Patient Age:    37 years              BP:           130/86 mmHg Patient Gender: F                     HR:           100 bpm. Exam Location:  Inpatient Procedure: 2D Echo, Cardiac Doppler, Color Doppler and Strain Analysis Indications:    Endocarditis.  History:        Patient has prior history of Echocardiogram examinations, most                 recent 07/03/2022. Signs/Symptoms:Bacteremia and Chest Pain; Risk                 Factors:Current Smoker. Metastatic breast cancer.  Sonographer:    Sheralyn Boatman RDCS Referring Phys: [id number] [facility] IMPRESSIONS  1. Left ventricular ejection fraction, by estimation, is 45 to 50%. The left ventricle has mildly decreased function. The left ventricle has no regional wall motion abnormalities. Left ventricular diastolic parameters were normal.  2. Right ventricular systolic function is normal. The right ventricular size is normal. There is mildly elevated pulmonary artery systolic pressure.  3. The mitral valve is normal in structure. Mild to moderate mitral valve regurgitation. No evidence of mitral stenosis.  4. Tricuspid valve regurgitation is moderate to severe.  5. The aortic valve is tricuspid. Aortic valve regurgitation is not visualized. No aortic stenosis is present.  6. The inferior vena cava is dilated in size with >50% respiratory variability, suggesting right atrial pressure of 8 mmHg. FINDINGS  Left Ventricle: Left ventricular ejection fraction, by estimation, is 45 to 50%. The left ventricle has mildly decreased  function. The left ventricle has no regional wall motion abnormalities. The left ventricular internal cavity size was normal in size. There is no left ventricular hypertrophy. Left ventricular diastolic parameters were normal. Normal left ventricular filling pressure. Right Ventricle: The right ventricular size is normal. No increase in right ventricular wall thickness. Right ventricular systolic function is normal. There is mildly elevated pulmonary artery systolic pressure. The tricuspid regurgitant velocity is 2.65  m/s, and with an assumed right atrial pressure of 8 mmHg, the estimated right ventricular systolic pressure is 36.1 mmHg. Left Atrium: Left atrial size was normal in size. Right Atrium: Right atrial size was normal in size. Pericardium: There is no evidence of pericardial effusion. Mitral Valve: The mitral valve is normal in structure. Mild to moderate mitral valve regurgitation. No evidence of mitral valve stenosis. Tricuspid Valve: The tricuspid valve is normal in structure. Tricuspid valve regurgitation is moderate to severe. No evidence of tricuspid stenosis.  Aortic Valve: The aortic valve is tricuspid. Aortic valve regurgitation is not visualized. No aortic stenosis is present. Pulmonic Valve: The pulmonic valve was normal in structure. Pulmonic valve regurgitation is not visualized. No evidence of pulmonic stenosis. Aorta: The aortic root is normal in size and structure. Venous: The inferior vena cava is dilated in size with greater than 50% respiratory variability, suggesting right atrial pressure of 8 mmHg. IAS/Shunts: No atrial level shunt detected by color flow Doppler.  LEFT VENTRICLE PLAX 2D LVIDd:         4.40 cm     Diastology LVIDs:         3.40 cm     LV e' medial:    12.60 cm/s LV PW:         1.20 cm     LV E/e' medial:  8.4 LV IVS:        1.00 cm     LV e' lateral:   14.80 cm/s LVOT diam:     2.00 cm     LV E/e' lateral: 7.2 LV SV:         62 LV SV Index:   37 LVOT Area:     3.14 cm   LV Volumes (MOD) LV vol d, MOD A2C: 98.9 ml LV vol d, MOD A4C: 90.5 ml LV vol s, MOD A2C: 57.1 ml LV vol s, MOD A4C: 49.7 ml LV SV MOD A2C:     41.8 ml LV SV MOD A4C:     90.5 ml LV SV MOD BP:      42.3 ml RIGHT VENTRICLE             IVC RV S prime:     13.10 cm/s  IVC diam: 2.50 cm TAPSE (M-mode): 1.6 cm LEFT ATRIUM             Index        RIGHT ATRIUM           Index LA diam:        3.50 cm 2.09 cm/m   RA Area:     11.30 cm LA Vol (A2C):   44.7 ml 26.75 ml/m  RA Volume:   29.40 ml  17.59 ml/m LA Vol (A4C):   35.7 ml 21.36 ml/m LA Biplane Vol: 39.8 ml 23.82 ml/m  AORTIC VALVE LVOT Vmax:   107.00 cm/s LVOT Vmean:  74.100 cm/s LVOT VTI:    0.198 m  AORTA Ao Root diam: 2.90 cm Ao Asc diam:  3.10 cm MITRAL VALVE                TRICUSPID VALVE MV Area (PHT): 5.13 cm     TR Peak grad:   28.1 mmHg MV Decel Time: 148 msec     TR Vmax:        265.00 cm/s MV E velocity: 106.00 cm/s                             SHUNTS                             Systemic VTI:  0.20 m                             Systemic Diam: 2.00 cm Chilton Si MD Electronically signed by Chilton Si MD Signature Date/Time: 03/21/2023/5:15:30 PM  Final    DG CHEST PORT 1 VIEW Result Date: 03/21/2023 CLINICAL DATA:  Cough and shortness of breath. Pneumonia. Breast cancer. EXAM: PORTABLE CHEST 1 VIEW COMPARISON:  03/18/2023 FINDINGS: The heart size and mediastinal contours are within normal limits. Left-sided Port-A-Cath remains in appropriate position. Right lower lobe airspace disease shows mild improvement since previous study. Small right pleural effusion again seen. IMPRESSION: Mild improvement in right lower lobe airspace disease. Small right pleural effusion. Electronically Signed   By: Danae Orleans M.D.   On: 03/21/2023 12:09        Scheduled Meds:  Chlorhexidine Gluconate Cloth  6 each Topical Daily   feeding supplement  237 mL Oral BID BM   folic acid  1 mg Oral Daily   guaiFENesin  1,200 mg Oral BID    ipratropium-albuterol  3 mL Nebulization BID   losartan  12.5 mg Oral Daily   ondansetron (ZOFRAN) IV  4 mg Intravenous Once   polyethylene glycol  17 g Oral Daily   senna-docusate  2 tablet Oral BID   sodium chloride flush  3 mL Intravenous Q12H   Continuous Infusions:  penicillin G potassium 12 Million Units in dextrose 5 % 500 mL CONTINUOUS infusion 12 Million Units (03/22/23 0918)     LOS: 3 days    Time spent: 35 minutes    Ly Wass A Lue Sykora, MD [facility]   If 7PM-7AM, please contact night-coverage [email or web address]  03/22/2023, 4:15 PM

## 2023-03-23 DIAGNOSIS — A419 Sepsis, unspecified organism: Secondary | ICD-10-CM | POA: Diagnosis not present

## 2023-03-23 DIAGNOSIS — J189 Pneumonia, unspecified organism: Secondary | ICD-10-CM | POA: Diagnosis not present

## 2023-03-23 LAB — BASIC METABOLIC PANEL
Anion gap: 5 (ref 5–15)
BUN: 9 mg/dL (ref 6–20)
CO2: 27 mmol/L (ref 22–32)
Calcium: 8.4 mg/dL — ABNORMAL LOW (ref 8.9–10.3)
Chloride: 104 mmol/L (ref 98–111)
Creatinine, Ser: 0.6 mg/dL (ref 0.44–1.00)
GFR, Estimated: >60 mL/min (ref >60)
Glucose, Bld: 95 mg/dL (ref 70–99)
Potassium: 4.4 mmol/L (ref 3.5–5.1)
Sodium: 136 mmol/L (ref 135–145)

## 2023-03-23 LAB — CBC
HCT: 27.5 % — ABNORMAL LOW (ref 36.0–46.0)
Hemoglobin: 9.2 g/dL — ABNORMAL LOW (ref 12.0–15.0)
MCH: 32.2 pg (ref 26.0–34.0)
MCHC: 33.5 g/dL (ref 30.0–36.0)
MCV: 96.2 fL (ref 80.0–100.0)
Platelets: 390 10*3/uL (ref 150–400)
RBC: 2.86 MIL/uL — ABNORMAL LOW (ref 3.87–5.11)
RDW: 15.1 % (ref 11.5–15.5)
WBC: 7.9 10*3/uL (ref 4.0–10.5)
nRBC: 0.3 % — ABNORMAL HIGH (ref 0.0–0.2)

## 2023-03-23 NOTE — Progress Notes (Signed)
PROGRESS NOTE    Connie Wells  GNF:[id number] DOB: 1985/10/02 DOA: 03/18/2023 PCP: Default, Provider, MD   Brief Narrative: 38 year old with past medical history significant for breast cancer on Herceptin maintenance therapy, pulmonary nodule, liver lesion, generalized anxiety disorder present complaining of right side rib pain, intermittent associated with shortness of breath.  Evaluation in the ED she was found to be febrile temperature 102, chest x-ray dense dense consolidation within the right lung base.  CT chest new dense consolidation of margin of the right lower lobe with some sparing in the superior segment but multiple segmental involvement.  Appearance favor bacterial pneumonia pattern although pulmonary hemorrhage can have similar appearance.   Assessment & Plan:   Principal Problem:   Sepsis due to pneumonia [facility]) Active Problems:   History of breast cancer   AKI (acute kidney injury) (HCC)   GAD (generalized anxiety disorder)   CAP (community acquired pneumonia)   1-Sepsis secondary to Strep Pneumoniae Pneumonia -Patient presented with fever temperature 102, tachycardiac heart rate 126, tachypnea respiration rate 33, hypotension, source of infection pneumonia.  CT chest with finding consistent with consolidation of the right lower lobe with some spurring in the superior segment. -Strep pneumonia antigen positive -Blood cultures x 2 Strep Pneumoniae.  -Continue with guaifenesin as needed nebulizers -Denies hemoptysis -Monitor WBC, increased today. Afebrile. Continue with current antibiotics.  Repeated Blood culture 1/23. No growth to date.   ECHO, no vegetation, Ef 45--50 % Ceftriaxone change to Penicillin by ID Pulmonologist consulted. Recommend repeat ct chest in 4 weeks.  Awaiting final disposition per ID  Strep Pneumoniae bacteremia:  -Continue with IV ceftriaxone.  -ID consulted.   New Cardiomyopathy EF 45--50% HTN Suspect related to acute illness.  Also needs close follow up with her cardiologist. Melina Fiddler on chemo Started on Cozaar and carvedilol. BP better controlled.   Anemia;  Of malignancy and folic deficiency.  Started folic acid Monitor hb improved to 9  Right-sided chest wall pain secondary to pneumonia CT chest:New dense consolidation of much of the right lower lobe with some sparing in the superior segment but multi segmental involvement otherwise. Minimal spillover ground-glass opacity into the right middle lobe. Appearance favors bacterial pneumonia pattern although pulmonary hemorrhage can have a similar appearance.  -IV penicillin.   AKI: -Creatinine baseline 0.9 -Creatinine peaked to 1.2.   Resolved. Monitor on Cozaar.   Generalized anxiety disorder As needed Ativan  Metastatic breast cancer: Patient has breast cancer with metastasis to multiple side currently on maintenance therapy with Herceptin and Perjeta. -Added Dr. Pamelia Hoit on the treatment team  Hypokalemia: Replaced.  Hypomagnesemia; Replaced.     Estimated body mass index is 21.16 kg/m as calculated from the following:   Height as of this encounter: 5\' 6"  (1.676 m).   Weight as of this encounter: 59.5 kg.   DVT prophylaxis: Heparin Code Status: Full code Family Communication: Care discussed with patient Disposition Plan:  Status is: Inpatient Remains inpatient appropriate because: Management of pneumonia bacteremia    Consultants:  None  Procedures:  none  Antimicrobials:    Subjective: Breathing improved, pain better.   Objective: Vitals:   03/23/23 0509 03/23/23 0752 03/23/23 0911 03/23/23 1308  BP: (!) 137/92  (!) 134/94 132/88  Pulse: 95  (!) 101 96  Resp:    20  Temp: 98.3 F (36.8 C)  97.9 F (36.6 C) 98.8 F (37.1 C)  TempSrc: Oral  Oral Oral  SpO2: 97% 97% 97% 96%  Weight:  Height:        Intake/Output Summary (Last 24 hours) at 03/23/2023 1456 Last data filed at 03/23/2023 9562 Gross per 24 hour  Intake  1352 ml  Output --  Net 1352 ml   Filed Weights   03/18/23 1757 03/19/23 0228 03/20/23 0407  Weight: 56 kg 56 kg 59.5 kg    Examination:  General exam: NAD Respiratory system:  BL ronchus Cardiovascular system: S 1, S 2 RRR Gastrointestinal system: BS present, soft, nt Central nervous system: Non focal.  Extremities: no edema   Data Reviewed: I have personally reviewed following labs and imaging studies  CBC: Recent Labs  Lab 03/19/23 0444 03/20/23 0347 03/21/23 0102 03/22/23 0500 03/23/23 1220  WBC 8.6 21.5* 17.7* 9.4 7.9  HGB 9.1* 8.2* 7.6* 7.8* 9.2*  HCT 27.0* 24.4* 22.9* 24.3* 27.5*  MCV 96.1 96.1 98.3 98.8 96.2  PLT 245 216 253 295 390   Basic Metabolic Panel: Recent Labs  Lab 03/19/23 0444 03/20/23 0347 03/21/23 0102 03/22/23 0500 03/23/23 1220  NA 135 140 135 138 136  K 3.3* 3.1* 3.4* 4.6 4.4  CL 107 112* 109 109 104  CO2 21* 21* 19* 21* 27  GLUCOSE 94 101* 113* 102* 95  BUN 22* 16 13 9 9   CREATININE 1.23* 1.00 0.79 0.89 0.60  CALCIUM 7.6* 7.4* 7.5* 8.0* 8.4*  MG  --   --  1.7  --   --    GFR: Estimated Creatinine Clearance: 91 mL/min (by C-G formula based on SCr of 0.6 mg/dL). Liver Function Tests: Recent Labs  Lab 03/18/23 1931 03/19/23 0444  AST 20 14*  ALT 15 12  ALKPHOS 62 51  BILITOT 0.7 0.4  PROT 8.1 6.6  ALBUMIN 2.7* 2.1*   No results for input(s): "LIPASE", "AMYLASE" in the last 168 hours. No results for input(s): "AMMONIA" in the last 168 hours. Coagulation Profile: Recent Labs  Lab 03/19/23 0018  INR 1.1   Cardiac Enzymes: No results for input(s): "CKTOTAL", "CKMB", "CKMBINDEX", "TROPONINI" in the last 168 hours. BNP (last 3 results) No results for input(s): "PROBNP" in the last 8760 hours. HbA1C: No results for input(s): "HGBA1C" in the last 72 hours. CBG: No results for input(s): "GLUCAP" in the last 168 hours. Lipid Profile: Recent Labs    03/22/23 1135  CHOL 87  HDL <10*  LDLCALC NOT CALCULATED  TRIG 174*   CHOLHDL NOT CALCULATED   Thyroid Function Tests: No results for input(s): "TSH", "T4TOTAL", "FREET4", "T3FREE", "THYROIDAB" in the last 72 hours. Anemia Panel: Recent Labs    03/21/23 1020  VITAMINB12 1,029*  FOLATE 5.7*  FERRITIN 118  TIBC 206*  IRON 62  RETICCTPCT 0.5   Sepsis Labs: Recent Labs  Lab 03/19/23 0016 03/19/23 0556  LATICACIDVEN 0.9 1.6    Recent Results (from the past 240 hours)  Blood Culture (routine x 2)     Status: Abnormal   Collection Time: 03/19/23 12:05 AM   Specimen: BLOOD  Result Value Ref Range Status   Specimen Description   Final    BLOOD SITE NOT SPECIFIED Performed at [facility], [address], [place], [place] [address]    Special Requests   Final    BOTTLES DRAWN AEROBIC AND ANAEROBIC Blood Culture adequate volume Performed at [facility], [address] [place], [place] [address]    Culture  Setup Time   Final    GRAM POSITIVE COCCI IN CHAINS IN BOTH AEROBIC AND ANAEROBIC BOTTLES CRITICAL RESULT  CALLED TO, READ BACK BY AND VERIFIED WITH: PHARMD MELISSA JAMES ON 03/19/23 @ 1601 BY DRT Performed at [facility], [address], [place], [place] [address]    Culture STREPTOCOCCUS PNEUMONIAE (A)  Final   Report Status 03/21/2023 FINAL  Final   Organism ID, Bacteria STREPTOCOCCUS PNEUMONIAE  Final      Susceptibility   Streptococcus pneumoniae - MIC*    ERYTHROMYCIN <=0.12 SENSITIVE Sensitive     LEVOFLOXACIN 0.5 SENSITIVE Sensitive     VANCOMYCIN 0.5 SENSITIVE Sensitive     PENICILLIN (meningitis) <=0.06 SENSITIVE Sensitive     PENO - penicillin <=0.06      PENICILLIN (non-meningitis) <=0.06 SENSITIVE Sensitive     PENICILLIN (oral) <=0.06 SENSITIVE Sensitive     CEFTRIAXONE (non-meningitis) <=0.12 SENSITIVE Sensitive     CEFTRIAXONE (meningitis) <=0.12 SENSITIVE Sensitive     * STREPTOCOCCUS PNEUMONIAE  Blood Culture ID Panel (Reflexed)     Status: Abnormal   Collection Time:  03/19/23 12:05 AM  Result Value Ref Range Status   Enterococcus faecalis NOT DETECTED NOT DETECTED Final   Enterococcus Faecium NOT DETECTED NOT DETECTED Final   Listeria monocytogenes NOT DETECTED NOT DETECTED Final   Staphylococcus species NOT DETECTED NOT DETECTED Final   Staphylococcus aureus (BCID) NOT DETECTED NOT DETECTED Final   Staphylococcus epidermidis NOT DETECTED NOT DETECTED Final   Staphylococcus lugdunensis NOT DETECTED NOT DETECTED Final   Streptococcus species DETECTED (A) NOT DETECTED Final    Comment: CRITICAL RESULT CALLED TO, READ BACK BY AND VERIFIED WITH: PHARMD MELISSA JAMES [id number] AT 1601, DRT    Streptococcus agalactiae NOT DETECTED NOT DETECTED Final   Streptococcus pneumoniae DETECTED (A) NOT DETECTED Final    Comment: CRITICAL RESULT CALLED TO, READ BACK BY AND VERIFIED WITH: PHARMD MELISSA JAMES [id number] AT 1601, DRT    Streptococcus pyogenes NOT DETECTED NOT DETECTED Final   A.calcoaceticus-baumannii NOT DETECTED NOT DETECTED Final   Bacteroides fragilis NOT DETECTED NOT DETECTED Final   Enterobacterales NOT DETECTED NOT DETECTED Final   Enterobacter cloacae complex NOT DETECTED NOT DETECTED Final   Escherichia coli NOT DETECTED NOT DETECTED Final   Klebsiella aerogenes NOT DETECTED NOT DETECTED Final   Klebsiella oxytoca NOT DETECTED NOT DETECTED Final   Klebsiella pneumoniae NOT DETECTED NOT DETECTED Final   Proteus species NOT DETECTED NOT DETECTED Final   Salmonella species NOT DETECTED NOT DETECTED Final   Serratia marcescens NOT DETECTED NOT DETECTED Final   Haemophilus influenzae NOT DETECTED NOT DETECTED Final   Neisseria meningitidis NOT DETECTED NOT DETECTED Final   Pseudomonas aeruginosa NOT DETECTED NOT DETECTED Final   Stenotrophomonas maltophilia NOT DETECTED NOT DETECTED Final   Candida albicans NOT DETECTED NOT DETECTED Final   Candida auris NOT DETECTED NOT DETECTED Final   Candida glabrata NOT DETECTED NOT DETECTED Final   Candida  krusei NOT DETECTED NOT DETECTED Final   Candida parapsilosis NOT DETECTED NOT DETECTED Final   Candida tropicalis NOT DETECTED NOT DETECTED Final   Cryptococcus neoformans/gattii NOT DETECTED NOT DETECTED Final    Comment: Performed at [facility], [address] [place], [place] [address]  Blood Culture (routine x 2)     Status: Abnormal   Collection Time: 03/19/23 12:27 AM   Specimen: BLOOD RIGHT FOREARM  Result Value Ref Range Status   Specimen Description   Final    BLOOD RIGHT FOREARM Performed at [facility], [address], [place], [place] [address]    Special Requests  Final    BOTTLES DRAWN AEROBIC AND ANAEROBIC Blood Culture results may not be optimal due to an inadequate volume of blood received in culture bottles Performed at [facility] [address], [place], [place] [address]    Culture  Setup Time   Final    GRAM POSITIVE COCCI IN CHAINS IN BOTH AEROBIC AND ANAEROBIC BOTTLES    Culture (A)  Final    STREPTOCOCCUS PNEUMONIAE SUSCEPTIBILITIES PERFORMED ON PREVIOUS CULTURE WITHIN THE LAST 5 DAYS. Performed at [facility], [address] [place], [place] [address]    Report Status 03/21/2023 FINAL  Final  Respiratory (~20 pathogens) panel by PCR     Status: None   Collection Time: 03/19/23  1:05 AM   Specimen: Nasopharyngeal Swab; Respiratory  Result Value Ref Range Status   Adenovirus NOT DETECTED NOT DETECTED Final   Coronavirus 229E NOT DETECTED NOT DETECTED Final    Comment: (NOTE) The Coronavirus on the Respiratory Panel, DOES NOT test for the novel  Coronavirus (2019 nCoV)    Coronavirus HKU1 NOT DETECTED NOT DETECTED Final   Coronavirus NL63 NOT DETECTED NOT DETECTED Final   Coronavirus OC43 NOT DETECTED NOT DETECTED Final   Metapneumovirus NOT DETECTED NOT DETECTED Final   Rhinovirus / Enterovirus NOT DETECTED NOT DETECTED Final   Influenza A NOT DETECTED NOT DETECTED Final   Influenza B NOT DETECTED NOT  DETECTED Final   Parainfluenza Virus 1 NOT DETECTED NOT DETECTED Final   Parainfluenza Virus 2 NOT DETECTED NOT DETECTED Final   Parainfluenza Virus 3 NOT DETECTED NOT DETECTED Final   Parainfluenza Virus 4 NOT DETECTED NOT DETECTED Final   Respiratory Syncytial Virus NOT DETECTED NOT DETECTED Final   Bordetella pertussis NOT DETECTED NOT DETECTED Final   Bordetella Parapertussis NOT DETECTED NOT DETECTED Final   Chlamydophila pneumoniae NOT DETECTED NOT DETECTED Final   Mycoplasma pneumoniae NOT DETECTED NOT DETECTED Final    Comment: Performed at [facility], [address] [place], [place] [address]  Culture, blood (Routine X 2) w Reflex to ID Panel     Status: None (Preliminary result)   Collection Time: 03/20/23  4:09 PM   Specimen: BLOOD  Result Value Ref Range Status   Specimen Description   Final    BLOOD SITE NOT SPECIFIED Performed at [facility], [address], [place], [place] [address]    Special Requests   Final    BOTTLES DRAWN AEROBIC ONLY Blood Culture results may not be optimal due to an inadequate volume of blood received in culture bottles Performed at [facility], [address] [place], [place] [address]    Culture   Final    NO GROWTH 3 DAYS Performed at [facility], [address], [place], [place] [address]    Report Status PENDING  Incomplete  Culture, blood (Routine X 2) w Reflex to ID Panel     Status: None (Preliminary result)   Collection Time: 03/20/23  4:18 PM   Specimen: BLOOD LEFT ARM  Result Value Ref Range Status   Specimen Description   Final    BLOOD LEFT ARM Performed at [facility], [address] [place], [place] [address]    Special Requests   Final    BOTTLES DRAWN AEROBIC AND ANAEROBIC Blood Culture adequate volume Performed at [facility], [address], [place], [place] [address]    Culture   Final  NO GROWTH 3 DAYS Performed at [facility], [address], [place], [place] [address]    Report Status PENDING  Incomplete         Radiology Studies: No results found.       Scheduled Meds:  carvedilol  3.125 mg Oral BID WC   Chlorhexidine Gluconate Cloth  6 each Topical Daily   feeding supplement  237 mL Oral BID BM   folic acid  1 mg Oral Daily   guaiFENesin  1,200 mg Oral BID   ipratropium-albuterol  3 mL Nebulization BID   losartan  25 mg Oral Daily   ondansetron (ZOFRAN) IV  4 mg Intravenous Once   polyethylene glycol  17 g Oral Daily   senna-docusate  2 tablet Oral BID   sodium chloride flush  3 mL Intravenous Q12H   Continuous Infusions:  penicillin G potassium 12 Million Units in dextrose 5 % 500 mL CONTINUOUS infusion 12 Million Units (03/23/23 0915)     LOS: 4 days    Time spent: 35 minutes    Aeris Hersman A Coston Mandato, MD [facility]   If 7PM-7AM, please contact night-coverage [email or web address]  03/23/2023, 2:56 PM

## 2023-03-24 DIAGNOSIS — I071 Rheumatic tricuspid insufficiency: Secondary | ICD-10-CM

## 2023-03-24 DIAGNOSIS — T80219D Unspecified infection due to central venous catheter, subsequent encounter: Secondary | ICD-10-CM

## 2023-03-24 DIAGNOSIS — B953 Streptococcus pneumoniae as the cause of diseases classified elsewhere: Secondary | ICD-10-CM | POA: Diagnosis present

## 2023-03-24 DIAGNOSIS — R7881 Bacteremia: Secondary | ICD-10-CM | POA: Diagnosis present

## 2023-03-24 DIAGNOSIS — Z853 Personal history of malignant neoplasm of breast: Secondary | ICD-10-CM | POA: Diagnosis not present

## 2023-03-24 DIAGNOSIS — J189 Pneumonia, unspecified organism: Secondary | ICD-10-CM | POA: Diagnosis not present

## 2023-03-24 DIAGNOSIS — F411 Generalized anxiety disorder: Secondary | ICD-10-CM | POA: Diagnosis not present

## 2023-03-24 DIAGNOSIS — A419 Sepsis, unspecified organism: Secondary | ICD-10-CM | POA: Diagnosis not present

## 2023-03-24 HISTORY — DX: Streptococcus pneumoniae as the cause of diseases classified elsewhere: B95.3

## 2023-03-24 NOTE — Progress Notes (Signed)
Subjective: No new complaints   Antibiotics:  Anti-infectives (From admission, onward)    Start     Dose/Rate Route Frequency Ordered Stop   03/21/23 2200  penicillin G potassium 12 Million Units in dextrose 5 % 500 mL CONTINUOUS infusion        12 Million Units 41.7 mL/hr over 12 Hours Intravenous Every 12 hours 03/21/23 1054     03/20/23 0000  cefTRIAXone (ROCEPHIN) 2 g in sodium chloride 0.9 % 100 mL IVPB  Status:  Discontinued        2 g 200 mL/hr over 30 Minutes Intravenous Every 24 hours 03/19/23 0733 03/21/23 1054   03/19/23 2000  cefTRIAXone (ROCEPHIN) 2 g in sodium chloride 0.9 % 100 mL IVPB  Status:  Discontinued        2 g 200 mL/hr over 30 Minutes Intravenous Every 24 hours 03/19/23 0010 03/19/23 0518   03/19/23 0800  piperacillin-tazobactam (ZOSYN) IVPB 3.375 g  Status:  Discontinued        3.375 g 12.5 mL/hr over 240 Minutes Intravenous Every 8 hours 03/19/23 0534 03/19/23 0733   03/19/23 0600  vancomycin (VANCOCIN) IVPB 1000 mg/200 mL premix  Status:  Discontinued        1,000 mg 200 mL/hr over 60 Minutes Intravenous Every 24 hours 03/19/23 0534 03/19/23 0733   03/19/23 0015  doxycycline (VIBRAMYCIN) 100 mg in sodium chloride 0.9 % 250 mL IVPB  Status:  Discontinued        100 mg 125 mL/hr over 120 Minutes Intravenous Every 12 hours 03/19/23 0010 03/19/23 0518   03/18/23 2345  cefTRIAXone (ROCEPHIN) 2 g in sodium chloride 0.9 % 100 mL IVPB        2 g 200 mL/hr over 30 Minutes Intravenous  Once 03/18/23 2342 03/19/23 0049   03/18/23 2345  azithromycin (ZITHROMAX) 500 mg in sodium chloride 0.9 % 250 mL IVPB        500 mg 250 mL/hr over 60 Minutes Intravenous  Once 03/18/23 2342 03/19/23 0122       Medications: Scheduled Meds:  carvedilol  3.125 mg Oral BID WC   Chlorhexidine Gluconate Cloth  6 each Topical Daily   feeding supplement  237 mL Oral BID BM   folic acid  1 mg Oral Daily   guaiFENesin  1,200 mg Oral BID   ipratropium-albuterol  3 mL  Nebulization BID   losartan  25 mg Oral Daily   ondansetron (ZOFRAN) IV  4 mg Intravenous Once   polyethylene glycol  17 g Oral Daily   senna-docusate  2 tablet Oral BID   sodium chloride flush  3 mL Intravenous Q12H   Continuous Infusions:  penicillin G potassium 12 Million Units in dextrose 5 % 500 mL CONTINUOUS infusion 12 Million Units (03/24/23 0834)   PRN Meds:.bisacodyl, HYDROcodone-acetaminophen, levalbuterol, LORazepam, melatonin, morphine injection, ondansetron **OR** ondansetron (ZOFRAN) IV, mouth rinse, sodium chloride flush, sodium chloride flush    Objective: Weight change:   Intake/Output Summary (Last 24 hours) at 03/24/2023 1210 Last data filed at 03/24/2023 0600 Gross per 24 hour  Intake 1284.13 ml  Output --  Net 1284.13 ml   Blood pressure 126/83, pulse 87, temperature 99 F (37.2 C), temperature source Oral, resp. rate 20, height 5\' 6"  (1.676 m), weight 59.5 kg, last menstrual period 03/17/2023, SpO2 96%. Temp:  [98.8 F (37.1 C)-99.5 F (37.5 C)] 99 F (37.2 C) (01/27 0433) Pulse Rate:  [87-96] 87 (01/27 0433) Resp:  [  20] 20 (01/27 0433) BP: (126-142)/(83-96) 126/83 (01/27 0433) SpO2:  [96 %-100 %] 96 % (01/27 0950)  Physical Exam: Physical Exam Constitutional:      General: She is not in acute distress.    Appearance: She is well-developed. She is not diaphoretic.  HENT:     Head: Normocephalic and atraumatic.     Right Ear: External ear normal.     Left Ear: External ear normal.     Mouth/Throat:     Pharynx: No oropharyngeal exudate.  Eyes:     General: No scleral icterus.    Conjunctiva/sclera: Conjunctivae normal.     Pupils: Pupils are equal, round, and reactive to light.  Cardiovascular:     Rate and Rhythm: Normal rate and regular rhythm.     Heart sounds: Normal heart sounds. No murmur heard.    No friction rub. No gallop.  Pulmonary:     Effort: Pulmonary effort is normal. No respiratory distress.     Breath sounds: Examination of  the right-middle field reveals decreased breath sounds. Examination of the right-lower field reveals decreased breath sounds. Decreased breath sounds present. No wheezing.  Abdominal:     General: Bowel sounds are normal. There is no distension.     Palpations: Abdomen is soft.     Tenderness: There is no abdominal tenderness. There is no rebound.  Musculoskeletal:        General: No tenderness. Normal range of motion.  Lymphadenopathy:     Cervical: No cervical adenopathy.  Skin:    General: Skin is warm and dry.     Coloration: Skin is not pale.     Findings: No erythema or rash.  Neurological:     General: No focal deficit present.     Mental Status: She is alert and oriented to person, place, and time.     Motor: No abnormal muscle tone.     Coordination: Coordination normal.  Psychiatric:        Mood and Affect: Mood normal.        Behavior: Behavior normal.        Thought Content: Thought content normal.        Judgment: Judgment normal.      CBC:    BMET Recent Labs    03/22/23 0500 03/23/23 1220  NA 138 136  K 4.6 4.4  CL 109 104  CO2 21* 27  GLUCOSE 102* 95  BUN 9 9  CREATININE 0.89 0.60  CALCIUM 8.0* 8.4*     Liver Panel  No results for input(s): "PROT", "ALBUMIN", "AST", "ALT", "ALKPHOS", "BILITOT", "BILIDIR", "IBILI" in the last 72 hours.     Sedimentation Rate No results for input(s): "ESRSEDRATE" in the last 72 hours. C-Reactive Protein No results for input(s): "CRP" in the last 72 hours.  Micro Results: Recent Results (from the past 720 hours)  Blood Culture (routine x 2)     Status: Abnormal   Collection Time: 03/19/23 12:05 AM   Specimen: BLOOD  Result Value Ref Range Status   Specimen Description   Final    BLOOD SITE NOT SPECIFIED Performed at [facility], [address], [place], [place] [address]    Special Requests   Final    BOTTLES DRAWN AEROBIC AND ANAEROBIC Blood Culture adequate volume Performed at  [facility], [address], [place], [place] [address]    Culture  Setup Time   Final    GRAM POSITIVE COCCI IN CHAINS IN BOTH  AEROBIC AND ANAEROBIC BOTTLES CRITICAL RESULT CALLED TO, READ BACK BY AND VERIFIED WITH: PHARMD MELISSA JAMES ON 03/19/23 @ 1601 BY DRT Performed at [facility], [address] [place], [place] [address]    Culture STREPTOCOCCUS PNEUMONIAE (A)  Final   Report Status 03/21/2023 FINAL  Final   Organism ID, Bacteria STREPTOCOCCUS PNEUMONIAE  Final      Susceptibility   Streptococcus pneumoniae - MIC*    ERYTHROMYCIN <=0.12 SENSITIVE Sensitive     LEVOFLOXACIN 0.5 SENSITIVE Sensitive     VANCOMYCIN 0.5 SENSITIVE Sensitive     PENICILLIN (meningitis) <=0.06 SENSITIVE Sensitive     PENO - penicillin <=0.06      PENICILLIN (non-meningitis) <=0.06 SENSITIVE Sensitive     PENICILLIN (oral) <=0.06 SENSITIVE Sensitive     CEFTRIAXONE (non-meningitis) <=0.12 SENSITIVE Sensitive     CEFTRIAXONE (meningitis) <=0.12 SENSITIVE Sensitive     * STREPTOCOCCUS PNEUMONIAE  Blood Culture ID Panel (Reflexed)     Status: Abnormal   Collection Time: 03/19/23 12:05 AM  Result Value Ref Range Status   Enterococcus faecalis NOT DETECTED NOT DETECTED Final   Enterococcus Faecium NOT DETECTED NOT DETECTED Final   Listeria monocytogenes NOT DETECTED NOT DETECTED Final   Staphylococcus species NOT DETECTED NOT DETECTED Final   Staphylococcus aureus (BCID) NOT DETECTED NOT DETECTED Final   Staphylococcus epidermidis NOT DETECTED NOT DETECTED Final   Staphylococcus lugdunensis NOT DETECTED NOT DETECTED Final   Streptococcus species DETECTED (A) NOT DETECTED Final    Comment: CRITICAL RESULT CALLED TO, READ BACK BY AND VERIFIED WITH: PHARMD MELISSA JAMES [id number] AT 1601, DRT    Streptococcus agalactiae NOT DETECTED NOT DETECTED Final   Streptococcus pneumoniae DETECTED (A) NOT DETECTED Final    Comment: CRITICAL RESULT CALLED TO, READ BACK BY AND VERIFIED  WITH: PHARMD MELISSA JAMES [id number] AT 1601, DRT    Streptococcus pyogenes NOT DETECTED NOT DETECTED Final   A.calcoaceticus-baumannii NOT DETECTED NOT DETECTED Final   Bacteroides fragilis NOT DETECTED NOT DETECTED Final   Enterobacterales NOT DETECTED NOT DETECTED Final   Enterobacter cloacae complex NOT DETECTED NOT DETECTED Final   Escherichia coli NOT DETECTED NOT DETECTED Final   Klebsiella aerogenes NOT DETECTED NOT DETECTED Final   Klebsiella oxytoca NOT DETECTED NOT DETECTED Final   Klebsiella pneumoniae NOT DETECTED NOT DETECTED Final   Proteus species NOT DETECTED NOT DETECTED Final   Salmonella species NOT DETECTED NOT DETECTED Final   Serratia marcescens NOT DETECTED NOT DETECTED Final   Haemophilus influenzae NOT DETECTED NOT DETECTED Final   Neisseria meningitidis NOT DETECTED NOT DETECTED Final   Pseudomonas aeruginosa NOT DETECTED NOT DETECTED Final   Stenotrophomonas maltophilia NOT DETECTED NOT DETECTED Final   Candida albicans NOT DETECTED NOT DETECTED Final   Candida auris NOT DETECTED NOT DETECTED Final   Candida glabrata NOT DETECTED NOT DETECTED Final   Candida krusei NOT DETECTED NOT DETECTED Final   Candida parapsilosis NOT DETECTED NOT DETECTED Final   Candida tropicalis NOT DETECTED NOT DETECTED Final   Cryptococcus neoformans/gattii NOT DETECTED NOT DETECTED Final    Comment: Performed at [facility], [address], [place], [place] [address]  Blood Culture (routine x 2)     Status: Abnormal   Collection Time: 03/19/23 12:27 AM   Specimen: BLOOD RIGHT FOREARM  Result Value Ref Range Status   Specimen Description   Final    BLOOD RIGHT FOREARM Performed at [facility], [address] [place], [place] [address]  Special Requests   Final    BOTTLES DRAWN AEROBIC AND ANAEROBIC Blood Culture results may not be optimal due to an inadequate volume of blood received in culture bottles Performed at [facility], [address] [place], [place] [address]    Culture  Setup Time   Final    GRAM POSITIVE COCCI IN CHAINS IN BOTH AEROBIC AND ANAEROBIC BOTTLES    Culture (A)  Final    STREPTOCOCCUS PNEUMONIAE SUSCEPTIBILITIES PERFORMED ON PREVIOUS CULTURE WITHIN THE LAST 5 DAYS. Performed at [facility], [address] [place], [place] [address]    Report Status 03/21/2023 FINAL  Final  Respiratory (~20 pathogens) panel by PCR     Status: None   Collection Time: 03/19/23  1:05 AM   Specimen: Nasopharyngeal Swab; Respiratory  Result Value Ref Range Status   Adenovirus NOT DETECTED NOT DETECTED Final   Coronavirus 229E NOT DETECTED NOT DETECTED Final    Comment: (NOTE) The Coronavirus on the Respiratory Panel, DOES NOT test for the novel  Coronavirus (2019 nCoV)    Coronavirus HKU1 NOT DETECTED NOT DETECTED Final   Coronavirus NL63 NOT DETECTED NOT DETECTED Final   Coronavirus OC43 NOT DETECTED NOT DETECTED Final   Metapneumovirus NOT DETECTED NOT DETECTED Final   Rhinovirus / Enterovirus NOT DETECTED NOT DETECTED Final   Influenza A NOT DETECTED NOT DETECTED Final   Influenza B NOT DETECTED NOT DETECTED Final   Parainfluenza Virus 1 NOT DETECTED NOT DETECTED Final   Parainfluenza Virus 2 NOT DETECTED NOT DETECTED Final   Parainfluenza Virus 3 NOT DETECTED NOT DETECTED Final   Parainfluenza Virus 4 NOT DETECTED NOT DETECTED Final   Respiratory Syncytial Virus NOT DETECTED NOT DETECTED Final   Bordetella pertussis NOT DETECTED NOT DETECTED Final   Bordetella Parapertussis NOT DETECTED NOT DETECTED Final   Chlamydophila pneumoniae NOT DETECTED NOT DETECTED Final   Mycoplasma pneumoniae NOT DETECTED NOT DETECTED Final    Comment: Performed at [facility], [address], [place], [place] [address]  Culture, blood (Routine X 2) w Reflex to ID Panel     Status: None (Preliminary result)   Collection Time: 03/20/23  4:09 PM   Specimen: BLOOD  Result Value Ref Range Status   Specimen  Description   Final    BLOOD SITE NOT SPECIFIED Performed at [facility], [address] [place], [place] [address]    Special Requests   Final    BOTTLES DRAWN AEROBIC ONLY Blood Culture results may not be optimal due to an inadequate volume of blood received in culture bottles Performed at [facility], [address] [place], [place] [address]    Culture   Final    NO GROWTH 4 DAYS Performed at [facility], [address] [place], [place] [address]    Report Status PENDING  Incomplete  Culture, blood (Routine X 2) w Reflex to ID Panel     Status: None (Preliminary result)   Collection Time: 03/20/23  4:18 PM   Specimen: BLOOD LEFT ARM  Result Value Ref Range Status   Specimen Description   Final    BLOOD LEFT ARM Performed at [facility], [address] [place], [place] [address]    Special Requests   Final    BOTTLES DRAWN AEROBIC AND ANAEROBIC Blood Culture adequate volume Performed at [facility], [address], [place], [place] [address]    Culture  Final    NO GROWTH 4 DAYS Performed at [facility], [address] [place], [place] [address]    Report Status PENDING  Incomplete    Studies/Results: No results found.    Assessment/Plan:  INTERVAL HISTORY: TTE with TVR --moderate to severe   Principal Problem:   Pneumococcal bacteremia Active Problems:   Sepsis due to pneumonia (HCC)   AKI (acute kidney injury) (HCC)   History of breast cancer   GAD (generalized anxiety disorder)   CAP (community acquired pneumonia)    Connie Wells is a 38 y.o. female with metastatic breast cancer on chemotherapy via port, admitted with pneumococcal bacteremia, sepsis and pneumonia. Her TTE shows TVR which appears new  #1 Pneumococcal bacteremia from pneumonia  With valvular absnormalitiy on TTE need TEE, and I have called Cardiology to arrange this  We also need to consider  PORT removal --certainly if she has endocarditis  Pneumococcus is not as "sticky" to plastic in ports as for example Staph aureus but it could have seeded the port  We will continue high dose PCN and await TEE first  I will update her Mom over phone per her request  I have personally spent 54 minutes involved in face-to-face and non-face-to-face activities for this patient on the day of the visit. Professional time spent includes the following activities: Preparing to see the patient (review of tests), Obtaining and/or reviewing separately obtained history (admission/discharge record), Performing a medically appropriate examination and/or evaluation , Ordering medications/tests/procedures, referring and communicating with other health care professionals, Documenting clinical information in the EMR, Independently interpreting results (not separately reported), Communicating results to the patient/family/caregiver, Counseling and educating the patient/family/caregiver and Care coordination (not separately reported).   Seeing the patient involved additionally evaluation of complex therapy decisions, and counseling, isolation needs for disease transmission, risk assessment and mitigation    LOS: 5 days   Acey Lav 03/24/2023, 12:10 PM

## 2023-03-24 NOTE — Progress Notes (Signed)
PROGRESS NOTE    Connie Wells  ZOX:[id number] DOB: 1985/04/11 DOA: 03/18/2023 PCP: Default, Provider, MD   Brief Narrative: 38 year old with past medical history significant for breast cancer on Herceptin maintenance therapy, pulmonary nodule, liver lesion, generalized anxiety disorder present complaining of right side rib pain, intermittent associated with shortness of breath.  Evaluation in the ED she was found to be febrile temperature 102, chest x-ray dense dense consolidation within the right lung base.  CT chest new dense consolidation of margin of the right lower lobe with some sparing in the superior segment but multiple segmental involvement.  Appearance favor bacterial pneumonia pattern although pulmonary hemorrhage can have similar appearance.   Assessment & Plan:   Principal Problem:   Pneumococcal bacteremia Active Problems:   Sepsis due to pneumonia (HCC)   History of breast cancer   AKI (acute kidney injury) (HCC)   GAD (generalized anxiety disorder)   CAP (community acquired pneumonia)   1-Sepsis secondary to Strep Pneumoniae Pneumonia -Patient presented with fever temperature 102, tachycardiac heart rate 126, tachypnea respiration rate 33, hypotension, source of infection pneumonia.  CT chest with finding consistent with consolidation of the right lower lobe with some spurring in the superior segment. -Strep pneumonia antigen positive -Blood cultures x 2 Strep Pneumoniae.  -Continue with guaifenesin as needed nebulizers -Denies hemoptysis -Monitor WBC, increased today. Afebrile. Continue with current antibiotics.  Repeated Blood culture 1/23. No growth to date.   ECHO, no vegetation, Ef 45--50 % Ceftriaxone change to Penicillin by ID Pulmonologist consulted. Recommend repeat ct chest in 4 weeks.  Awaiting final disposition per ID Needs TEE  Strep Pneumoniae bacteremia:  -Continue with IV ceftriaxone.  -ID consulted.   New Cardiomyopathy EF  45--50% HTN Suspect related to acute illness. Also needs close follow up with her cardiologist. Melina Fiddler on chemo Started on Cozaar and carvedilol. BP better controlled.  Needs follow up with cardiologist   Anemia;  Of malignancy and folic deficiency.  Started folic acid Monitor hb improved to 9  Right-sided chest wall pain secondary to pneumonia CT chest:New dense consolidation of much of the right lower lobe with some sparing in the superior segment but multi segmental involvement otherwise. Minimal spillover ground-glass opacity into the right middle lobe. Appearance favors bacterial pneumonia pattern although pulmonary hemorrhage can have a similar appearance.  -IV penicillin.   AKI: -Creatinine baseline 0.9 -Creatinine peaked to 1.2.   Resolved. Monitor on Cozaar.   Generalized anxiety disorder As needed Ativan  Metastatic breast cancer: Patient has breast cancer with metastasis to multiple side currently on maintenance therapy with Herceptin and Perjeta. -Added Dr. Pamelia Hoit on the treatment team  Hypokalemia: Replaced.  Hypomagnesemia; Replaced.     Estimated body mass index is 21.16 kg/m as calculated from the following:   Height as of this encounter: 5\' 6"  (1.676 m).   Weight as of this encounter: 59.5 kg.   DVT prophylaxis: Heparin Code Status: Full code Family Communication: Care discussed with patient Disposition Plan:  Status is: Inpatient Remains inpatient appropriate because: Management of pneumonia bacteremia    Consultants:  None  Procedures:  none  Antimicrobials:    Subjective: Breathing better.  Denies pain. She is aware she needs TEE  Objective: Vitals:   03/23/23 1308 03/23/23 2042 03/24/23 0433 03/24/23 0950  BP: 132/88 (!) 142/96 126/83   Pulse: 96 92 87   Resp: 20 20 20    Temp: 98.8 F (37.1 C) 99.5 F (37.5 C) 99 F (37.2 C)   TempSrc:  Oral Oral Oral   SpO2: 96% 100% 99% 96%  Weight:      Height:        Intake/Output  Summary (Last 24 hours) at 03/24/2023 1458 Last data filed at 03/24/2023 0600 Gross per 24 hour  Intake 1284.13 ml  Output --  Net 1284.13 ml   Filed Weights   03/18/23 1757 03/19/23 0228 03/20/23 0407  Weight: 56 kg 56 kg 59.5 kg    Examination:  General exam: NAD Respiratory system: CTA Cardiovascular system:S 1, S 2 RRR Gastrointestinal system: BS present, soft, nt Central nervous system: Non focal.   Extremities: No edema   Data Reviewed: I have personally reviewed following labs and imaging studies  CBC: Recent Labs  Lab 03/19/23 0444 03/20/23 0347 03/21/23 0102 03/22/23 0500 03/23/23 1220  WBC 8.6 21.5* 17.7* 9.4 7.9  HGB 9.1* 8.2* 7.6* 7.8* 9.2*  HCT 27.0* 24.4* 22.9* 24.3* 27.5*  MCV 96.1 96.1 98.3 98.8 96.2  PLT 245 216 253 295 390   Basic Metabolic Panel: Recent Labs  Lab 03/19/23 0444 03/20/23 0347 03/21/23 0102 03/22/23 0500 03/23/23 1220  NA 135 140 135 138 136  K 3.3* 3.1* 3.4* 4.6 4.4  CL 107 112* 109 109 104  CO2 21* 21* 19* 21* 27  GLUCOSE 94 101* 113* 102* 95  BUN 22* 16 13 9 9   CREATININE 1.23* 1.00 0.79 0.89 0.60  CALCIUM 7.6* 7.4* 7.5* 8.0* 8.4*  MG  --   --  1.7  --   --    GFR: Estimated Creatinine Clearance: 91 mL/min (by C-G formula based on SCr of 0.6 mg/dL). Liver Function Tests: Recent Labs  Lab 03/18/23 1931 03/19/23 0444  AST 20 14*  ALT 15 12  ALKPHOS 62 51  BILITOT 0.7 0.4  PROT 8.1 6.6  ALBUMIN 2.7* 2.1*   No results for input(s): "LIPASE", "AMYLASE" in the last 168 hours. No results for input(s): "AMMONIA" in the last 168 hours. Coagulation Profile: Recent Labs  Lab 03/19/23 0018  INR 1.1   Cardiac Enzymes: No results for input(s): "CKTOTAL", "CKMB", "CKMBINDEX", "TROPONINI" in the last 168 hours. BNP (last 3 results) No results for input(s): "PROBNP" in the last 8760 hours. HbA1C: No results for input(s): "HGBA1C" in the last 72 hours. CBG: No results for input(s): "GLUCAP" in the last 168  hours. Lipid Profile: Recent Labs    03/22/23 1135  CHOL 87  HDL <10*  LDLCALC NOT CALCULATED  TRIG 174*  CHOLHDL NOT CALCULATED   Thyroid Function Tests: No results for input(s): "TSH", "T4TOTAL", "FREET4", "T3FREE", "THYROIDAB" in the last 72 hours. Anemia Panel: No results for input(s): "VITAMINB12", "FOLATE", "FERRITIN", "TIBC", "IRON", "RETICCTPCT" in the last 72 hours.  Sepsis Labs: Recent Labs  Lab 03/19/23 0016 03/19/23 0556  LATICACIDVEN 0.9 1.6    Recent Results (from the past 240 hours)  Blood Culture (routine x 2)     Status: Abnormal   Collection Time: 03/19/23 12:05 AM   Specimen: BLOOD  Result Value Ref Range Status   Specimen Description   Final    BLOOD SITE NOT SPECIFIED Performed at [facility], [address] [place], [place] [address]    Special Requests   Final    BOTTLES DRAWN AEROBIC AND ANAEROBIC Blood Culture adequate volume Performed at [facility], [address], [place], [place] [address]    Culture  Setup Time   Final    GRAM POSITIVE COCCI IN CHAINS IN BOTH AEROBIC AND  ANAEROBIC BOTTLES CRITICAL RESULT CALLED TO, READ BACK BY AND VERIFIED WITH: PHARMD MELISSA JAMES ON 03/19/23 @ 1601 BY DRT Performed at [facility], [address], [place], [place] [address]    Culture STREPTOCOCCUS PNEUMONIAE (A)  Final   Report Status 03/21/2023 FINAL  Final   Organism ID, Bacteria STREPTOCOCCUS PNEUMONIAE  Final      Susceptibility   Streptococcus pneumoniae - MIC*    ERYTHROMYCIN <=0.12 SENSITIVE Sensitive     LEVOFLOXACIN 0.5 SENSITIVE Sensitive     VANCOMYCIN 0.5 SENSITIVE Sensitive     PENICILLIN (meningitis) <=0.06 SENSITIVE Sensitive     PENO - penicillin <=0.06      PENICILLIN (non-meningitis) <=0.06 SENSITIVE Sensitive     PENICILLIN (oral) <=0.06 SENSITIVE Sensitive     CEFTRIAXONE (non-meningitis) <=0.12 SENSITIVE Sensitive     CEFTRIAXONE (meningitis) <=0.12 SENSITIVE Sensitive     *  STREPTOCOCCUS PNEUMONIAE  Blood Culture ID Panel (Reflexed)     Status: Abnormal   Collection Time: 03/19/23 12:05 AM  Result Value Ref Range Status   Enterococcus faecalis NOT DETECTED NOT DETECTED Final   Enterococcus Faecium NOT DETECTED NOT DETECTED Final   Listeria monocytogenes NOT DETECTED NOT DETECTED Final   Staphylococcus species NOT DETECTED NOT DETECTED Final   Staphylococcus aureus (BCID) NOT DETECTED NOT DETECTED Final   Staphylococcus epidermidis NOT DETECTED NOT DETECTED Final   Staphylococcus lugdunensis NOT DETECTED NOT DETECTED Final   Streptococcus species DETECTED (A) NOT DETECTED Final    Comment: CRITICAL RESULT CALLED TO, READ BACK BY AND VERIFIED WITH: PHARMD MELISSA JAMES [id number] AT 1601, DRT    Streptococcus agalactiae NOT DETECTED NOT DETECTED Final   Streptococcus pneumoniae DETECTED (A) NOT DETECTED Final    Comment: CRITICAL RESULT CALLED TO, READ BACK BY AND VERIFIED WITH: PHARMD MELISSA JAMES [id number] AT 1601, DRT    Streptococcus pyogenes NOT DETECTED NOT DETECTED Final   A.calcoaceticus-baumannii NOT DETECTED NOT DETECTED Final   Bacteroides fragilis NOT DETECTED NOT DETECTED Final   Enterobacterales NOT DETECTED NOT DETECTED Final   Enterobacter cloacae complex NOT DETECTED NOT DETECTED Final   Escherichia coli NOT DETECTED NOT DETECTED Final   Klebsiella aerogenes NOT DETECTED NOT DETECTED Final   Klebsiella oxytoca NOT DETECTED NOT DETECTED Final   Klebsiella pneumoniae NOT DETECTED NOT DETECTED Final   Proteus species NOT DETECTED NOT DETECTED Final   Salmonella species NOT DETECTED NOT DETECTED Final   Serratia marcescens NOT DETECTED NOT DETECTED Final   Haemophilus influenzae NOT DETECTED NOT DETECTED Final   Neisseria meningitidis NOT DETECTED NOT DETECTED Final   Pseudomonas aeruginosa NOT DETECTED NOT DETECTED Final   Stenotrophomonas maltophilia NOT DETECTED NOT DETECTED Final   Candida albicans NOT DETECTED NOT DETECTED Final   Candida  auris NOT DETECTED NOT DETECTED Final   Candida glabrata NOT DETECTED NOT DETECTED Final   Candida krusei NOT DETECTED NOT DETECTED Final   Candida parapsilosis NOT DETECTED NOT DETECTED Final   Candida tropicalis NOT DETECTED NOT DETECTED Final   Cryptococcus neoformans/gattii NOT DETECTED NOT DETECTED Final    Comment: Performed at [facility], [address] [place], [place] [address]  Blood Culture (routine x 2)     Status: Abnormal   Collection Time: 03/19/23 12:27 AM   Specimen: BLOOD RIGHT FOREARM  Result Value Ref Range Status   Specimen Description   Final    BLOOD RIGHT FOREARM Performed at [facility], [address] [place], [place] [address]  Special Requests   Final    BOTTLES DRAWN AEROBIC AND ANAEROBIC Blood Culture results may not be optimal due to an inadequate volume of blood received in culture bottles Performed at [facility], [address] [place], [place] [address]    Culture  Setup Time   Final    GRAM POSITIVE COCCI IN CHAINS IN BOTH AEROBIC AND ANAEROBIC BOTTLES    Culture (A)  Final    STREPTOCOCCUS PNEUMONIAE SUSCEPTIBILITIES PERFORMED ON PREVIOUS CULTURE WITHIN THE LAST 5 DAYS. Performed at [facility], [address] [place], [place] [address]    Report Status 03/21/2023 FINAL  Final  Respiratory (~20 pathogens) panel by PCR     Status: None   Collection Time: 03/19/23  1:05 AM   Specimen: Nasopharyngeal Swab; Respiratory  Result Value Ref Range Status   Adenovirus NOT DETECTED NOT DETECTED Final   Coronavirus 229E NOT DETECTED NOT DETECTED Final    Comment: (NOTE) The Coronavirus on the Respiratory Panel, DOES NOT test for the novel  Coronavirus (2019 nCoV)    Coronavirus HKU1 NOT DETECTED NOT DETECTED Final   Coronavirus NL63 NOT DETECTED NOT DETECTED Final   Coronavirus OC43 NOT DETECTED NOT DETECTED Final   Metapneumovirus NOT DETECTED NOT DETECTED Final   Rhinovirus / Enterovirus NOT DETECTED  NOT DETECTED Final   Influenza A NOT DETECTED NOT DETECTED Final   Influenza B NOT DETECTED NOT DETECTED Final   Parainfluenza Virus 1 NOT DETECTED NOT DETECTED Final   Parainfluenza Virus 2 NOT DETECTED NOT DETECTED Final   Parainfluenza Virus 3 NOT DETECTED NOT DETECTED Final   Parainfluenza Virus 4 NOT DETECTED NOT DETECTED Final   Respiratory Syncytial Virus NOT DETECTED NOT DETECTED Final   Bordetella pertussis NOT DETECTED NOT DETECTED Final   Bordetella Parapertussis NOT DETECTED NOT DETECTED Final   Chlamydophila pneumoniae NOT DETECTED NOT DETECTED Final   Mycoplasma pneumoniae NOT DETECTED NOT DETECTED Final    Comment: Performed at [facility], [address] [place], [place] [address]  Culture, blood (Routine X 2) w Reflex to ID Panel     Status: None (Preliminary result)   Collection Time: 03/20/23  4:09 PM   Specimen: BLOOD  Result Value Ref Range Status   Specimen Description   Final    BLOOD SITE NOT SPECIFIED Performed at [facility], [address], [place], [place] [address]    Special Requests   Final    BOTTLES DRAWN AEROBIC ONLY Blood Culture results may not be optimal due to an inadequate volume of blood received in culture bottles Performed at [facility], [address] [place], [place] [address]    Culture   Final    NO GROWTH 4 DAYS Performed at [facility], [address] [place], [place] [address]    Report Status PENDING  Incomplete  Culture, blood (Routine X 2) w Reflex to ID Panel     Status: None (Preliminary result)   Collection Time: 03/20/23  4:18 PM   Specimen: BLOOD LEFT ARM  Result Value Ref Range Status   Specimen Description   Final    BLOOD LEFT ARM Performed at [facility], [address] [place], [place] [address]    Special Requests   Final    BOTTLES DRAWN AEROBIC AND ANAEROBIC Blood Culture adequate volume Performed at [facility], [address], [place], [place] [address]    Culture  Final    NO GROWTH 4 DAYS Performed at [facility], [address] [place], [place] [address]    Report Status PENDING  Incomplete         Radiology Studies: No results found.       Scheduled Meds:  carvedilol  3.125 mg Oral BID WC   Chlorhexidine Gluconate Cloth  6 each Topical Daily   feeding supplement  237 mL Oral BID BM   folic acid  1 mg Oral Daily   guaiFENesin  1,200 mg Oral BID   ipratropium-albuterol  3 mL Nebulization BID   losartan  25 mg Oral Daily   ondansetron (ZOFRAN) IV  4 mg Intravenous Once   polyethylene glycol  17 g Oral Daily   senna-docusate  2 tablet Oral BID   sodium chloride flush  3 mL Intravenous Q12H   Continuous Infusions:  penicillin G potassium 12 Million Units in dextrose 5 % 500 mL CONTINUOUS infusion 12 Million Units (03/24/23 0834)     LOS: 5 days    Time spent: 35 minutes    Chastidy Ranker A Dominique Calvey, MD [facility]   If 7PM-7AM, please contact night-coverage [email or web address]  03/24/2023, 2:58 PM

## 2023-03-24 NOTE — H&P (View-Only) (Signed)
   [facility] has been requested to perform a transesophageal echocardiogram on Connie Wells for bacteremia.    The patient does NOT have any absolute or relative contraindications to a Transesophageal Echocardiogram (TEE).  The patient has: History of mildly reduced ejection fraction.  After careful review of history and examination, the risks and benefits of transesophageal echocardiogram have been explained including risks of esophageal damage, perforation (1:10,000 risk), bleeding, pharyngeal hematoma as well as other potential complications associated with conscious sedation including aspiration, arrhythmia, respiratory failure and death. Alternatives to treatment were discussed, questions were answered. Patient is willing to proceed.   Leanne Lovely, PA-C  03/24/2023 12:59 PM

## 2023-03-24 NOTE — Telephone Encounter (Signed)
Tried to call PT. LVM.

## 2023-03-24 NOTE — Anesthesia Preprocedure Evaluation (Signed)
Anesthesia Evaluation  Patient identified by MRN, date of birth, ID band Patient awake    Reviewed: Allergy & Precautions, H&P , NPO status , Patient's Chart, lab work & pertinent test results  Airway Mallampati: I  TM Distance: >3 FB Neck ROM: Full    Dental no notable dental hx. (+) Teeth Intact, Dental Advisory Given, Chipped   Pulmonary Current Smoker   Pulmonary exam normal breath sounds clear to auscultation       Cardiovascular Exercise Tolerance: Good negative cardio ROS Normal cardiovascular exam Rhythm:Regular Rate:Normal  ECHO 1/21  1. Left ventricular ejection fraction, by estimation, is 45 to 50%. The left ventricle has mildly decreased function. The left ventricle has no regional wall motion abnormalities. Left ventricular diastolic parameters were normal.  2. Right ventricular systolic function is normal. The right ventricular size is normal. There is mildly elevated pulmonary artery systolic pressure.  3. The mitral valve is normal in structure. Mild to moderate mitral valve regurgitation. No evidence of mitral stenosis.  4. Tricuspid valve regurgitation is moderate to severe.  5. The aortic valve is tricuspid. Aortic valve regurgitation is not visualized. No aortic stenosis is present.  6. The inferior vena cava is dilated in size with >50% respiratory variability, suggesting right atrial pressure of 8 mmHg.    Neuro/Psych  PSYCHIATRIC DISORDERS Anxiety     negative neurological ROS     GI/Hepatic negative GI ROS, Neg liver ROS,,,  Endo/Other  negative endocrine ROS    Renal/GU negative Renal ROS  negative genitourinary   Musculoskeletal negative musculoskeletal ROS (+)    Abdominal   Peds negative pediatric ROS (+)  Hematology negative hematology ROS (+) Blood dyscrasia, anemia   Anesthesia Other Findings   Reproductive/Obstetrics negative OB ROS                              Anesthesia Physical Anesthesia Plan  ASA: 3  Anesthesia Plan: MAC   Post-op Pain Management: Minimal or no pain anticipated   Induction: Intravenous  PONV Risk Score and Plan: 1 and Treatment may vary due to age or medical condition  Airway Management Planned: Natural Airway, Simple Face Mask and Mask  Additional Equipment: None  Intra-op Plan:   Post-operative Plan:   Informed Consent: I have reviewed the patients History and Physical, chart, labs and discussed the procedure including the risks, benefits and alternatives for the proposed anesthesia with the patient or authorized representative who has indicated his/her understanding and acceptance.       Plan Discussed with: CRNA and Anesthesiologist  Anesthesia Plan Comments:        Anesthesia Quick Evaluation

## 2023-03-24 NOTE — Progress Notes (Signed)
   [facility] has been requested to perform a transesophageal echocardiogram on Leiani R Vanzee for bacteremia.    The patient does NOT have any absolute or relative contraindications to a Transesophageal Echocardiogram (TEE).  The patient has: History of mildly reduced ejection fraction.  After careful review of history and examination, the risks and benefits of transesophageal echocardiogram have been explained including risks of esophageal damage, perforation (1:10,000 risk), bleeding, pharyngeal hematoma as well as other potential complications associated with conscious sedation including aspiration, arrhythmia, respiratory failure and death. Alternatives to treatment were discussed, questions were answered. Patient is willing to proceed.   Leanne Lovely, PA-C  03/24/2023 12:59 PM

## 2023-03-25 ENCOUNTER — Encounter: Payer: Self-pay | Admitting: Hematology and Oncology

## 2023-03-25 ENCOUNTER — Inpatient Hospital Stay (HOSPITAL_COMMUNITY): Payer: Self-pay | Admitting: Anesthesiology

## 2023-03-25 ENCOUNTER — Other Ambulatory Visit (HOSPITAL_COMMUNITY): Payer: Self-pay

## 2023-03-25 ENCOUNTER — Encounter (HOSPITAL_COMMUNITY)
Admission: EM | Disposition: A | Discharge status: Home / Self Care | Payer: Self-pay | Source: Home / Self Care | Attending: Internal Medicine

## 2023-03-25 ENCOUNTER — Inpatient Hospital Stay (HOSPITAL_COMMUNITY): Payer: Medicaid Other

## 2023-03-25 ENCOUNTER — Encounter (HOSPITAL_COMMUNITY): Payer: Self-pay | Admitting: Internal Medicine

## 2023-03-25 DIAGNOSIS — B953 Streptococcus pneumoniae as the cause of diseases classified elsewhere: Secondary | ICD-10-CM

## 2023-03-25 DIAGNOSIS — Z853 Personal history of malignant neoplasm of breast: Secondary | ICD-10-CM | POA: Diagnosis not present

## 2023-03-25 DIAGNOSIS — J189 Pneumonia, unspecified organism: Secondary | ICD-10-CM | POA: Diagnosis not present

## 2023-03-25 DIAGNOSIS — A403 Sepsis due to Streptococcus pneumoniae: Secondary | ICD-10-CM | POA: Diagnosis not present

## 2023-03-25 DIAGNOSIS — R7881 Bacteremia: Secondary | ICD-10-CM

## 2023-03-25 DIAGNOSIS — I071 Rheumatic tricuspid insufficiency: Secondary | ICD-10-CM | POA: Diagnosis not present

## 2023-03-25 DIAGNOSIS — F411 Generalized anxiety disorder: Secondary | ICD-10-CM | POA: Diagnosis not present

## 2023-03-25 HISTORY — PX: TRANSESOPHAGEAL ECHOCARDIOGRAM (CATH LAB): EP1270

## 2023-03-25 LAB — CULTURE, BLOOD (ROUTINE X 2)
Culture: NO GROWTH
Culture: NO GROWTH
Special Requests: ADEQUATE

## 2023-03-25 LAB — ECHO TEE

## 2023-03-25 SURGERY — TRANSESOPHAGEAL ECHOCARDIOGRAM (TEE) (CATHLAB)
Anesthesia: Monitor Anesthesia Care

## 2023-03-25 MED ORDER — HEPARIN SOD (PORK) LOCK FLUSH 100 UNIT/ML IV SOLN
500.0000 [IU] | INTRAVENOUS | Status: AC | PRN
  Administered 2023-03-25: 500 [IU]
  Filled 2023-03-25: qty 5

## 2023-03-25 MED ORDER — PROPOFOL 10 MG/ML IV BOLUS
INTRAVENOUS | Status: DC | PRN
  Administered 2023-03-25: 50 mg via INTRAVENOUS
  Administered 2023-03-25: 20 mg via INTRAVENOUS

## 2023-03-25 MED ORDER — AMOXICILLIN 500 MG PO CAPS
1000.0000 mg | ORAL_CAPSULE | Freq: Three times a day (TID) | ORAL | 0 refills | Status: AC
  Filled 2023-03-25: qty 54, 9d supply, fill #0

## 2023-03-25 MED ORDER — ALBUTEROL SULFATE HFA 108 (90 BASE) MCG/ACT IN AERS
2.0000 | INHALATION_SPRAY | Freq: Four times a day (QID) | RESPIRATORY_TRACT | 2 refills | Status: DC | PRN
  Filled 2023-03-25: qty 18, 20d supply, fill #0

## 2023-03-25 MED ORDER — LIDOCAINE 2% (20 MG/ML) 5 ML SYRINGE
INTRAMUSCULAR | Status: DC | PRN
  Administered 2023-03-25: 60 mg via INTRAVENOUS

## 2023-03-25 MED ORDER — LOSARTAN POTASSIUM 25 MG PO TABS
25.0000 mg | ORAL_TABLET | Freq: Every day | ORAL | 0 refills | Status: DC
  Filled 2023-03-25: qty 30, 30d supply, fill #0

## 2023-03-25 MED ORDER — SODIUM CHLORIDE 0.9% FLUSH: 3.0000 mL | INTRAVENOUS | Status: DC | PRN

## 2023-03-25 MED ORDER — POLYETHYLENE GLYCOL 3350 17 GM/SCOOP PO POWD
17.0000 g | Freq: Every day | ORAL | 0 refills | Status: DC
  Filled 2023-03-25: qty 238, 14d supply, fill #0

## 2023-03-25 MED ORDER — CARVEDILOL 3.125 MG PO TABS
3.1250 mg | ORAL_TABLET | Freq: Two times a day (BID) | ORAL | 1 refills | Status: DC
  Filled 2023-03-25: qty 60, 30d supply, fill #0

## 2023-03-25 MED ORDER — SODIUM CHLORIDE 0.9% FLUSH
3.0000 mL | Freq: Two times a day (BID) | INTRAVENOUS | Status: DC
  Administered 2023-03-25: 10 mL via INTRAVENOUS

## 2023-03-25 MED ORDER — FOLIC ACID 1 MG PO TABS
1.0000 mg | ORAL_TABLET | Freq: Every day | ORAL | 0 refills | Status: DC
  Filled 2023-03-25: qty 30, 30d supply, fill #0

## 2023-03-25 MED ORDER — PROPOFOL 500 MG/50ML IV EMUL
INTRAVENOUS | Status: DC | PRN
  Administered 2023-03-25: 150 ug/kg/min via INTRAVENOUS

## 2023-03-25 MED ORDER — GUAIFENESIN ER 600 MG PO TB12
1200.0000 mg | ORAL_TABLET | Freq: Two times a day (BID) | ORAL | 0 refills | Status: DC
  Filled 2023-03-25: qty 20, 5d supply, fill #0

## 2023-03-25 MED ORDER — SODIUM CHLORIDE 0.9 % IV SOLN: INTRAVENOUS | Status: DC | PRN

## 2023-03-25 MED ORDER — AMOXICILLIN 500 MG PO CAPS
1000.0000 mg | ORAL_CAPSULE | Freq: Three times a day (TID) | ORAL | Status: DC
  Filled 2023-03-25 (×2): qty 2

## 2023-03-25 NOTE — Telephone Encounter (Signed)
Made appt. Adv come early for Rads. NFN

## 2023-03-25 NOTE — Progress Notes (Signed)
Subjective:  No new complaints   Antibiotics:  Anti-infectives (From admission, onward)    Start     Dose/Rate Route Frequency Ordered Stop   03/21/23 2200  penicillin G potassium 12 Million Units in dextrose 5 % 500 mL CONTINUOUS infusion        12 Million Units 41.7 mL/hr over 12 Hours Intravenous Every 12 hours 03/21/23 1054     03/20/23 0000  cefTRIAXone (ROCEPHIN) 2 g in sodium chloride 0.9 % 100 mL IVPB  Status:  Discontinued        2 g 200 mL/hr over 30 Minutes Intravenous Every 24 hours 03/19/23 0733 03/21/23 1054   03/19/23 2000  cefTRIAXone (ROCEPHIN) 2 g in sodium chloride 0.9 % 100 mL IVPB  Status:  Discontinued        2 g 200 mL/hr over 30 Minutes Intravenous Every 24 hours 03/19/23 0010 03/19/23 0518   03/19/23 0800  piperacillin-tazobactam (ZOSYN) IVPB 3.375 g  Status:  Discontinued        3.375 g 12.5 mL/hr over 240 Minutes Intravenous Every 8 hours 03/19/23 0534 03/19/23 0733   03/19/23 0600  vancomycin (VANCOCIN) IVPB 1000 mg/200 mL premix  Status:  Discontinued        1,000 mg 200 mL/hr over 60 Minutes Intravenous Every 24 hours 03/19/23 0534 03/19/23 0733   03/19/23 0015  doxycycline (VIBRAMYCIN) 100 mg in sodium chloride 0.9 % 250 mL IVPB  Status:  Discontinued        100 mg 125 mL/hr over 120 Minutes Intravenous Every 12 hours 03/19/23 0010 03/19/23 0518   03/18/23 2345  cefTRIAXone (ROCEPHIN) 2 g in sodium chloride 0.9 % 100 mL IVPB        2 g 200 mL/hr over 30 Minutes Intravenous  Once 03/18/23 2342 03/19/23 0049   03/18/23 2345  azithromycin (ZITHROMAX) 500 mg in sodium chloride 0.9 % 250 mL IVPB        500 mg 250 mL/hr over 60 Minutes Intravenous  Once 03/18/23 2342 03/19/23 0122       Medications: Scheduled Meds:  carvedilol  3.125 mg Oral BID WC   Chlorhexidine Gluconate Cloth  6 each Topical Daily   feeding supplement  237 mL Oral BID BM   folic acid  1 mg Oral Daily   guaiFENesin  1,200 mg Oral BID   losartan  25 mg Oral Daily    ondansetron (ZOFRAN) IV  4 mg Intravenous Once   polyethylene glycol  17 g Oral Daily   senna-docusate  2 tablet Oral BID   sodium chloride flush  3 mL Intravenous Q12H   sodium chloride flush  3-10 mL Intravenous Q12H   Continuous Infusions:  penicillin G potassium 12 Million Units in dextrose 5 % 500 mL CONTINUOUS infusion 41.7 mL/hr at 03/25/23 1513   PRN Meds:.bisacodyl, HYDROcodone-acetaminophen, levalbuterol, LORazepam, melatonin, morphine injection, ondansetron **OR** ondansetron (ZOFRAN) IV, mouth rinse, sodium chloride flush, sodium chloride flush, sodium chloride flush    Objective: Weight change:   Intake/Output Summary (Last 24 hours) at 03/25/2023 1515 Last data filed at 03/25/2023 1513 Gross per 24 hour  Intake 1965.14 ml  Output --  Net 1965.14 ml   Blood pressure 121/85, pulse 90, temperature 98.1 F (36.7 C), temperature source Oral, resp. rate 16, height 5\' 6"  (1.676 m), weight 59.5 kg, last menstrual period 03/17/2023, SpO2 100%. Temp:  [96.7 F (35.9 C)-98.6 F (37 C)] 98.1 F (36.7 C) (01/28 1222) Pulse Rate:  [  81-99] 90 (01/28 1222) Resp:  [16-27] 16 (01/28 1222) BP: (108-158)/(66-110) 121/85 (01/28 1222) SpO2:  [96 %-100 %] 100 % (01/28 1222)  Physical Exam: Physical Exam Constitutional:      General: She is not in acute distress.    Appearance: She is well-developed. She is not diaphoretic.  HENT:     Head: Normocephalic and atraumatic.     Right Ear: External ear normal.     Left Ear: External ear normal.     Mouth/Throat:     Pharynx: No oropharyngeal exudate.  Eyes:     General: No scleral icterus.    Conjunctiva/sclera: Conjunctivae normal.     Pupils: Pupils are equal, round, and reactive to light.  Cardiovascular:     Rate and Rhythm: Normal rate and regular rhythm.     Heart sounds: Normal heart sounds. No murmur heard.    No friction rub. No gallop.  Pulmonary:     Effort: Pulmonary effort is normal. No respiratory distress.      Breath sounds: Normal breath sounds. No wheezing or rales.  Abdominal:     General: Bowel sounds are normal. There is no distension.     Palpations: Abdomen is soft.     Tenderness: There is no abdominal tenderness. There is no rebound.  Musculoskeletal:        General: No tenderness. Normal range of motion.  Lymphadenopathy:     Cervical: No cervical adenopathy.  Skin:    General: Skin is warm and dry.     Coloration: Skin is not pale.     Findings: No erythema or rash.  Neurological:     General: No focal deficit present.     Mental Status: She is alert and oriented to person, place, and time.     Motor: No abnormal muscle tone.     Coordination: Coordination normal.  Psychiatric:        Mood and Affect: Mood normal.        Behavior: Behavior normal.        Thought Content: Thought content normal.        Judgment: Judgment normal.      CBC:    BMET Recent Labs    03/23/23 1220  NA 136  K 4.4  CL 104  CO2 27  GLUCOSE 95  BUN 9  CREATININE 0.60  CALCIUM 8.4*     Liver Panel  No results for input(s): "PROT", "ALBUMIN", "AST", "ALT", "ALKPHOS", "BILITOT", "BILIDIR", "IBILI" in the last 72 hours.     Sedimentation Rate No results for input(s): "ESRSEDRATE" in the last 72 hours. C-Reactive Protein No results for input(s): "CRP" in the last 72 hours.  Micro Results: Recent Results (from the past 720 hours)  Blood Culture (routine x 2)     Status: Abnormal   Collection Time: 03/19/23 12:05 AM   Specimen: BLOOD  Result Value Ref Range Status   Specimen Description   Final    BLOOD SITE NOT SPECIFIED Performed at [facility], [address] [place], [place] [address]    Special Requests   Final    BOTTLES DRAWN AEROBIC AND ANAEROBIC Blood Culture adequate volume Performed at [facility], [address] [place], [place] [address]    Culture  Setup Time   Final    GRAM POSITIVE COCCI IN CHAINS IN BOTH AEROBIC AND  ANAEROBIC BOTTLES CRITICAL RESULT CALLED TO, READ BACK BY AND VERIFIED WITH: PHARMD MELISSA JAMES ON 03/19/23 @ 1601  BY DRT Performed at [facility], [address], [place], [place] [address]    Culture STREPTOCOCCUS PNEUMONIAE (A)  Final   Report Status 03/21/2023 FINAL  Final   Organism ID, Bacteria STREPTOCOCCUS PNEUMONIAE  Final      Susceptibility   Streptococcus pneumoniae - MIC*    ERYTHROMYCIN <=0.12 SENSITIVE Sensitive     LEVOFLOXACIN 0.5 SENSITIVE Sensitive     VANCOMYCIN 0.5 SENSITIVE Sensitive     PENICILLIN (meningitis) <=0.06 SENSITIVE Sensitive     PENO - penicillin <=0.06      PENICILLIN (non-meningitis) <=0.06 SENSITIVE Sensitive     PENICILLIN (oral) <=0.06 SENSITIVE Sensitive     CEFTRIAXONE (non-meningitis) <=0.12 SENSITIVE Sensitive     CEFTRIAXONE (meningitis) <=0.12 SENSITIVE Sensitive     * STREPTOCOCCUS PNEUMONIAE  Blood Culture ID Panel (Reflexed)     Status: Abnormal   Collection Time: 03/19/23 12:05 AM  Result Value Ref Range Status   Enterococcus faecalis NOT DETECTED NOT DETECTED Final   Enterococcus Faecium NOT DETECTED NOT DETECTED Final   Listeria monocytogenes NOT DETECTED NOT DETECTED Final   Staphylococcus species NOT DETECTED NOT DETECTED Final   Staphylococcus aureus (BCID) NOT DETECTED NOT DETECTED Final   Staphylococcus epidermidis NOT DETECTED NOT DETECTED Final   Staphylococcus lugdunensis NOT DETECTED NOT DETECTED Final   Streptococcus species DETECTED (A) NOT DETECTED Final    Comment: CRITICAL RESULT CALLED TO, READ BACK BY AND VERIFIED WITH: PHARMD MELISSA JAMES [id number] AT 1601, DRT    Streptococcus agalactiae NOT DETECTED NOT DETECTED Final   Streptococcus pneumoniae DETECTED (A) NOT DETECTED Final    Comment: CRITICAL RESULT CALLED TO, READ BACK BY AND VERIFIED WITH: PHARMD MELISSA JAMES [id number] AT 1601, DRT    Streptococcus pyogenes NOT DETECTED NOT DETECTED Final   A.calcoaceticus-baumannii NOT DETECTED NOT DETECTED Final    Bacteroides fragilis NOT DETECTED NOT DETECTED Final   Enterobacterales NOT DETECTED NOT DETECTED Final   Enterobacter cloacae complex NOT DETECTED NOT DETECTED Final   Escherichia coli NOT DETECTED NOT DETECTED Final   Klebsiella aerogenes NOT DETECTED NOT DETECTED Final   Klebsiella oxytoca NOT DETECTED NOT DETECTED Final   Klebsiella pneumoniae NOT DETECTED NOT DETECTED Final   Proteus species NOT DETECTED NOT DETECTED Final   Salmonella species NOT DETECTED NOT DETECTED Final   Serratia marcescens NOT DETECTED NOT DETECTED Final   Haemophilus influenzae NOT DETECTED NOT DETECTED Final   Neisseria meningitidis NOT DETECTED NOT DETECTED Final   Pseudomonas aeruginosa NOT DETECTED NOT DETECTED Final   Stenotrophomonas maltophilia NOT DETECTED NOT DETECTED Final   Candida albicans NOT DETECTED NOT DETECTED Final   Candida auris NOT DETECTED NOT DETECTED Final   Candida glabrata NOT DETECTED NOT DETECTED Final   Candida krusei NOT DETECTED NOT DETECTED Final   Candida parapsilosis NOT DETECTED NOT DETECTED Final   Candida tropicalis NOT DETECTED NOT DETECTED Final   Cryptococcus neoformans/gattii NOT DETECTED NOT DETECTED Final    Comment: Performed at [facility], [address], [place], [place] [address]  Blood Culture (routine x 2)     Status: Abnormal   Collection Time: 03/19/23 12:27 AM   Specimen: BLOOD RIGHT FOREARM  Result Value Ref Range Status   Specimen Description   Final    BLOOD RIGHT FOREARM Performed at [facility], [address], [place], [place] [address]    Special Requests   Final    BOTTLES DRAWN AEROBIC AND ANAEROBIC Blood Culture results may not be  optimal due to an inadequate volume of blood received in culture bottles Performed at [facility], [address] [place], [place] [address]    Culture  Setup Time   Final    GRAM POSITIVE COCCI IN CHAINS IN BOTH AEROBIC AND ANAEROBIC BOTTLES    Culture (A)  Final     STREPTOCOCCUS PNEUMONIAE SUSCEPTIBILITIES PERFORMED ON PREVIOUS CULTURE WITHIN THE LAST 5 DAYS. Performed at [facility], [address] [place], [place] [address]    Report Status 03/21/2023 FINAL  Final  Respiratory (~20 pathogens) panel by PCR     Status: None   Collection Time: 03/19/23  1:05 AM   Specimen: Nasopharyngeal Swab; Respiratory  Result Value Ref Range Status   Adenovirus NOT DETECTED NOT DETECTED Final   Coronavirus 229E NOT DETECTED NOT DETECTED Final    Comment: (NOTE) The Coronavirus on the Respiratory Panel, DOES NOT test for the novel  Coronavirus (2019 nCoV)    Coronavirus HKU1 NOT DETECTED NOT DETECTED Final   Coronavirus NL63 NOT DETECTED NOT DETECTED Final   Coronavirus OC43 NOT DETECTED NOT DETECTED Final   Metapneumovirus NOT DETECTED NOT DETECTED Final   Rhinovirus / Enterovirus NOT DETECTED NOT DETECTED Final   Influenza A NOT DETECTED NOT DETECTED Final   Influenza B NOT DETECTED NOT DETECTED Final   Parainfluenza Virus 1 NOT DETECTED NOT DETECTED Final   Parainfluenza Virus 2 NOT DETECTED NOT DETECTED Final   Parainfluenza Virus 3 NOT DETECTED NOT DETECTED Final   Parainfluenza Virus 4 NOT DETECTED NOT DETECTED Final   Respiratory Syncytial Virus NOT DETECTED NOT DETECTED Final   Bordetella pertussis NOT DETECTED NOT DETECTED Final   Bordetella Parapertussis NOT DETECTED NOT DETECTED Final   Chlamydophila pneumoniae NOT DETECTED NOT DETECTED Final   Mycoplasma pneumoniae NOT DETECTED NOT DETECTED Final    Comment: Performed at [facility], [address] [place], [place] [address]  Culture, blood (Routine X 2) w Reflex to ID Panel     Status: None   Collection Time: 03/20/23  4:09 PM   Specimen: BLOOD  Result Value Ref Range Status   Specimen Description   Final    BLOOD SITE NOT SPECIFIED Performed at [facility], [address], [place], [place] [address]    Special Requests   Final    BOTTLES DRAWN AEROBIC  ONLY Blood Culture results may not be optimal due to an inadequate volume of blood received in culture bottles Performed at [facility], [address] [place], [place] [address]    Culture   Final    NO GROWTH 5 DAYS Performed at [facility], [address], [place], [place] [address]    Report Status 03/25/2023 FINAL  Final  Culture, blood (Routine X 2) w Reflex to ID Panel     Status: None   Collection Time: 03/20/23  4:18 PM   Specimen: BLOOD LEFT ARM  Result Value Ref Range Status   Specimen Description   Final    BLOOD LEFT ARM Performed at [facility], [address], [place], [place] [address]    Special Requests   Final    BOTTLES DRAWN AEROBIC AND ANAEROBIC Blood Culture adequate volume Performed at [facility], [address] [place], [place] [address]    Culture   Final    NO GROWTH 5 DAYS Performed at [facility], [address] [place], [place] [address]  Report Status 03/25/2023 FINAL  Final    Studies/Results: ECHO TEE Result Date: 03/25/2023    TRANSESOPHOGEAL ECHO REPORT   Patient Name:   CATHERIN DOORN Date of Exam: 03/25/2023 Medical Rec #:  [id number]             Height:       66.0 in Accession #:    [id number]            Weight:       131.1 lb Date of Birth:  03/24/1985             BSA:          1.671 m Patient Age:    37 years              BP:           153/91 mmHg Patient Gender: F                     HR:           81 bpm. Exam Location:  Inpatient Procedure: Transesophageal Echo, Color Doppler and Cardiac Doppler Indications:     Bacteremia  History:         Patient has prior history of Echocardiogram examinations, most                  recent 03/21/2023.  Sonographer:     Irving Burton Senior RDCS Referring Phys:  [id number] Corrin Parker Diagnosing Phys: Chilton Si MD PROCEDURE: After discussion of the risks and benefits of a TEE, an informed consent was obtained from the patient. The  transesophogeal probe was passed without difficulty through the esophogus of the patient. Sedation performed by different physician. The patient was monitored while under deep sedation. Anesthestetic sedation was provided intravenously by Anesthesiology: 132mg  of Propofol, 60mg  of Lidocaine. The patient's vital signs; including heart rate, blood pressure, and oxygen saturation; remained stable throughout the procedure. The patient developed no complications during the procedure.  IMPRESSIONS  1. Left ventricular ejection fraction, by estimation, is 45 to 50%. The left ventricle has mildly decreased function. The left ventricle has no regional wall motion abnormalities.  2. Right ventricular systolic function is normal. The right ventricular size is normal.  3. No left atrial/left atrial appendage thrombus was detected. The LAA emptying velocity was 106 cm/s.  4. The mitral valve is normal in structure. Trivial mitral valve regurgitation. No evidence of mitral stenosis.  5. The aortic valve is tricuspid. Aortic valve regurgitation is not visualized. No aortic stenosis is present.  6. The inferior vena cava is normal in size with greater than 50% respiratory variability, suggesting right atrial pressure of 3 mmHg. Conclusion(s)/Recommendation(s): No evidence of vegetation/infective endocarditis on this transesophageael echocardiogram. FINDINGS  Left Ventricle: Left ventricular ejection fraction, by estimation, is 45 to 50%. The left ventricle has mildly decreased function. The left ventricle has no regional wall motion abnormalities. The left ventricular internal cavity size was normal in size. There is no left ventricular hypertrophy. Right Ventricle: The right ventricular size is normal. No increase in right ventricular wall thickness. Right ventricular systolic function is normal. Left Atrium: Left atrial size was normal in size. No left atrial/left atrial appendage thrombus was detected. The LAA emptying velocity  was 106 cm/s. Right Atrium: Right atrial size was normal in size. Pericardium: There is no evidence of pericardial effusion. Mitral Valve: The mitral valve is normal in structure. Trivial mitral valve regurgitation. No evidence of mitral  valve stenosis. Tricuspid Valve: The tricuspid valve is normal in structure. Tricuspid valve regurgitation is trivial. No evidence of tricuspid stenosis. Aortic Valve: The aortic valve is tricuspid. Aortic valve regurgitation is not visualized. No aortic stenosis is present. Pulmonic Valve: The pulmonic valve was normal in structure. Pulmonic valve regurgitation is not visualized. No evidence of pulmonic stenosis. Aorta: The aortic root is normal in size and structure. Venous: The inferior vena cava is normal in size with greater than 50% respiratory variability, suggesting right atrial pressure of 3 mmHg. IAS/Shunts: No atrial level shunt detected by color flow Doppler. Additional Comments: Spectral Doppler performed. TRICUSPID VALVE TR Peak grad:   17.1 mmHg TR Vmax:        207.00 cm/s Chilton Si MD Electronically signed by Chilton Si MD Signature Date/Time: 03/25/2023/11:43:23 AM    Final    EP STUDY Result Date: 03/25/2023 See surgical note for result.     Assessment/Plan:  INTERVAL HISTORY: TEE clean   Principal Problem:   Pneumococcal bacteremia Active Problems:   Sepsis due to pneumonia (HCC)   AKI (acute kidney injury) (HCC)   History of breast cancer   GAD (generalized anxiety disorder)   CAP (community acquired pneumonia)    Connie Wells is a 38 y.o. female with metastatic breast cancer on chemotherapy via port, admitted with pneumococcal bacteremia, sepsis and pneumonia. Her TTE shows TVR which appears new  #1 Pneumococcal bacteremia from pneumonia  TEE negative  Given no endocarditis would need protracted IV therapy  Similar report would not to be removed.  We will switch her over to high-dose amoxicillin to 500 mg  tablets for 1000 mg to be taken 3 times daily to complete 2 weeks of course of antibiotics from date of clearance of her blood cultures.  Will then plan on seeing her in our clinic where we will draw surveillance blood cultures from peripheral sites  KENETHA COZZA has an appointment on 04/16/2023  at 4PM  with Dr. Daiva Eves at  [facility], which  is located in the [facility] at  [address] in [place].  [address], which is located to the left of the elevators.  Phone: [phone]  Fax: [phone]  [email or web address]  The patient should arrive 30 minutes prior to their appoitment.  I have personally spent 50 minutes involved in face-to-face and non-face-to-face activities for this patient on the day of the visit. Professional time spent includes the following activities: Preparing to see the patient (review of tests), Obtaining and/or reviewing separately obtained history (admission/discharge record), Performing a medically appropriate examination and/or evaluation , Ordering medications/tests/procedures, referring and communicating with other health care professionals, Documenting clinical information in the EMR, Independently interpreting results (not separately reported), Communicating results to the patient/family/caregiver, Counseling and educating the patient/family/caregiver and Care coordination (not separately reported).   Evaluation of the patient requires complex antimicrobial therapy evaluation, counseling , isolation needs to reduce disease transmission and risk assessment and mitigation.   I will sign off for now.  Please call with further questions.   LOS: 6 days   Acey Lav 03/25/2023, 3:15 PM

## 2023-03-25 NOTE — CV Procedure (Signed)
Brief TEE Note  LVEF 50% No LA/LAA thrombus or masses Trivial TR.  Trivial PR. No evidence of endocarditis.   For additional details see full report.   Connie Wells C. Duke Salvia, MD, [facility] 03/25/2023 10:48 AM

## 2023-03-25 NOTE — Anesthesia Postprocedure Evaluation (Signed)
Anesthesia Post Note  Patient: Connie Wells  Procedure(s) Performed: TRANSESOPHAGEAL ECHOCARDIOGRAM     Patient location during evaluation: PACU Anesthesia Type: MAC Level of consciousness: awake and alert Pain management: pain level controlled Vital Signs Assessment: post-procedure vital signs reviewed and stable Respiratory status: spontaneous breathing, nonlabored ventilation, respiratory function stable and patient connected to nasal cannula oxygen Cardiovascular status: stable and blood pressure returned to baseline Postop Assessment: no apparent nausea or vomiting Anesthetic complications: no  No notable events documented.  Last Vitals:  Vitals:   03/25/23 1120 03/25/23 1222  BP: (!) 158/100 121/85  Pulse: 87 90  Resp: (!) 21 16  Temp:  36.7 C  SpO2: 97% 100%    Last Pain:  Vitals:   03/25/23 1222  TempSrc: Oral  PainSc:                  Jaquavian Firkus

## 2023-03-25 NOTE — Interval H&P Note (Signed)
History and Physical Interval Note:  03/25/2023 8:42 AM  Connie Wells  has presented today for surgery, with the diagnosis of Bacteremia.  The various methods of treatment have been discussed with the patient and family. After consideration of risks, benefits and other options for treatment, the patient has consented to  Procedure(s): TRANSESOPHAGEAL ECHOCARDIOGRAM (N/A) as a surgical intervention.  The patient's history has been reviewed, patient examined, no change in status, stable for surgery.  I have reviewed the patient's chart and labs.  Questions were answered to the patient's satisfaction.     Chilton Si, MD

## 2023-03-25 NOTE — Discharge Summary (Signed)
Physician Discharge Summary   Patient: Connie Wells MRN: [id number] DOB: 02-Jan-1986  Admit date:     03/18/2023  Discharge date: 03/25/23  Discharge Physician: Alba Cory   PCP: Default, Provider, MD   Recommendations at discharge:   Needs follow up with ID in 2 weeks to repeat Blood culture.  Needs CT chest in 4 weeks.    Discharge Diagnoses: Principal Problem:   Pneumococcal bacteremia Active Problems:   Sepsis due to pneumonia (HCC)   History of breast cancer   AKI (acute kidney injury) (HCC)   GAD (generalized anxiety disorder)   CAP (community acquired pneumonia)  Resolved Problems:   * No resolved hospital problems. *  Hospital Course: 38 year old with past medical history significant for breast cancer on Herceptin maintenance therapy, pulmonary nodule, liver lesion, generalized anxiety disorder present complaining of right side rib pain, intermittent associated with shortness of breath.  Evaluation in the ED she was found to be febrile temperature 102, chest x-ray dense dense consolidation within the right lung base.  CT chest new dense consolidation of margin of the right lower lobe with some sparing in the superior segment but multiple segmental involvement.  Appearance favor bacterial pneumonia pattern although pulmonary hemorrhage can have similar appearance.    Assessment and Plan: 1-Sepsis secondary to Strep Pneumoniae Pneumonia -Patient presented with fever temperature 102, tachycardiac heart rate 126, tachypnea respiration rate 33, hypotension, source of infection pneumonia.  CT chest with finding consistent with consolidation of the right lower lobe with some spurring in the superior segment. -Strep pneumonia antigen positive -Blood cultures x 2 Strep Pneumoniae.  -Continue with guaifenesin as needed nebulizers -Denies hemoptysis -Monitor WBC, increased today. Afebrile. Continue with current antibiotics.  Repeated Blood culture 1/23. No growth to  date.   ECHO, no vegetation, Ef 45--50 % Ceftriaxone change to Penicillin by ID Pulmonologist consulted. Recommend repeat ct chest in 4 weeks.  Awaiting final disposition per ID TEE negative for Endocarditis. Discharge on Amoxicillin to complete 2 weeks.    Strep Pneumoniae bacteremia:  -Continue with IV ceftriaxone.  -ID consulted. TEE b=negative for endocarditis.  Discharge on Amoxicillin TID high dose to complete 2 weeks from negative blood culture. Last dose 2/06   New Cardiomyopathy EF 45--50% HTN Suspect related to acute illness. Also needs close follow up with her cardiologist. Melina Fiddler on chemo Started on Cozaar and carvedilol. BP better controlled.  Needs follow up with cardiologist    Anemia;  Of malignancy and folic deficiency.  Started folic acid Monitor hb improved to 9   Right-sided chest wall pain secondary to pneumonia CT chest:New dense consolidation of much of the right lower lobe with some sparing in the superior segment but multi segmental involvement otherwise. Minimal spillover ground-glass opacity into the right middle lobe. Appearance favors bacterial pneumonia pattern although pulmonary hemorrhage can have a similar appearance.  -IV penicillin. =--Discharge on Amoxicillin until 2/06   AKI: -Creatinine baseline 0.9 -Creatinine peaked to 1.2.   Resolved. Monitor on Cozaar.    Generalized anxiety disorder As needed Ativan   Metastatic breast cancer: Patient has breast cancer with metastasis to multiple side currently on maintenance therapy with Herceptin and Perjeta. -Dr. Pamelia Hoit  follow up while in the hospital.    Hypokalemia: Replaced.  Hypomagnesemia; Replaced.        Estimated body mass index is 21.16 kg/m as calculated from the following:   Height as of this encounter: 5\' 6"  (1.676 m).   Weight as of this encounter: 59.5  kg.            Consultants: ID, Pulmonologist  Procedures performed: TEE Disposition: Home Diet recommendation:   Discharge Diet Orders (From admission, onward)     Start     Ordered   03/25/23 0000  Diet - low sodium heart healthy        03/25/23 1528           Cardiac diet DISCHARGE MEDICATION: Allergies as of 03/25/2023   No Known Allergies      Medication List     STOP taking these medications    naproxen sodium 220 MG tablet Commonly known as: ALEVE       TAKE these medications    albuterol 108 (90 Base) MCG/ACT inhaler Commonly known as: VENTOLIN HFA Inhale 2 puffs into the lungs every 6 (six) hours as needed for wheezing or shortness of breath.   amoxicillin 500 MG capsule Commonly known as: AMOXIL Take 2 capsules (1,000 mg total) by mouth every 8 (eight) hours for 9 days.   carvedilol 3.125 MG tablet Commonly known as: COREG Take 1 tablet (3.125 mg total) by mouth 2 (two) times daily with a meal.   folic acid 1 MG tablet Commonly known as: FOLVITE Take 1 tablet (1 mg total) by mouth daily. Start taking on: March 26, 2023   guaiFENesin 600 MG 12 hr tablet Commonly known as: MUCINEX Take 2 tablets (1,200 mg total) by mouth 2 (two) times daily.   ibuprofen 200 MG tablet Commonly known as: ADVIL Take 200 mg by mouth every 6 (six) hours as needed.   lidocaine-prilocaine cream Commonly known as: EMLA Apply to affected area once   losartan 25 MG tablet Commonly known as: COZAAR Take 1 tablet (25 mg total) by mouth daily. Start taking on: March 26, 2023   polyethylene glycol 17 g packet Commonly known as: MIRALAX / GLYCOLAX Take 17 g by mouth daily. Start taking on: March 26, 2023        Follow-up Information     [facility] [place]. Schedule an appointment as soon as possible for a visit in 6 week(s).   Specialty: Pulmonology Why: to follow up on chest xray and pneumonia Contact information: [address] [place] [address] [phone]               Discharge Exam: Ceasar Mons  Weights   03/18/23 1757 03/19/23 0228 03/20/23 0407  Weight: 56 kg 56 kg 59.5 kg   General; NAD  Condition at discharge: stable  The results of significant diagnostics from this hospitalization (including imaging, microbiology, ancillary and laboratory) are listed below for reference.   Imaging Studies: ECHO TEE Result Date: 03/25/2023    TRANSESOPHOGEAL ECHO REPORT   Patient Name:   Connie Wells Date of Exam: 03/25/2023 Medical Rec #:  [id number]             Height:       66.0 in Accession #:    [id number]            Weight:       131.1 lb Date of Birth:  Mar 11, 1985             BSA:          1.671 m Patient Age:    37 years              BP:           153/91  mmHg Patient Gender: F                     HR:           81 bpm. Exam Location:  Inpatient Procedure: Transesophageal Echo, Color Doppler and Cardiac Doppler Indications:     Bacteremia  History:         Patient has prior history of Echocardiogram examinations, most                  recent 03/21/2023.  Sonographer:     Irving Burton Senior RDCS Referring Phys:  [id number] Corrin Parker Diagnosing Phys: Chilton Si MD PROCEDURE: After discussion of the risks and benefits of a TEE, an informed consent was obtained from the patient. The transesophogeal probe was passed without difficulty through the esophogus of the patient. Sedation performed by different physician. The patient was monitored while under deep sedation. Anesthestetic sedation was provided intravenously by Anesthesiology: 132mg  of Propofol, 60mg  of Lidocaine. The patient's vital signs; including heart rate, blood pressure, and oxygen saturation; remained stable throughout the procedure. The patient developed no complications during the procedure.  IMPRESSIONS  1. Left ventricular ejection fraction, by estimation, is 45 to 50%. The left ventricle has mildly decreased function. The left ventricle has no regional wall motion abnormalities.  2. Right ventricular systolic function is  normal. The right ventricular size is normal.  3. No left atrial/left atrial appendage thrombus was detected. The LAA emptying velocity was 106 cm/s.  4. The mitral valve is normal in structure. Trivial mitral valve regurgitation. No evidence of mitral stenosis.  5. The aortic valve is tricuspid. Aortic valve regurgitation is not visualized. No aortic stenosis is present.  6. The inferior vena cava is normal in size with greater than 50% respiratory variability, suggesting right atrial pressure of 3 mmHg. Conclusion(s)/Recommendation(s): No evidence of vegetation/infective endocarditis on this transesophageael echocardiogram. FINDINGS  Left Ventricle: Left ventricular ejection fraction, by estimation, is 45 to 50%. The left ventricle has mildly decreased function. The left ventricle has no regional wall motion abnormalities. The left ventricular internal cavity size was normal in size. There is no left ventricular hypertrophy. Right Ventricle: The right ventricular size is normal. No increase in right ventricular wall thickness. Right ventricular systolic function is normal. Left Atrium: Left atrial size was normal in size. No left atrial/left atrial appendage thrombus was detected. The LAA emptying velocity was 106 cm/s. Right Atrium: Right atrial size was normal in size. Pericardium: There is no evidence of pericardial effusion. Mitral Valve: The mitral valve is normal in structure. Trivial mitral valve regurgitation. No evidence of mitral valve stenosis. Tricuspid Valve: The tricuspid valve is normal in structure. Tricuspid valve regurgitation is trivial. No evidence of tricuspid stenosis. Aortic Valve: The aortic valve is tricuspid. Aortic valve regurgitation is not visualized. No aortic stenosis is present. Pulmonic Valve: The pulmonic valve was normal in structure. Pulmonic valve regurgitation is not visualized. No evidence of pulmonic stenosis. Aorta: The aortic root is normal in size and structure. Venous:  The inferior vena cava is normal in size with greater than 50% respiratory variability, suggesting right atrial pressure of 3 mmHg. IAS/Shunts: No atrial level shunt detected by color flow Doppler. Additional Comments: Spectral Doppler performed. TRICUSPID VALVE TR Peak grad:   17.1 mmHg TR Vmax:        207.00 cm/s Chilton Si MD Electronically signed by Chilton Si MD Signature Date/Time: 03/25/2023/11:43:23 AM    Final    EP  STUDY Result Date: 03/25/2023 See surgical note for result.  ECHOCARDIOGRAM COMPLETE Result Date: 03/21/2023    ECHOCARDIOGRAM REPORT   Patient Name:   TORII ROYSE Date of Exam: 03/21/2023 Medical Rec #:  [id number]             Height:       66.0 in Accession #:    [id number]            Weight:       131.1 lb Date of Birth:  [birth year]             BSA:          1.671 m Patient Age:    37 years              BP:           130/86 mmHg Patient Gender: F                     HR:           100 bpm. Exam Location:  Inpatient Procedure: 2D Echo, Cardiac Doppler, Color Doppler and Strain Analysis Indications:    Endocarditis.  History:        Patient has prior history of Echocardiogram examinations, most                 recent 07/03/2022. Signs/Symptoms:Bacteremia and Chest Pain; Risk                 Factors:Current Smoker. Metastatic breast cancer.  Sonographer:    Sheralyn Boatman RDCS Referring Phys: [id number] [facility] IMPRESSIONS  1. Left ventricular ejection fraction, by estimation, is 45 to 50%. The left ventricle has mildly decreased function. The left ventricle has no regional wall motion abnormalities. Left ventricular diastolic parameters were normal.  2. Right ventricular systolic function is normal. The right ventricular size is normal. There is mildly elevated pulmonary artery systolic pressure.  3. The mitral valve is normal in structure. Mild to moderate mitral valve regurgitation. No evidence of mitral stenosis.  4. Tricuspid valve regurgitation is moderate to severe.   5. The aortic valve is tricuspid. Aortic valve regurgitation is not visualized. No aortic stenosis is present.  6. The inferior vena cava is dilated in size with >50% respiratory variability, suggesting right atrial pressure of 8 mmHg. FINDINGS  Left Ventricle: Left ventricular ejection fraction, by estimation, is 45 to 50%. The left ventricle has mildly decreased function. The left ventricle has no regional wall motion abnormalities. The left ventricular internal cavity size was normal in size. There is no left ventricular hypertrophy. Left ventricular diastolic parameters were normal. Normal left ventricular filling pressure. Right Ventricle: The right ventricular size is normal. No increase in right ventricular wall thickness. Right ventricular systolic function is normal. There is mildly elevated pulmonary artery systolic pressure. The tricuspid regurgitant velocity is 2.65  m/s, and with an assumed right atrial pressure of 8 mmHg, the estimated right ventricular systolic pressure is 36.1 mmHg. Left Atrium: Left atrial size was normal in size. Right Atrium: Right atrial size was normal in size. Pericardium: There is no evidence of pericardial effusion. Mitral Valve: The mitral valve is normal in structure. Mild to moderate mitral valve regurgitation. No evidence of mitral valve stenosis. Tricuspid Valve: The tricuspid valve is normal in structure. Tricuspid valve regurgitation is moderate to severe. No evidence of tricuspid stenosis. Aortic Valve: The aortic valve is tricuspid. Aortic valve regurgitation is not visualized. No aortic stenosis is  present. Pulmonic Valve: The pulmonic valve was normal in structure. Pulmonic valve regurgitation is not visualized. No evidence of pulmonic stenosis. Aorta: The aortic root is normal in size and structure. Venous: The inferior vena cava is dilated in size with greater than 50% respiratory variability, suggesting right atrial pressure of 8 mmHg. IAS/Shunts: No atrial  level shunt detected by color flow Doppler.  LEFT VENTRICLE PLAX 2D LVIDd:         4.40 cm     Diastology LVIDs:         3.40 cm     LV e' medial:    12.60 cm/s LV PW:         1.20 cm     LV E/e' medial:  8.4 LV IVS:        1.00 cm     LV e' lateral:   14.80 cm/s LVOT diam:     2.00 cm     LV E/e' lateral: 7.2 LV SV:         62 LV SV Index:   37 LVOT Area:     3.14 cm  LV Volumes (MOD) LV vol d, MOD A2C: 98.9 ml LV vol d, MOD A4C: 90.5 ml LV vol s, MOD A2C: 57.1 ml LV vol s, MOD A4C: 49.7 ml LV SV MOD A2C:     41.8 ml LV SV MOD A4C:     90.5 ml LV SV MOD BP:      42.3 ml RIGHT VENTRICLE             IVC RV S prime:     13.10 cm/s  IVC diam: 2.50 cm TAPSE (M-mode): 1.6 cm LEFT ATRIUM             Index        RIGHT ATRIUM           Index LA diam:        3.50 cm 2.09 cm/m   RA Area:     11.30 cm LA Vol (A2C):   44.7 ml 26.75 ml/m  RA Volume:   29.40 ml  17.59 ml/m LA Vol (A4C):   35.7 ml 21.36 ml/m LA Biplane Vol: 39.8 ml 23.82 ml/m  AORTIC VALVE LVOT Vmax:   107.00 cm/s LVOT Vmean:  74.100 cm/s LVOT VTI:    0.198 m  AORTA Ao Root diam: 2.90 cm Ao Asc diam:  3.10 cm MITRAL VALVE                TRICUSPID VALVE MV Area (PHT): 5.13 cm     TR Peak grad:   28.1 mmHg MV Decel Time: 148 msec     TR Vmax:        265.00 cm/s MV E velocity: 106.00 cm/s                             SHUNTS                             Systemic VTI:  0.20 m                             Systemic Diam: 2.00 cm Chilton Si MD Electronically signed by Chilton Si MD Signature Date/Time: 03/21/2023/5:15:30 PM    Final    DG CHEST PORT 1 VIEW Result Date: 03/21/2023 CLINICAL DATA:  Cough and shortness of breath. Pneumonia. Breast cancer. EXAM: PORTABLE CHEST 1 VIEW COMPARISON:  03/18/2023 FINDINGS: The heart size and mediastinal contours are within normal limits. Left-sided Port-A-Cath remains in appropriate position. Right lower lobe airspace disease shows mild improvement since previous study. Small right pleural effusion again seen.  IMPRESSION: Mild improvement in right lower lobe airspace disease. Small right pleural effusion. Electronically Signed   By: Danae Orleans M.D.   On: 03/21/2023 12:09   DG Chest Port 1 View Result Date: 03/18/2023 CLINICAL DATA:  Pneumonia EXAM: PORTABLE CHEST 1 VIEW COMPARISON:  None Available. FINDINGS: There is dense consolidation within the right lung base, in keeping changes of acute lobar pneumonia in the appropriate clinical setting. Small right pleural effusion may be present. No pneumothorax. No pleural effusion on the left. Left lung is clear. Cardiac size within normal limits. Left internal jugular port tip seen within the superior vena cava. Pulmonary vascularity is normal. Suspected right hilar adenopathy. No acute bone abnormality. IMPRESSION: 1. Dense consolidation within the right lung base, in keeping changes of acute lobar pneumonia in the appropriate clinical setting. Radiographic follow-up to resolution is recommended. 2. Suspected right hilar adenopathy. Electronically Signed   By: Helyn Numbers M.D.   On: 03/18/2023 22:09   CT CHEST ABDOMEN PELVIS W CONTRAST Result Date: 03/18/2023 CLINICAL DATA:  Breast cancer restaging.  Right chest pain. * Tracking Code: BO * EXAM: CT CHEST, ABDOMEN, AND PELVIS WITH CONTRAST TECHNIQUE: Multidetector CT imaging of the chest, abdomen and pelvis was performed following the standard protocol during bolus administration of intravenous contrast. RADIATION DOSE REDUCTION: This exam was performed according to the departmental dose-optimization program which includes automated exposure control, adjustment of the mA and/or kV according to patient size and/or use of iterative reconstruction technique. CONTRAST:  OMNIPAQUE IOHEXOL 300 MG/ML  SOLN COMPARISON:  11/27/2022 FINDINGS: CT CHEST FINDINGS Cardiovascular: Left Port-A-Cath tip: SVC. Mediastinum/Nodes: Clustered right axillary lymph nodes including an index node measuring 1.1 cm in short axis on image  11 series 2, improved from prior exam where this measured 1.7 cm. Small left axillary and bilateral subpectoral lymph nodes are present. Subglandular lesion in the right breast potentially with associated calcification measuring 2.1 by 1.2 cm on image 33 series 2, formerly 2.8 by 1.3 cm. The more medial solid breast nodule measures about 0.9 cm in diameter on image 37 series 2, previously 2.0 by 2.0 cm. This represents a substantial improvement. Lungs/Pleura: New dense consolidation of much of the right lower lobe with some sparing in the superior segment but multi segmental involvement otherwise. Minimal spillover ground-glass opacity into the right middle lobe for example on image 83 series 4. Appearance favors bacterial pneumonia pattern although pulmonary hemorrhage can have a similar appearance. Musculoskeletal: Unremarkable CT ABDOMEN PELVIS FINDINGS Hepatobiliary: Unremarkable Pancreas: Unremarkable Spleen: Unremarkable Adrenals/Urinary Tract: Stable scarring in the left kidney upper pole. Absent right kidney with compensatory hypertrophy of the left kidney. Left adrenal mass 1.7 by 2.0 cm on image 54 series 2, nonspecific but not hypermetabolic on 06/19/2022 hence most likely benign. Stomach/Bowel: Unremarkable Vascular/Lymphatic: Unremarkable Reproductive: Retroverted uterus.  Otherwise unremarkable. Other: Mild density at the umbilicus is likely incidental, correlate with any tenderness along the umbilical region in assessing for omphalitis. Musculoskeletal: Unremarkable IMPRESSION: 1. New dense consolidation of much of the right lower lobe with some sparing in the superior segment but multi segmental involvement otherwise. Minimal spillover ground-glass opacity into the right middle lobe. Appearance favors bacterial pneumonia pattern although pulmonary hemorrhage  can have a similar appearance. We are attempting notification of the referring call service, and given the after hours timing, substantial right  chest pain, and findings in the right lower lobe, technologist personnel are directing the patient to the emergency department. 2. Substantial improvement in the right breast lesions and right axillary adenopathy. 3. Mild density at the umbilicus is likely incidental, correlate with any tenderness along the umbilical region in assessing for omphalitis. 4. Absent right kidney with compensatory hypertrophy of the left kidney. 5. Stable scarring in the left kidney upper pole. 6. Left adrenal mass 1.7 by 2.0 cm, nonspecific but not hypermetabolic on 06/19/2022 hence most likely benign. Electronically Signed   By: Gaylyn Rong M.D.   On: 03/18/2023 17:17    Microbiology: Results for orders placed or performed during the hospital encounter of 03/18/23  Blood Culture (routine x 2)     Status: Abnormal   Collection Time: 03/19/23 12:05 AM   Specimen: BLOOD  Result Value Ref Range Status   Specimen Description   Final    BLOOD SITE NOT SPECIFIED Performed at [facility], [address], [place], [place] [address]    Special Requests   Final    BOTTLES DRAWN AEROBIC AND ANAEROBIC Blood Culture adequate volume Performed at [facility], [address] [place], [place] [address]    Culture  Setup Time   Final    GRAM POSITIVE COCCI IN CHAINS IN BOTH AEROBIC AND ANAEROBIC BOTTLES CRITICAL RESULT CALLED TO, READ BACK BY AND VERIFIED WITH: PHARMD MELISSA JAMES ON 03/19/23 @ 1601 BY DRT Performed at [facility], [address] [place], [place] [address]    Culture STREPTOCOCCUS PNEUMONIAE (A)  Final   Report Status 03/21/2023 FINAL  Final   Organism ID, Bacteria STREPTOCOCCUS PNEUMONIAE  Final      Susceptibility   Streptococcus pneumoniae - MIC*    ERYTHROMYCIN <=0.12 SENSITIVE Sensitive     LEVOFLOXACIN 0.5 SENSITIVE Sensitive     VANCOMYCIN 0.5 SENSITIVE Sensitive     PENICILLIN (meningitis) <=0.06 SENSITIVE Sensitive     PENO - penicillin  <=0.06      PENICILLIN (non-meningitis) <=0.06 SENSITIVE Sensitive     PENICILLIN (oral) <=0.06 SENSITIVE Sensitive     CEFTRIAXONE (non-meningitis) <=0.12 SENSITIVE Sensitive     CEFTRIAXONE (meningitis) <=0.12 SENSITIVE Sensitive     * STREPTOCOCCUS PNEUMONIAE  Blood Culture ID Panel (Reflexed)     Status: Abnormal   Collection Time: 03/19/23 12:05 AM  Result Value Ref Range Status   Enterococcus faecalis NOT DETECTED NOT DETECTED Final   Enterococcus Faecium NOT DETECTED NOT DETECTED Final   Listeria monocytogenes NOT DETECTED NOT DETECTED Final   Staphylococcus species NOT DETECTED NOT DETECTED Final   Staphylococcus aureus (BCID) NOT DETECTED NOT DETECTED Final   Staphylococcus epidermidis NOT DETECTED NOT DETECTED Final   Staphylococcus lugdunensis NOT DETECTED NOT DETECTED Final   Streptococcus species DETECTED (A) NOT DETECTED Final    Comment: CRITICAL RESULT CALLED TO, READ BACK BY AND VERIFIED WITH: PHARMD MELISSA JAMES [id number] AT 1601, DRT    Streptococcus agalactiae NOT DETECTED NOT DETECTED Final   Streptococcus pneumoniae DETECTED (A) NOT DETECTED Final    Comment: CRITICAL RESULT CALLED TO, READ BACK BY AND VERIFIED WITH: PHARMD MELISSA JAMES [id number] AT 1601, DRT    Streptococcus pyogenes NOT DETECTED NOT DETECTED Final   A.calcoaceticus-baumannii NOT DETECTED NOT DETECTED Final   Bacteroides fragilis NOT DETECTED NOT DETECTED Final   Enterobacterales  NOT DETECTED NOT DETECTED Final   Enterobacter cloacae complex NOT DETECTED NOT DETECTED Final   Escherichia coli NOT DETECTED NOT DETECTED Final   Klebsiella aerogenes NOT DETECTED NOT DETECTED Final   Klebsiella oxytoca NOT DETECTED NOT DETECTED Final   Klebsiella pneumoniae NOT DETECTED NOT DETECTED Final   Proteus species NOT DETECTED NOT DETECTED Final   Salmonella species NOT DETECTED NOT DETECTED Final   Serratia marcescens NOT DETECTED NOT DETECTED Final   Haemophilus influenzae NOT DETECTED NOT DETECTED Final    Neisseria meningitidis NOT DETECTED NOT DETECTED Final   Pseudomonas aeruginosa NOT DETECTED NOT DETECTED Final   Stenotrophomonas maltophilia NOT DETECTED NOT DETECTED Final   Candida albicans NOT DETECTED NOT DETECTED Final   Candida auris NOT DETECTED NOT DETECTED Final   Candida glabrata NOT DETECTED NOT DETECTED Final   Candida krusei NOT DETECTED NOT DETECTED Final   Candida parapsilosis NOT DETECTED NOT DETECTED Final   Candida tropicalis NOT DETECTED NOT DETECTED Final   Cryptococcus neoformans/gattii NOT DETECTED NOT DETECTED Final    Comment: Performed at [facility], [address] [place], [place] [address]  Blood Culture (routine x 2)     Status: Abnormal   Collection Time: 03/19/23 12:27 AM   Specimen: BLOOD RIGHT FOREARM  Result Value Ref Range Status   Specimen Description   Final    BLOOD RIGHT FOREARM Performed at [facility], [address], [place], [place] [address]    Special Requests   Final    BOTTLES DRAWN AEROBIC AND ANAEROBIC Blood Culture results may not be optimal due to an inadequate volume of blood received in culture bottles Performed at [facility], [address] [place], [place] [address]    Culture  Setup Time   Final    GRAM POSITIVE COCCI IN CHAINS IN BOTH AEROBIC AND ANAEROBIC BOTTLES    Culture (A)  Final    STREPTOCOCCUS PNEUMONIAE SUSCEPTIBILITIES PERFORMED ON PREVIOUS CULTURE WITHIN THE LAST 5 DAYS. Performed at [facility], [address], [place], [place] [address]    Report Status 03/21/2023 FINAL  Final  Respiratory (~20 pathogens) panel by PCR     Status: None   Collection Time: 03/19/23  1:05 AM   Specimen: Nasopharyngeal Swab; Respiratory  Result Value Ref Range Status   Adenovirus NOT DETECTED NOT DETECTED Final   Coronavirus 229E NOT DETECTED NOT DETECTED Final    Comment: (NOTE) The Coronavirus on the Respiratory Panel, DOES NOT test for the novel  Coronavirus (2019 nCoV)     Coronavirus HKU1 NOT DETECTED NOT DETECTED Final   Coronavirus NL63 NOT DETECTED NOT DETECTED Final   Coronavirus OC43 NOT DETECTED NOT DETECTED Final   Metapneumovirus NOT DETECTED NOT DETECTED Final   Rhinovirus / Enterovirus NOT DETECTED NOT DETECTED Final   Influenza A NOT DETECTED NOT DETECTED Final   Influenza B NOT DETECTED NOT DETECTED Final   Parainfluenza Virus 1 NOT DETECTED NOT DETECTED Final   Parainfluenza Virus 2 NOT DETECTED NOT DETECTED Final   Parainfluenza Virus 3 NOT DETECTED NOT DETECTED Final   Parainfluenza Virus 4 NOT DETECTED NOT DETECTED Final   Respiratory Syncytial Virus NOT DETECTED NOT DETECTED Final   Bordetella pertussis NOT DETECTED NOT DETECTED Final   Bordetella Parapertussis NOT DETECTED NOT DETECTED Final   Chlamydophila pneumoniae NOT DETECTED NOT DETECTED Final   Mycoplasma pneumoniae NOT DETECTED NOT DETECTED Final    Comment: Performed at [facility], [address] [place], [place] [address]  Culture, blood (Routine X 2) w Reflex to ID Panel     Status: None   Collection Time: 03/20/23  4:09 PM   Specimen: BLOOD  Result Value Ref Range Status   Specimen Description   Final    BLOOD SITE NOT SPECIFIED Performed at [facility], [address] [place], [place] [address]    Special Requests   Final    BOTTLES DRAWN AEROBIC ONLY Blood Culture results may not be optimal due to an inadequate volume of blood received in culture bottles Performed at [facility], [address], [place], [place] [address]    Culture   Final    NO GROWTH 5 DAYS Performed at [facility], [address] [place], [place] [address]    Report Status 03/25/2023 FINAL  Final  Culture, blood (Routine X 2) w Reflex to ID Panel     Status: None   Collection Time: 03/20/23  4:18 PM   Specimen: BLOOD LEFT ARM  Result Value Ref Range Status   Specimen Description   Final    BLOOD LEFT ARM Performed at [facility], [address], [place], [place] [address]    Special Requests   Final    BOTTLES DRAWN AEROBIC AND ANAEROBIC Blood Culture adequate volume Performed at [facility], [address], [place], [place] [address]    Culture   Final    NO GROWTH 5 DAYS Performed at [facility], [address] [place], [place] [address]    Report Status 03/25/2023 FINAL  Final    Labs: CBC: Recent Labs  Lab 03/19/23 0444 03/20/23 0347 03/21/23 0102 03/22/23 0500 03/23/23 1220  WBC 8.6 21.5* 17.7* 9.4 7.9  HGB 9.1* 8.2* 7.6* 7.8* 9.2*  HCT 27.0* 24.4* 22.9* 24.3* 27.5*  MCV 96.1 96.1 98.3 98.8 96.2  PLT 245 216 253 295 390   Basic Metabolic Panel: Recent Labs  Lab 03/19/23 0444 03/20/23 0347 03/21/23 0102 03/22/23 0500 03/23/23 1220  NA 135 140 135 138 136  K 3.3* 3.1* 3.4* 4.6 4.4  CL 107 112* 109 109 104  CO2 21* 21* 19* 21* 27  GLUCOSE 94 101* 113* 102* 95  BUN 22* 16 13 9 9   CREATININE 1.23* 1.00 0.79 0.89 0.60  CALCIUM 7.6* 7.4* 7.5* 8.0* 8.4*  MG  --   --  1.7  --   --    Liver Function Tests: Recent Labs  Lab 03/18/23 1931 03/19/23 0444  AST 20 14*  ALT 15 12  ALKPHOS 62 51  BILITOT 0.7 0.4  PROT 8.1 6.6  ALBUMIN 2.7* 2.1*   CBG: No results for input(s): "GLUCAP" in the last 168 hours.  Discharge time spent: greater than 30 minutes.  Signed: Alba Cory, MD [facility] 03/25/2023

## 2023-03-25 NOTE — Transfer of Care (Signed)
Immediate Anesthesia Transfer of Care Note  Patient: Connie Wells  Procedure(s) Performed: TRANSESOPHAGEAL ECHOCARDIOGRAM  Patient Location: PACU and Cath Lab  Anesthesia Type:MAC  Level of Consciousness: awake, alert , and oriented  Airway & Oxygen Therapy: Patient Spontanous Breathing and Patient connected to face mask oxygen  Post-op Assessment: Report given to RN and Post -op Vital signs reviewed and stable  Post vital signs: Reviewed and stable  Last Vitals:  Vitals Value Taken Time  BP    Temp    Pulse 92 03/25/23 1053  Resp 19 03/25/23 1053  SpO2 96 % 03/25/23 1053  Vitals shown include unfiled device data.  Last Pain:  Vitals:   03/25/23 1014  TempSrc:   PainSc: 0-No pain      Patients Stated Pain Goal: 3 (03/25/23 0050)  Complications: No notable events documented.

## 2023-03-25 NOTE — TOC Transition Note (Signed)
Transition of Care [facility]) - Discharge Note   Patient Details  Name: Connie Wells MRN: [id number] Date of Birth: Apr 05, 1985  Transition of Care [facility]) CM/SW Contact:  Lanier Clam, RN Phone Number: 03/25/2023, 4:36 PM   Clinical Narrative: d/c home.      Final next level of care: Home/Self Care Barriers to Discharge: No Barriers Identified   Patient Goals and CMS Choice            Discharge Placement                       Discharge Plan and Services Additional resources added to the After Visit Summary for                                       Social Drivers of Health (SDOH) Interventions SDOH Screenings   Food Insecurity: No Food Insecurity (03/20/2023)  Recent Concern: Food Insecurity - Food Insecurity Present (01/21/2023)  Housing: Low Risk  (03/19/2023)  Recent Concern: Housing - Medium Risk (01/21/2023)  Transportation Needs: No Transportation Needs (03/19/2023)  Recent Concern: Transportation Needs - Unmet Transportation Needs (01/21/2023)  Utilities: Not At Risk (03/19/2023)  Social Connections: Unknown (03/19/2023)  Tobacco Use: High Risk (03/25/2023)     Readmission Risk Interventions     No data to display

## 2023-03-26 ENCOUNTER — Inpatient Hospital Stay: Payer: Medicaid Other

## 2023-03-26 ENCOUNTER — Other Ambulatory Visit: Payer: Self-pay

## 2023-03-26 ENCOUNTER — Inpatient Hospital Stay: Payer: Medicaid Other | Admitting: Adult Health

## 2023-03-28 ENCOUNTER — Inpatient Hospital Stay: Payer: Medicaid Other

## 2023-03-28 ENCOUNTER — Telehealth: Payer: Self-pay | Admitting: *Deleted

## 2023-03-28 NOTE — Telephone Encounter (Signed)
Per MD due to pt recent hospitalization for pneumonia and decreased EF of 45-50%, pt tx needing to be canceled today.  Pt needing to f/u with Dr. Gala Romney for further evaluation and recommendations.  RN sent in-basket to MD and nurse for appt.

## 2023-03-31 ENCOUNTER — Telehealth: Payer: Self-pay

## 2023-04-01 ENCOUNTER — Other Ambulatory Visit (HOSPITAL_COMMUNITY): Payer: Self-pay | Admitting: *Deleted

## 2023-04-01 ENCOUNTER — Other Ambulatory Visit: Payer: Self-pay

## 2023-04-01 DIAGNOSIS — C50919 Malignant neoplasm of unspecified site of unspecified female breast: Secondary | ICD-10-CM

## 2023-04-01 DIAGNOSIS — T451X5A Adverse effect of antineoplastic and immunosuppressive drugs, initial encounter: Secondary | ICD-10-CM

## 2023-04-01 NOTE — Progress Notes (Signed)
 From: Cherrie Toribio SAUNDERS, MD  Sent: 03/30/2023  10:25 PM EST  To: Rudean LITTIE Franks, LPN; Mazie Boyer, RN; *  Subject: RE: decreased EF                               I have reviewed echo and feel like EF likely close 50-55%. OK to resume Herceptin  this month and we will get her in for repeat echo in March.      Echo order placed

## 2023-04-02 ENCOUNTER — Telehealth: Payer: Self-pay

## 2023-04-02 ENCOUNTER — Encounter: Payer: Self-pay | Admitting: Hematology and Oncology

## 2023-04-02 NOTE — Telephone Encounter (Signed)
 Connie Wells

## 2023-04-02 NOTE — Telephone Encounter (Signed)
Attempted to call pt to make her aware she can proceed with tx at [facility] this month, per Drs Gala Romney and [place]. LVM for call back to give her this info.

## 2023-04-03 ENCOUNTER — Ambulatory Visit: Payer: Self-pay | Admitting: Internal Medicine

## 2023-04-06 ENCOUNTER — Other Ambulatory Visit: Payer: Self-pay

## 2023-04-16 ENCOUNTER — Inpatient Hospital Stay: Payer: Medicaid Other

## 2023-04-16 ENCOUNTER — Ambulatory Visit: Payer: Medicaid Other | Admitting: Infectious Disease

## 2023-04-16 ENCOUNTER — Inpatient Hospital Stay: Payer: Medicaid Other | Attending: Hematology and Oncology | Admitting: Hematology and Oncology

## 2023-04-16 VITALS — BP 135/90 | HR 85 | Temp 98.1°F | Resp 16

## 2023-04-16 VITALS — BP 132/92 | HR 167 | Temp 98.4°F | Resp 18 | Ht 66.0 in | Wt 133.7 lb

## 2023-04-16 DIAGNOSIS — Z1731 Human epidermal growth factor receptor 2 positive status: Secondary | ICD-10-CM | POA: Insufficient documentation

## 2023-04-16 DIAGNOSIS — Z79899 Other long term (current) drug therapy: Secondary | ICD-10-CM | POA: Diagnosis not present

## 2023-04-16 DIAGNOSIS — Z171 Estrogen receptor negative status [ER-]: Secondary | ICD-10-CM | POA: Insufficient documentation

## 2023-04-16 DIAGNOSIS — Z1722 Progesterone receptor negative status: Secondary | ICD-10-CM | POA: Diagnosis not present

## 2023-04-16 DIAGNOSIS — Z5112 Encounter for antineoplastic immunotherapy: Secondary | ICD-10-CM | POA: Diagnosis present

## 2023-04-16 DIAGNOSIS — C787 Secondary malignant neoplasm of liver and intrahepatic bile duct: Secondary | ICD-10-CM | POA: Diagnosis present

## 2023-04-16 DIAGNOSIS — C50911 Malignant neoplasm of unspecified site of right female breast: Secondary | ICD-10-CM

## 2023-04-16 DIAGNOSIS — C7951 Secondary malignant neoplasm of bone: Secondary | ICD-10-CM | POA: Insufficient documentation

## 2023-04-16 DIAGNOSIS — C171 Malignant neoplasm of jejunum: Secondary | ICD-10-CM | POA: Diagnosis not present

## 2023-04-16 DIAGNOSIS — C50311 Malignant neoplasm of lower-inner quadrant of right female breast: Secondary | ICD-10-CM | POA: Diagnosis present

## 2023-04-16 LAB — CBC WITH DIFFERENTIAL (CANCER CENTER ONLY)
Abs Immature Granulocytes: 0.03 10*3/uL (ref 0.00–0.07)
Basophils Absolute: 0.1 10*3/uL (ref 0.0–0.1)
Basophils Relative: 1 %
Eosinophils Absolute: 0.1 10*3/uL (ref 0.0–0.5)
Eosinophils Relative: 1 %
HCT: 30.9 % — ABNORMAL LOW (ref 36.0–46.0)
Hemoglobin: 10.2 g/dL — ABNORMAL LOW (ref 12.0–15.0)
Immature Granulocytes: 0 %
Lymphocytes Relative: 34 %
Lymphs Abs: 2.6 10*3/uL (ref 0.7–4.0)
MCH: 32.2 pg (ref 26.0–34.0)
MCHC: 33 g/dL (ref 30.0–36.0)
MCV: 97.5 fL (ref 80.0–100.0)
Monocytes Absolute: 0.7 10*3/uL (ref 0.1–1.0)
Monocytes Relative: 9 %
Neutro Abs: 4.2 10*3/uL (ref 1.7–7.7)
Neutrophils Relative %: 55 %
Platelet Count: 236 10*3/uL (ref 150–400)
RBC: 3.17 MIL/uL — ABNORMAL LOW (ref 3.87–5.11)
RDW: 14.9 % (ref 11.5–15.5)
WBC Count: 7.6 10*3/uL (ref 4.0–10.5)
nRBC: 0 % (ref 0.0–0.2)

## 2023-04-16 LAB — CMP (CANCER CENTER ONLY)
ALT: 6 U/L (ref 0–44)
AST: 12 U/L — ABNORMAL LOW (ref 15–41)
Albumin: 3.1 g/dL — ABNORMAL LOW (ref 3.5–5.0)
Alkaline Phosphatase: 41 U/L (ref 38–126)
Anion gap: 2 — ABNORMAL LOW (ref 5–15)
BUN: 9 mg/dL (ref 6–20)
CO2: 27 mmol/L (ref 22–32)
Calcium: 8.4 mg/dL — ABNORMAL LOW (ref 8.9–10.3)
Chloride: 106 mmol/L (ref 98–111)
Creatinine: 0.72 mg/dL (ref 0.44–1.00)
GFR, Estimated: >60 mL/min (ref >60)
Glucose, Bld: 87 mg/dL (ref 70–99)
Potassium: 3.6 mmol/L (ref 3.5–5.1)
Sodium: 135 mmol/L (ref 135–145)
Total Bilirubin: 0.3 mg/dL (ref 0.0–1.2)
Total Protein: 7.1 g/dL (ref 6.5–8.1)

## 2023-04-16 MED ORDER — SODIUM CHLORIDE 0.9% FLUSH
10.0000 mL | INTRAVENOUS | Status: DC | PRN
  Administered 2023-04-16: 10 mL

## 2023-04-16 MED ORDER — DIPHENHYDRAMINE HCL 25 MG PO CAPS
50.0000 mg | ORAL_CAPSULE | Freq: Once | ORAL | Status: AC
  Administered 2023-04-16: 50 mg via ORAL
  Filled 2023-04-16: qty 2

## 2023-04-16 MED ORDER — ACETAMINOPHEN 325 MG PO TABS
650.0000 mg | ORAL_TABLET | Freq: Once | ORAL | Status: AC
  Administered 2023-04-16: 650 mg via ORAL
  Filled 2023-04-16: qty 2

## 2023-04-16 MED ORDER — HEPARIN SOD (PORK) LOCK FLUSH 100 UNIT/ML IV SOLN
500.0000 [IU] | Freq: Once | INTRAVENOUS | Status: AC | PRN
  Administered 2023-04-16: 500 [IU]

## 2023-04-16 MED ORDER — TRASTUZUMAB-ANNS CHEMO 150 MG IV SOLR
6.0000 mg/kg | Freq: Once | INTRAVENOUS | Status: AC
  Administered 2023-04-16: 357 mg via INTRAVENOUS
  Filled 2023-04-16: qty 17

## 2023-04-16 MED ORDER — PERTUZUMAB CHEMO INJECTION 420 MG/14ML
420.0000 mg | Freq: Once | INTRAVENOUS | Status: AC
  Administered 2023-04-16: 420 mg via INTRAVENOUS
  Filled 2023-04-16: qty 14

## 2023-04-16 MED ORDER — SODIUM CHLORIDE 0.9 % IV SOLN: INTRAVENOUS | Status: DC

## 2023-04-16 NOTE — Progress Notes (Signed)
Patient Care Team: Default, Provider, MD as PCP - General  DIAGNOSIS:  Encounter Diagnosis  Name Primary?   Carcinoma of right breast metastatic to multiple sites [facility]) Yes    SUMMARY OF ONCOLOGIC HISTORY: Oncology History  Breast cancer metastasized to multiple sites (HCC)  06/16/2020 Initial Diagnosis   [place]:  stage IV: T2 N1 M1 ER/PR negative HER2 positive metastatic breast cancer (diagnosed and treated by Dr. Welton Flakes) 08/09/2020 -11/02/2020 6 cycles of THP, on Herceptin and Perjeta maintenance Right breast DCIS   01/21/2023 -  Chemotherapy   Patient is on Treatment Plan : BREAST Trastuzumab + Pertuzumab q21d       CHIEF COMPLIANT: Herceptin Perjeta maintenance  HISTORY OF PRESENT ILLNESS:   History of Present Illness   Connie Wells is a 38 year old female who presents for follow-up after hospitalization for a cough and related symptoms. She was accompanied by her boyfriend and her mother during her hospital stay.  She is undergoing treatment for metastatic breast cancer, with recent scans indicating improvement and tumor shrinkage.  She experienced significant improvement in her cough and related symptoms following antibiotic treatment during her recent hospitalization. Her hemoglobin levels were noted to be improving at the time of discharge.    ALLERGIES:  has no known allergies.  MEDICATIONS:  Current Outpatient Medications  Medication Sig Dispense Refill   carvedilol (COREG) 3.125 MG tablet Take 1 tablet (3.125 mg total) by mouth 2 (two) times daily with a meal. 60 tablet 1   guaiFENesin (MUCINEX) 600 MG 12 hr tablet Take 2 tablets (1,200 mg total) by mouth 2 (two) times daily. 120 tablet 0   losartan (COZAAR) 25 MG tablet Take 1 tablet (25 mg total) by mouth daily. 30 tablet 0   albuterol (VENTOLIN HFA) 108 (90 Base) MCG/ACT inhaler Inhale 2 puffs into the lungs every 6 (six) hours as needed for wheezing or shortness of breath. (Patient not taking:  Reported on 04/16/2023) 18 g 2   folic acid (FOLVITE) 1 MG tablet Take 1 tablet (1 mg total) by mouth daily. (Patient not taking: Reported on 04/16/2023) 30 tablet 0   ibuprofen (ADVIL) 200 MG tablet Take 200 mg by mouth every 6 (six) hours as needed. (Patient not taking: Reported on 04/16/2023)     No current facility-administered medications for this visit.    PHYSICAL EXAMINATION: ECOG PERFORMANCE STATUS: 1 - Symptomatic but completely ambulatory  Vitals:   04/16/23 1100  BP: (!) 132/92  Pulse: (!) 167  Resp: 18  Temp: 98.4 F (36.9 C)  SpO2: 97%   Filed Weights   04/16/23 1100  Weight: 133 lb 11.2 oz (60.6 kg)    Physical Exam          (exam performed in the presence of a chaperone)  LABORATORY DATA:  I have reviewed the data as listed    Latest Ref Rng & Units 03/23/2023   12:20 PM 03/22/2023    5:00 AM 03/21/2023    1:02 AM  CMP  Glucose 70 - 99 mg/dL 95  295  284   BUN 6 - 20 mg/dL 9  9  13    Creatinine 0.44 - 1.00 mg/dL 1.32  4.40  1.02   Sodium 135 - 145 mmol/L 136  138  135   Potassium 3.5 - 5.1 mmol/L 4.4  4.6  3.4   Chloride 98 - 111 mmol/L 104  109  109   CO2 22 - 32 mmol/L 27  21  19  Calcium 8.9 - 10.3 mg/dL 8.4  8.0  7.5     Lab Results  Component Value Date   WBC 7.9 03/23/2023   HGB 9.2 (L) 03/23/2023   HCT 27.5 (L) 03/23/2023   MCV 96.2 03/23/2023   PLT 390 03/23/2023   NEUTROABS 3.3 01/21/2023    ASSESSMENT & PLAN:  Breast cancer metastasized to multiple sites (HCC) 06/16/2020 stage IV: T2 N1 M1 ER/PR negative HER2 positive metastatic breast cancer (diagnosed and treated by Dr. Welton Flakes) 08/09/2020 -11/02/2020 6 cycles of THP, on Herceptin and Perjeta maintenance Right breast DCIS Current treatment: Herceptin Perjeta maintenance since 12/14/2020 (discontinued November 2023 after she left Dr. Milta Deiters clinic)   CT CAP 03/18/2023: New dense consolidation right lower lobe, minimal spillover to right middle lobe favoring bacterial pneumonia versus  pulmonary hemorrhage, substantial improvement in the right breast lesions and right axillary lymph nodes, left adrenal mass  2 cm: Benign  Hospitalization 03/18/2023-03/25/2023: Pneumococcal bacteremia and sepsis   Treatment plan: Resume Herceptin and Perjeta Echocardiogram completed 01/20/2023: Echo by Dr. Gala Romney 01/20/23 EF 60-65%, GLS -16.9%  Echo 03/21/2023: 45 to 50% (Dr. Gala Romney reviewed it and felt it was more 50 to 55% and that he was fine for [place] to proceed with her treatment)   Severe anemia during hospitalization: I would like to get a CBC CMP drawn today but we do not need to wait for the results. Please proceed with treatment without waiting for the labs.   Return to clinic every 3 weeks for Herceptin Perjeta and every 6 weeks for follow-up with me.     Orders Placed This Encounter  Procedures   CBC with Differential (Cancer Center Only)    Standing Status:   Future    Expiration Date:   04/15/2024   CMP (Cancer Center only)    Standing Status:   Future    Expiration Date:   04/15/2024   The patient has a good understanding of the overall plan. she agrees with it. she will call with any problems that may develop before the next visit here. Total time spent: 30 mins including face to face time and time spent for planning, charting and co-ordination of care   Tamsen Meek, MD 04/16/23

## 2023-04-16 NOTE — Assessment & Plan Note (Signed)
06/16/2020 stage IV: T2 N1 M1 ER/PR negative HER2 positive metastatic breast cancer (diagnosed and treated by Dr. Welton Flakes) 08/09/2020 -11/02/2020 6 cycles of THP, on Herceptin and Perjeta maintenance Right breast DCIS Current treatment: Herceptin Perjeta maintenance since 12/14/2020 (discontinued November 2023 after she left Dr. Milta Deiters clinic)   CT CAP 03/18/2023: New dense consolidation right lower lobe, minimal spillover to right middle lobe favoring bacterial pneumonia versus pulmonary hemorrhage, substantial improvement in the right breast lesions and right axillary lymph nodes, left adrenal mass  2 cm: Benign  Hospitalization 03/18/2023-03/25/2023: Pneumococcal bacteremia and sepsis   Treatment plan: Resume Herceptin and Perjeta Echocardiogram completed 01/20/2023: Echo by Dr. Gala Romney 01/20/23 EF 60-65%, GLS -16.9%  Echo 03/21/2023: 45 to 50% (Dr. Gala Romney reviewed it and felt it was more 50 to 55% and that he was fine for [place] to proceed with her treatment)      Refer patient to counselors for emotional support and management of depression. Return to clinic every 3 weeks for Herceptin Perjeta and every 6 weeks for follow-up with me.

## 2023-04-16 NOTE — Patient Instructions (Signed)
 [facility] - A DEPT OF [facility]  Discharge Instructions: Thank you for choosing [facility] to provide your oncology and hematology care.   If you have a lab appointment with the Cancer Center, please go directly to the Cancer Center and check in at the registration area.   Wear comfortable clothing and clothing appropriate for easy access to any Portacath or PICC line.   We strive to give you quality time with your provider. You may need to reschedule your appointment if you arrive late (15 or more minutes).  Arriving late affects you and other patients whose appointments are after yours.  Also, if you miss three or more appointments without notifying the office, you may be dismissed from the clinic at the provider's discretion.      For prescription refill requests, have your pharmacy contact our office and allow 72 hours for refills to be completed.    Today you received the following chemotherapy and/or immunotherapy agents: Kanjinti, Perjeta      To help prevent nausea and vomiting after your treatment, we encourage you to take your nausea medication as directed.  BELOW ARE SYMPTOMS THAT SHOULD BE REPORTED IMMEDIATELY: *FEVER GREATER THAN 100.4 F (38 C) OR HIGHER *CHILLS OR SWEATING *NAUSEA AND VOMITING THAT IS NOT CONTROLLED WITH YOUR NAUSEA MEDICATION *UNUSUAL SHORTNESS OF BREATH *UNUSUAL BRUISING OR BLEEDING *URINARY PROBLEMS (pain or burning when urinating, or frequent urination) *BOWEL PROBLEMS (unusual diarrhea, constipation, pain near the anus) TENDERNESS IN MOUTH AND THROAT WITH OR WITHOUT PRESENCE OF ULCERS (sore throat, sores in mouth, or a toothache) UNUSUAL RASH, SWELLING OR PAIN  UNUSUAL VAGINAL DISCHARGE OR ITCHING   Items with * indicate a potential emergency and should be followed up as soon as possible or go to the Emergency Department if any problems should occur.  Please show the CHEMOTHERAPY ALERT CARD or  IMMUNOTHERAPY ALERT CARD at check-in to the Emergency Department and triage nurse.  Should you have questions after your visit or need to cancel or reschedule your appointment, please contact [facility] - A DEPT OF [facility]  Dept: [phone]  and follow the prompts.  Office hours are 8:00 a.m. to 4:30 p.m. Monday - Friday. Please note that voicemails left after 4:00 p.m. may not be returned until the following business day.  We are closed weekends and major holidays. You have access to a nurse at all times for urgent questions. Please call the main number to the clinic Dept: [phone] and follow the prompts.   For any non-urgent questions, you may also contact your provider using MyChart. We now offer e-Visits for anyone 77 and older to request care online for non-urgent symptoms. For details visit [email or web address].   Also download the MyChart app! Go to the app store, search "MyChart", open the app, select [place], and log in with your MyChart username and password.

## 2023-04-18 ENCOUNTER — Other Ambulatory Visit: Payer: Self-pay

## 2023-04-30 ENCOUNTER — Ambulatory Visit (INDEPENDENT_AMBULATORY_CARE_PROVIDER_SITE_OTHER): Payer: Medicaid Other | Admitting: Infectious Disease

## 2023-04-30 ENCOUNTER — Encounter: Payer: Self-pay | Admitting: Infectious Disease

## 2023-04-30 ENCOUNTER — Other Ambulatory Visit: Payer: Self-pay

## 2023-04-30 VITALS — BP 119/78 | HR 82 | Temp 98.3°F | Wt 133.6 lb

## 2023-04-30 DIAGNOSIS — C50911 Malignant neoplasm of unspecified site of right female breast: Secondary | ICD-10-CM | POA: Diagnosis not present

## 2023-04-30 DIAGNOSIS — B953 Streptococcus pneumoniae as the cause of diseases classified elsewhere: Secondary | ICD-10-CM

## 2023-04-30 DIAGNOSIS — R7881 Bacteremia: Secondary | ICD-10-CM

## 2023-04-30 DIAGNOSIS — J13 Pneumonia due to Streptococcus pneumoniae: Secondary | ICD-10-CM

## 2023-04-30 NOTE — Progress Notes (Signed)
 Subjective:  Chief complaint: follow-up for pneumococcal pneumonia and bacteremia   Patient ID: Connie Wells, female    DOB: [birth year], 38 y.o.   MRN: [id number]  HPI  Discussed the use of AI scribe software for clinical note transcription with the patient, who gave verbal consent to proceed.  History of Present Illness   The patient, with a history of pneumonia and bloodstream infection caused by Streptococcus pneumoniae, presents for a follow-up visit after hospitalization. She was unaware of the severity of her condition, believing she was only being treated for pneumonia. During her hospital stay, she underwent a transesophageal echo due to tricuspid regurgitation, which showed no infection of the heart valves. She was transitioned from IV to oral antibiotics, which she has completed more than two weeks ago. She also has a port, which was not removed. She reports feeling well currently.  The patient is also under the care of Dr. Dian Situ, who has resumed her Herceptin and Perjeta treatment. She is scheduled to see him on the twelfth.       Past Medical History:  Diagnosis Date   Herpes genitalia    Solitary kidney     Past Surgical History:  Procedure Laterality Date   DILATION AND EVACUATION     ECTOPIC PREGNANCY SURGERY     TRANSESOPHAGEAL ECHOCARDIOGRAM (CATH LAB) N/A 03/25/2023   Procedure: TRANSESOPHAGEAL ECHOCARDIOGRAM;  Surgeon: Chilton Si, MD;  Location: [facility] INVASIVE CV LAB;  Service: Cardiovascular;  Laterality: N/A;    Family History  Problem Relation Age of Onset   Diabetes Mother    Hypertension Mother    Diabetes Maternal Aunt    Asthma Brother       Social History   Socioeconomic History   Marital status: Single    Spouse name: Not on file   Number of children: Not on file   Years of education: Not on file   Highest education level: Not on file  Occupational History   Not on file  Tobacco Use   Smoking status: Every Day    Current  packs/day: 0.50    Average packs/day: 0.5 packs/day for 5.0 years (2.5 ttl pk-yrs)    Types: Cigarettes   Smokeless tobacco: Never  Substance and Sexual Activity   Alcohol use: No    Comment: Occassional   Drug use: Yes    Types: Marijuana   Sexual activity: Yes    Birth control/protection: None  Other Topics Concern   Not on file  Social History Narrative   Not on file   Social Drivers of Health   Financial Resource Strain: Not on file  Food Insecurity: No Food Insecurity (03/20/2023)   Hunger Vital Sign    Worried About Running Out of Food in the Last Year: Never true    Ran Out of Food in the Last Year: Never true  Recent Concern: Food Insecurity - Food Insecurity Present (01/21/2023)   Hunger Vital Sign    Worried About Running Out of Food in the Last Year: Sometimes true    Ran Out of Food in the Last Year: Sometimes true  Transportation Needs: No Transportation Needs (03/19/2023)   PRAPARE - Administrator, Civil Service (Medical): No    Lack of Transportation (Non-Medical): No  Recent Concern: Transportation Needs - Unmet Transportation Needs (01/21/2023)   PRAPARE - Administrator, Civil Service (Medical): Yes    Lack of Transportation (Non-Medical): No  Physical Activity: Not on file  Stress: Not  on file  Social Connections: Unknown (03/19/2023)   Social Connection and Isolation Panel [NHANES]    Frequency of Communication with Friends and Family: More than three times a week    Frequency of Social Gatherings with Friends and Family: More than three times a week    Attends Religious Services: Patient declined    Database administrator or Organizations: Yes    Attends Banker Meetings: Patient declined    Marital Status: Not on file    No Known Allergies   Current Outpatient Medications:    carvedilol (COREG) 3.125 MG tablet, Take 1 tablet (3.125 mg total) by mouth 2 (two) times daily with a meal., Disp: 60 tablet, Rfl: 1    losartan (COZAAR) 25 MG tablet, Take 1 tablet (25 mg total) by mouth daily., Disp: 30 tablet, Rfl: 0   albuterol (VENTOLIN HFA) 108 (90 Base) MCG/ACT inhaler, Inhale 2 puffs into the lungs every 6 (six) hours as needed for wheezing or shortness of breath. (Patient not taking: Reported on 04/30/2023), Disp: 18 g, Rfl: 2   folic acid (FOLVITE) 1 MG tablet, Take 1 tablet (1 mg total) by mouth daily. (Patient not taking: Reported on 04/30/2023), Disp: 30 tablet, Rfl: 0   guaiFENesin (MUCINEX) 600 MG 12 hr tablet, Take 2 tablets (1,200 mg total) by mouth 2 (two) times daily. (Patient not taking: Reported on 04/30/2023), Disp: 120 tablet, Rfl: 0   ibuprofen (ADVIL) 200 MG tablet, Take 200 mg by mouth every 6 (six) hours as needed. (Patient not taking: Reported on 04/16/2023), Disp: , Rfl:    Review of Systems  Constitutional:  Negative for activity change, appetite change, chills, diaphoresis, fatigue, fever and unexpected weight change.  HENT:  Negative for congestion, rhinorrhea, sinus pressure, sneezing, sore throat and trouble swallowing.   Eyes:  Negative for photophobia and visual disturbance.  Respiratory:  Negative for cough, chest tightness, shortness of breath, wheezing and stridor.   Cardiovascular:  Negative for chest pain, palpitations and leg swelling.  Gastrointestinal:  Negative for abdominal distention, abdominal pain, anal bleeding, blood in stool, constipation, diarrhea, nausea and vomiting.  Genitourinary:  Negative for difficulty urinating, dysuria, flank pain and hematuria.  Musculoskeletal:  Negative for arthralgias, back pain, gait problem, joint swelling and myalgias.  Skin:  Negative for color change, pallor, rash and wound.  Neurological:  Negative for dizziness, tremors, weakness and light-headedness.  Hematological:  Negative for adenopathy. Does not bruise/bleed easily.  Psychiatric/Behavioral:  Negative for agitation, behavioral problems, confusion, decreased concentration,  dysphoric mood and sleep disturbance.        Objective:   Physical Exam Constitutional:      General: She is not in acute distress.    Appearance: Normal appearance. She is underweight. She is not ill-appearing or diaphoretic.  HENT:     Head: Normocephalic and atraumatic.     Right Ear: Hearing and external ear normal.     Left Ear: Hearing and external ear normal.     Nose: No nasal deformity or rhinorrhea.  Eyes:     General: No scleral icterus.    Conjunctiva/sclera: Conjunctivae normal.     Right eye: Right conjunctiva is not injected.     Left eye: Left conjunctiva is not injected.     Pupils: Pupils are equal, round, and reactive to light.  Neck:     Vascular: No JVD.  Cardiovascular:     Rate and Rhythm: Normal rate and regular rhythm.     Heart sounds:  Normal heart sounds, S1 normal and S2 normal. No murmur heard.    No friction rub.  Abdominal:     General: Bowel sounds are normal. There is no distension.     Palpations: Abdomen is soft.     Tenderness: There is no abdominal tenderness.  Musculoskeletal:        General: Normal range of motion.     Right shoulder: Normal.     Left shoulder: Normal.     Cervical back: Normal range of motion and neck supple.     Right hip: Normal.     Left hip: Normal.     Right knee: Normal.     Left knee: Normal.  Lymphadenopathy:     Head:     Right side of head: No submandibular, preauricular or posterior auricular adenopathy.     Left side of head: No submandibular, preauricular or posterior auricular adenopathy.     Cervical: No cervical adenopathy.     Right cervical: No superficial or deep cervical adenopathy.    Left cervical: No superficial or deep cervical adenopathy.  Skin:    General: Skin is warm and dry.     Coloration: Skin is not pale.     Findings: No abrasion, bruising, ecchymosis, erythema, lesion or rash.     Nails: There is no clubbing.  Neurological:     Mental Status: She is alert and oriented to  person, place, and time.     Sensory: No sensory deficit.     Coordination: Coordination normal.     Gait: Gait normal.  Psychiatric:        Attention and Perception: She is attentive.        Speech: Speech normal.        Behavior: Behavior normal. Behavior is cooperative.        Thought Content: Thought content normal.        Judgment: Judgment normal.           Assessment & Plan:   Assessment and Plan    Pneumococcal pneumonia with bacteremia Previously hospitalized with pneumococcal pneumonia and Streptococcus pneumoniae bacteremia. Transitioned to oral antibiotics, completed over two weeks ago. Clinically stable. Surveillance blood cultures necessary due to port presence. - Obtain peripheral blood cultures from two sites to ensure bacterial clearance.  Tricuspid regurgitation Tricuspid regurgitation identified. No valvular infection detected.  Breast cancer Under care of Dr. Dian Situ. Resumed Herceptin and Perjeta. - Scheduled to see Dr. Dian Situ on March 12th.     I have personally spent 27 minutes involved in face-to-face and non-face-to-face activities for this patient on the day of the visit. Professional time spent includes the following activities: Preparing to see the patient (review of tests), Obtaining and/or reviewing separately obtained history (admission/discharge record), Performing a medically appropriate examination and/or evaluation , Ordering medications/tests/procedures, referring and communicating with other health care professionals, Documenting clinical information in the EMR, Independently interpreting results (not separately reported), Communicating results to the patient/family/caregiver, Counseling and educating the patient/family/caregiver and Care coordination (not separately reported).

## 2023-05-03 ENCOUNTER — Telehealth: Payer: Self-pay | Admitting: Hematology and Oncology

## 2023-05-03 NOTE — Telephone Encounter (Signed)
 Scheduled appointments per WQ. Talked with the patients mother Mrs.Rene Kocher and she is aware of all made appointments for the patient.

## 2023-05-04 ENCOUNTER — Other Ambulatory Visit: Payer: Self-pay

## 2023-05-06 LAB — CULTURE, BLOOD (SINGLE)
MICRO NUMBER:: 16163257
MICRO NUMBER:: 16163272
Result:: NO GROWTH
SPECIMEN QUALITY:: ADEQUATE

## 2023-05-07 ENCOUNTER — Telehealth: Payer: Self-pay | Admitting: *Deleted

## 2023-05-07 ENCOUNTER — Inpatient Hospital Stay: Payer: Medicaid Other

## 2023-05-07 ENCOUNTER — Ambulatory Visit: Payer: Medicaid Other | Admitting: Hematology and Oncology

## 2023-05-07 NOTE — Telephone Encounter (Signed)
 Pt contacted infusion room stating she is not abe to come in for tx today.  RN attempt x1 to contact pt for availability.  Phone is not connected to service at this time.  Our office will have to wait for pt to contact us to re-schedule.

## 2023-05-12 ENCOUNTER — Telehealth: Payer: Self-pay | Admitting: Hematology and Oncology

## 2023-05-12 NOTE — Telephone Encounter (Signed)
 Rescheduled and scheduled appointment due to patient missing appointments. Talked with the patient and she is aware of all made and rescheduled appointments.

## 2023-05-13 ENCOUNTER — Other Ambulatory Visit: Payer: Self-pay | Admitting: Hematology and Oncology

## 2023-05-14 ENCOUNTER — Other Ambulatory Visit: Payer: Self-pay

## 2023-05-15 ENCOUNTER — Inpatient Hospital Stay: Payer: Medicaid Other | Admitting: Pulmonary Disease

## 2023-05-15 ENCOUNTER — Encounter: Payer: Self-pay | Admitting: Pulmonary Disease

## 2023-05-15 ENCOUNTER — Ambulatory Visit

## 2023-05-15 ENCOUNTER — Other Ambulatory Visit: Payer: Self-pay

## 2023-05-15 DIAGNOSIS — B59 Pneumocystosis: Secondary | ICD-10-CM

## 2023-05-16 ENCOUNTER — Other Ambulatory Visit: Payer: Self-pay | Admitting: Pharmacist

## 2023-05-16 ENCOUNTER — Telehealth: Payer: Self-pay | Admitting: Hematology and Oncology

## 2023-05-16 ENCOUNTER — Inpatient Hospital Stay

## 2023-05-16 DIAGNOSIS — C50911 Malignant neoplasm of unspecified site of right female breast: Secondary | ICD-10-CM

## 2023-05-16 NOTE — Telephone Encounter (Signed)
 Called to get the patient scheduled per 3/21 secure chat. Called the patient x2 and the "call could not be completed at this time." Called the patients friend Mrs.Krystal about the patients phone not working again. Mrs.Krystal states she will inform the patient of her upcoming appointment on Monday 3/24 at 1:15. Mrs.Krystal expressed understanding.

## 2023-05-19 ENCOUNTER — Inpatient Hospital Stay: Attending: Hematology and Oncology

## 2023-05-19 VITALS — BP 160/94 | HR 96 | Temp 98.4°F | Resp 16 | Wt 130.1 lb

## 2023-05-19 DIAGNOSIS — Z1731 Human epidermal growth factor receptor 2 positive status: Secondary | ICD-10-CM | POA: Diagnosis not present

## 2023-05-19 DIAGNOSIS — Z171 Estrogen receptor negative status [ER-]: Secondary | ICD-10-CM | POA: Diagnosis not present

## 2023-05-19 DIAGNOSIS — Z5112 Encounter for antineoplastic immunotherapy: Secondary | ICD-10-CM | POA: Insufficient documentation

## 2023-05-19 DIAGNOSIS — Z1722 Progesterone receptor negative status: Secondary | ICD-10-CM | POA: Diagnosis not present

## 2023-05-19 DIAGNOSIS — C50911 Malignant neoplasm of unspecified site of right female breast: Secondary | ICD-10-CM

## 2023-05-19 DIAGNOSIS — C50311 Malignant neoplasm of lower-inner quadrant of right female breast: Secondary | ICD-10-CM | POA: Diagnosis present

## 2023-05-19 DIAGNOSIS — C787 Secondary malignant neoplasm of liver and intrahepatic bile duct: Secondary | ICD-10-CM | POA: Diagnosis present

## 2023-05-19 MED ORDER — SODIUM CHLORIDE 0.9 % IV SOLN
INTRAVENOUS | Status: DC
Start: 2023-05-19 — End: 2023-05-19

## 2023-05-19 MED ORDER — SODIUM CHLORIDE 0.9 % IV SOLN
420.0000 mg | Freq: Once | INTRAVENOUS | Status: AC
  Administered 2023-05-19: 420 mg via INTRAVENOUS
  Filled 2023-05-19: qty 14

## 2023-05-19 MED ORDER — TRASTUZUMAB-ANNS CHEMO 150 MG IV SOLR
6.0000 mg/kg | Freq: Once | INTRAVENOUS | Status: AC
  Administered 2023-05-19: 357 mg via INTRAVENOUS
  Filled 2023-05-19: qty 17

## 2023-05-19 MED ORDER — DIPHENHYDRAMINE HCL 25 MG PO CAPS
50.0000 mg | ORAL_CAPSULE | Freq: Once | ORAL | Status: AC
  Administered 2023-05-19: 50 mg via ORAL
  Filled 2023-05-19: qty 2

## 2023-05-19 MED ORDER — ACETAMINOPHEN 325 MG PO TABS
650.0000 mg | ORAL_TABLET | Freq: Once | ORAL | Status: AC
Start: 2023-05-19 — End: 2023-05-19
  Administered 2023-05-19: 650 mg via ORAL
  Filled 2023-05-19: qty 2

## 2023-05-19 NOTE — Patient Instructions (Signed)
 [facility] - A DEPT OF [facility]  Discharge Instructions: Thank you for choosing [facility] to provide your oncology and hematology care.   If you have a lab appointment with the Cancer Center, please go directly to the Cancer Center and check in at the registration area.   Wear comfortable clothing and clothing appropriate for easy access to any Portacath or PICC line.   We strive to give you quality time with your provider. You may need to reschedule your appointment if you arrive late (15 or more minutes).  Arriving late affects you and other patients whose appointments are after yours.  Also, if you miss three or more appointments without notifying the office, you may be dismissed from the clinic at the provider's discretion.      For prescription refill requests, have your pharmacy contact our office and allow 72 hours for refills to be completed.    Today you received the following chemotherapy and/or immunotherapy agents: Kanjinti, Perjeta      To help prevent nausea and vomiting after your treatment, we encourage you to take your nausea medication as directed.  BELOW ARE SYMPTOMS THAT SHOULD BE REPORTED IMMEDIATELY: *FEVER GREATER THAN 100.4 F (38 C) OR HIGHER *CHILLS OR SWEATING *NAUSEA AND VOMITING THAT IS NOT CONTROLLED WITH YOUR NAUSEA MEDICATION *UNUSUAL SHORTNESS OF BREATH *UNUSUAL BRUISING OR BLEEDING *URINARY PROBLEMS (pain or burning when urinating, or frequent urination) *BOWEL PROBLEMS (unusual diarrhea, constipation, pain near the anus) TENDERNESS IN MOUTH AND THROAT WITH OR WITHOUT PRESENCE OF ULCERS (sore throat, sores in mouth, or a toothache) UNUSUAL RASH, SWELLING OR PAIN  UNUSUAL VAGINAL DISCHARGE OR ITCHING   Items with * indicate a potential emergency and should be followed up as soon as possible or go to the Emergency Department if any problems should occur.  Please show the CHEMOTHERAPY ALERT CARD or  IMMUNOTHERAPY ALERT CARD at check-in to the Emergency Department and triage nurse.  Should you have questions after your visit or need to cancel or reschedule your appointment, please contact [facility] - A DEPT OF [facility]  Dept: [phone]  and follow the prompts.  Office hours are 8:00 a.m. to 4:30 p.m. Monday - Friday. Please note that voicemails left after 4:00 p.m. may not be returned until the following business day.  We are closed weekends and major holidays. You have access to a nurse at all times for urgent questions. Please call the main number to the clinic Dept: [phone] and follow the prompts.   For any non-urgent questions, you may also contact your provider using MyChart. We now offer e-Visits for anyone 77 and older to request care online for non-urgent symptoms. For details visit [email or web address].   Also download the MyChart app! Go to the app store, search "MyChart", open the app, select [place], and log in with your MyChart username and password.

## 2023-05-23 ENCOUNTER — Telehealth (HOSPITAL_COMMUNITY): Payer: Self-pay | Admitting: Internal Medicine

## 2023-05-23 NOTE — Telephone Encounter (Signed)
 Called to confirm/remind patient of their appointment [facility] on [facility]/23[facility]***.   Appointment:   [] Confirmed  [x] Left mess   [] No answer/No voice mail  [] Phone not in service  Patient reminded to bring all medications and/or complete list.  Confirmed patient has transportation. Gave directions, instructed to utilize valet parking.

## 2023-05-26 ENCOUNTER — Ambulatory Visit (HOSPITAL_COMMUNITY): Admission: RE | Admit: 2023-05-26 | Payer: Medicaid Other | Source: Ambulatory Visit

## 2023-05-26 ENCOUNTER — Encounter (HOSPITAL_COMMUNITY): Payer: Medicaid Other | Admitting: Internal Medicine

## 2023-05-28 ENCOUNTER — Ambulatory Visit: Payer: Medicaid Other | Admitting: Hematology and Oncology

## 2023-05-28 ENCOUNTER — Ambulatory Visit: Payer: Medicaid Other

## 2023-05-29 ENCOUNTER — Ambulatory Visit: Payer: Medicaid Other

## 2023-05-29 ENCOUNTER — Ambulatory Visit: Payer: Medicaid Other | Admitting: Hematology and Oncology

## 2023-06-04 ENCOUNTER — Encounter (HOSPITAL_COMMUNITY): Payer: Self-pay | Admitting: *Deleted

## 2023-06-05 ENCOUNTER — Inpatient Hospital Stay: Attending: Hematology and Oncology | Admitting: Adult Health

## 2023-06-05 ENCOUNTER — Inpatient Hospital Stay: Attending: Hematology and Oncology

## 2023-06-05 ENCOUNTER — Telehealth: Payer: Self-pay | Admitting: *Deleted

## 2023-06-05 DIAGNOSIS — Z171 Estrogen receptor negative status [ER-]: Secondary | ICD-10-CM | POA: Insufficient documentation

## 2023-06-05 DIAGNOSIS — F17210 Nicotine dependence, cigarettes, uncomplicated: Secondary | ICD-10-CM | POA: Insufficient documentation

## 2023-06-05 DIAGNOSIS — C50311 Malignant neoplasm of lower-inner quadrant of right female breast: Secondary | ICD-10-CM | POA: Insufficient documentation

## 2023-06-05 DIAGNOSIS — Z1731 Human epidermal growth factor receptor 2 positive status: Secondary | ICD-10-CM | POA: Insufficient documentation

## 2023-06-05 DIAGNOSIS — C787 Secondary malignant neoplasm of liver and intrahepatic bile duct: Secondary | ICD-10-CM | POA: Insufficient documentation

## 2023-06-05 DIAGNOSIS — Z5112 Encounter for antineoplastic immunotherapy: Secondary | ICD-10-CM | POA: Insufficient documentation

## 2023-06-05 DIAGNOSIS — Z1721 Progesterone receptor positive status: Secondary | ICD-10-CM | POA: Insufficient documentation

## 2023-06-05 NOTE — Telephone Encounter (Signed)
 Called to f/u with pt about missed appt. Phone on file isn't valid

## 2023-06-06 ENCOUNTER — Telehealth: Payer: Self-pay | Admitting: Adult Health

## 2023-06-06 NOTE — Telephone Encounter (Signed)
 Patient scheduled appointments. Patient is aware of all appointment details.

## 2023-06-09 ENCOUNTER — Inpatient Hospital Stay

## 2023-06-09 ENCOUNTER — Encounter: Payer: Self-pay | Admitting: Adult Health

## 2023-06-09 ENCOUNTER — Inpatient Hospital Stay (HOSPITAL_BASED_OUTPATIENT_CLINIC_OR_DEPARTMENT_OTHER): Admitting: Adult Health

## 2023-06-09 VITALS — BP 143/94 | HR 73 | Temp 97.9°F | Resp 16 | Ht 66.0 in | Wt 128.4 lb

## 2023-06-09 DIAGNOSIS — Z1721 Progesterone receptor positive status: Secondary | ICD-10-CM | POA: Diagnosis not present

## 2023-06-09 DIAGNOSIS — Z5112 Encounter for antineoplastic immunotherapy: Secondary | ICD-10-CM | POA: Diagnosis present

## 2023-06-09 DIAGNOSIS — Z171 Estrogen receptor negative status [ER-]: Secondary | ICD-10-CM | POA: Diagnosis not present

## 2023-06-09 DIAGNOSIS — F17210 Nicotine dependence, cigarettes, uncomplicated: Secondary | ICD-10-CM | POA: Diagnosis not present

## 2023-06-09 DIAGNOSIS — C50311 Malignant neoplasm of lower-inner quadrant of right female breast: Secondary | ICD-10-CM | POA: Diagnosis present

## 2023-06-09 DIAGNOSIS — Z1731 Human epidermal growth factor receptor 2 positive status: Secondary | ICD-10-CM | POA: Diagnosis not present

## 2023-06-09 DIAGNOSIS — C787 Secondary malignant neoplasm of liver and intrahepatic bile duct: Secondary | ICD-10-CM | POA: Diagnosis present

## 2023-06-09 DIAGNOSIS — C50911 Malignant neoplasm of unspecified site of right female breast: Secondary | ICD-10-CM

## 2023-06-09 MED ORDER — PERTUZUMAB CHEMO INJECTION 420 MG/14ML
420.0000 mg | Freq: Once | INTRAVENOUS | Status: AC
  Administered 2023-06-09: 420 mg via INTRAVENOUS
  Filled 2023-06-09: qty 14

## 2023-06-09 MED ORDER — HEPARIN SOD (PORK) LOCK FLUSH 100 UNIT/ML IV SOLN
500.0000 [IU] | Freq: Once | INTRAVENOUS | Status: AC | PRN
  Administered 2023-06-09: 500 [IU]

## 2023-06-09 MED ORDER — SODIUM CHLORIDE 0.9% FLUSH
10.0000 mL | INTRAVENOUS | Status: DC | PRN
  Administered 2023-06-09: 10 mL

## 2023-06-09 MED ORDER — TRASTUZUMAB-ANNS CHEMO 150 MG IV SOLR
6.0000 mg/kg | Freq: Once | INTRAVENOUS | Status: AC
  Administered 2023-06-09: 357 mg via INTRAVENOUS
  Filled 2023-06-09: qty 17

## 2023-06-09 MED ORDER — DIPHENHYDRAMINE HCL 25 MG PO CAPS
50.0000 mg | ORAL_CAPSULE | Freq: Once | ORAL | Status: AC
  Administered 2023-06-09: 50 mg via ORAL
  Filled 2023-06-09: qty 2

## 2023-06-09 MED ORDER — SODIUM CHLORIDE 0.9 % IV SOLN: INTRAVENOUS | Status: DC

## 2023-06-09 MED ORDER — ACETAMINOPHEN 325 MG PO TABS
650.0000 mg | ORAL_TABLET | Freq: Once | ORAL | Status: AC
  Administered 2023-06-09: 650 mg via ORAL
  Filled 2023-06-09: qty 2

## 2023-06-09 NOTE — Patient Instructions (Signed)
 [facility] - A DEPT OF [facility]  Discharge Instructions: Thank you for choosing [facility] to provide your oncology and hematology care.   If you have a lab appointment with the Cancer Center, please go directly to the Cancer Center and check in at the registration area.   Wear comfortable clothing and clothing appropriate for easy access to any Portacath or PICC line.   We strive to give you quality time with your provider. You may need to reschedule your appointment if you arrive late (15 or more minutes).  Arriving late affects you and other patients whose appointments are after yours.  Also, if you miss three or more appointments without notifying the office, you may be dismissed from the clinic at the provider's discretion.      For prescription refill requests, have your pharmacy contact our office and allow 72 hours for refills to be completed.    Today you received the following chemotherapy and/or immunotherapy agents: Trastuzumab, Pertuzumab      To help prevent nausea and vomiting after your treatment, we encourage you to take your nausea medication as directed.  BELOW ARE SYMPTOMS THAT SHOULD BE REPORTED IMMEDIATELY: *FEVER GREATER THAN 100.4 F (38 C) OR HIGHER *CHILLS OR SWEATING *NAUSEA AND VOMITING THAT IS NOT CONTROLLED WITH YOUR NAUSEA MEDICATION *UNUSUAL SHORTNESS OF BREATH *UNUSUAL BRUISING OR BLEEDING *URINARY PROBLEMS (pain or burning when urinating, or frequent urination) *BOWEL PROBLEMS (unusual diarrhea, constipation, pain near the anus) TENDERNESS IN MOUTH AND THROAT WITH OR WITHOUT PRESENCE OF ULCERS (sore throat, sores in mouth, or a toothache) UNUSUAL RASH, SWELLING OR PAIN  UNUSUAL VAGINAL DISCHARGE OR ITCHING   Items with * indicate a potential emergency and should be followed up as soon as possible or go to the Emergency Department if any problems should occur.  Please show the CHEMOTHERAPY ALERT CARD or  IMMUNOTHERAPY ALERT CARD at check-in to the Emergency Department and triage nurse.  Should you have questions after your visit or need to cancel or reschedule your appointment, please contact [facility] - A DEPT OF [facility]  Dept: [phone]  and follow the prompts.  Office hours are 8:00 a.m. to 4:30 p.m. Monday - Friday. Please note that voicemails left after 4:00 p.m. may not be returned until the following business day.  We are closed weekends and major holidays. You have access to a nurse at all times for urgent questions. Please call the main number to the clinic Dept: [phone] and follow the prompts.   For any non-urgent questions, you may also contact your provider using MyChart. We now offer e-Visits for anyone 34 and older to request care online for non-urgent symptoms. For details visit [email or web address].   Also download the MyChart app! Go to the app store, search "MyChart", open the app, select [place], and log in with your MyChart username and password.

## 2023-06-09 NOTE — Progress Notes (Signed)
Patient declined to stay for 30 minute post Perjeta observation.

## 2023-06-09 NOTE — Assessment & Plan Note (Signed)
 06/16/2020 stage IV: T2 N1 M1 ER/PR negative HER2 positive metastatic breast cancer (diagnosed and treated by Dr. Meredeth Stallion) 08/09/2020 -11/02/2020 6 cycles of THP, on Herceptin and Perjeta maintenance Right breast DCIS Current treatment: Herceptin Perjeta maintenance since 12/14/2020 (discontinued November 2023 after she left [facility])   CT CAP 03/18/2023: New dense consolidation right lower lobe, minimal spillover to right middle lobe favoring bacterial pneumonia versus pulmonary hemorrhage, substantial improvement in the right breast lesions and right axillary lymph nodes, left adrenal mass  2 cm: Benign  Hospitalization 03/18/2023-03/25/2023: Pneumococcal bacteremia and sepsis   Treatment plan: Herceptin and Perjeta Echocardiogram completed 01/20/2023: Echo by Dr. Julane Ny 01/20/23 EF 60-65%, GLS -16.9%  Echo 03/21/2023: 45 to 50% (Dr. Julane Ny reviewed it and felt it was more 50 to 55% and that he was fine for us  to proceed with her treatment)  She will proceed with Herceptin Perjeta today.  I reviewed the fact that she missed her echocardiogram appointment with Dr. Bensimhon.  Per Dr. Gudena she is okay to proceed with treatment today since she is not having any symptoms.  He recommended that she get her echocardiogram rescheduled.  We will see her back every 3 weeks for continued treatment.

## 2023-06-09 NOTE — Progress Notes (Signed)
 [facility] Cancer Follow up:    Default, Provider, MD No address on file   DIAGNOSIS:  Cancer Staging  No matching staging information was found for the patient.    SUMMARY OF ONCOLOGIC HISTORY: Oncology History  Breast cancer metastasized to multiple sites (HCC)  06/16/2020 Initial Diagnosis   [place]:  stage IV: T2 N1 M1 ER/PR negative HER2 positive metastatic breast cancer (diagnosed and treated by Dr. Welton Flakes) 08/09/2020 -11/02/2020 6 cycles of THP, on Herceptin and Perjeta maintenance Right breast DCIS   01/21/2023 -  Chemotherapy   Patient is on Treatment Plan : BREAST Trastuzumab + Pertuzumab q21d       CURRENT THERAPY: Herceptin Perjeta  INTERVAL HISTORY:  Connie Wells 38 y.o. female returns for follow-up prior to receiving Herceptin Perjeta.  Most recent echo was in January 2025.  It was reviewed by Dr. Gala Romney who felt like the EF likely was close to 50 to 55% instead of 45 to 50%.  Herceptin was resumed and she was due to repeat echocardiogram at the end of March however she missed that appointment.  She denies any symptoms such as chest pain, shortness of breath, cough, orthopnea, dyspnea on exertion, palpitations, swelling.     Patient Active Problem List   Diagnosis Date Noted   Pneumococcal bacteremia 03/24/2023   Sepsis due to pneumonia (HCC) 03/19/2023   AKI (acute kidney injury) (HCC) 03/19/2023   History of breast cancer 03/19/2023   GAD (generalized anxiety disorder) 03/19/2023   CAP (community acquired pneumonia) 03/19/2023   Breast cancer metastasized to multiple sites (HCC) 08/21/2022   Cellulitis of pharynx    Pharyngitis 04/20/2015   Postpartum care and examination 12/27/2013   Contraception management 12/27/2013   GBS bacteriuria 10/14/2013   Drug use complicating pregnancy in second trimester 07/06/2013   Hx of pyelonephritis 06/08/2013   HSV-2 infection complicating pregnancy 06/08/2013   SOLITARY KIDNEY 05/28/2006    ANA POSITIVE, HX OF 05/02/2006    has no known allergies.  MEDICAL HISTORY: Past Medical History:  Diagnosis Date   Herpes genitalia    Solitary kidney     SURGICAL HISTORY: Past Surgical History:  Procedure Laterality Date   DILATION AND EVACUATION     ECTOPIC PREGNANCY SURGERY     TRANSESOPHAGEAL ECHOCARDIOGRAM (CATH LAB) N/A 03/25/2023   Procedure: TRANSESOPHAGEAL ECHOCARDIOGRAM;  Surgeon: Chilton Si, MD;  Location: [facility] INVASIVE CV LAB;  Service: Cardiovascular;  Laterality: N/A;    SOCIAL HISTORY: Social History   Socioeconomic History   Marital status: Single    Spouse name: Not on file   Number of children: Not on file   Years of education: Not on file   Highest education level: Not on file  Occupational History   Not on file  Tobacco Use   Smoking status: Every Day    Current packs/day: 0.50    Average packs/day: 0.5 packs/day for 5.0 years (2.5 ttl pk-yrs)    Types: Cigarettes   Smokeless tobacco: Never  Substance and Sexual Activity   Alcohol use: No    Comment: Occassional   Drug use: Yes    Types: Marijuana   Sexual activity: Yes    Birth control/protection: None  Other Topics Concern   Not on file  Social History Narrative   Not on file   Social Drivers of Health   Financial Resource Strain: Not on file  Food Insecurity: No Food Insecurity (03/20/2023)   Hunger Vital Sign    Worried About Running  Out of Food in the Last Year: Never true    Ran Out of Food in the Last Year: Never true  Recent Concern: Food Insecurity - Food Insecurity Present (01/21/2023)   Hunger Vital Sign    Worried About Running Out of Food in the Last Year: Sometimes true    Ran Out of Food in the Last Year: Sometimes true  Transportation Needs: No Transportation Needs (03/19/2023)   PRAPARE - Administrator, Civil Service (Medical): No    Lack of Transportation (Non-Medical): No  Recent Concern: Transportation Needs - Unmet Transportation Needs  (01/21/2023)   PRAPARE - Administrator, Civil Service (Medical): Yes    Lack of Transportation (Non-Medical): No  Physical Activity: Not on file  Stress: Not on file  Social Connections: Unknown (03/19/2023)   Social Connection and Isolation Panel [NHANES]    Frequency of Communication with Friends and Family: More than three times a week    Frequency of Social Gatherings with Friends and Family: More than three times a week    Attends Religious Services: Patient declined    Database administrator or Organizations: Yes    Attends Banker Meetings: Patient declined    Marital Status: Not on file  Intimate Partner Violence: Not At Risk (03/19/2023)   Humiliation, Afraid, Rape, and Kick questionnaire    Fear of Current or Ex-Partner: No    Emotionally Abused: No    Physically Abused: No    Sexually Abused: No    FAMILY HISTORY: Family History  Problem Relation Age of Onset   Diabetes Mother    Hypertension Mother    Diabetes Maternal Aunt    Asthma Brother     Review of Systems  Constitutional:  Negative for appetite change, chills, fatigue, fever and unexpected weight change.  HENT:   Negative for hearing loss, lump/mass and trouble swallowing.   Eyes:  Negative for eye problems and icterus.  Respiratory:  Negative for chest tightness, cough and shortness of breath.   Cardiovascular:  Negative for chest pain, leg swelling and palpitations.  Gastrointestinal:  Negative for abdominal distention, abdominal pain, constipation, diarrhea, nausea and vomiting.  Endocrine: Negative for hot flashes.  Genitourinary:  Negative for difficulty urinating.   Musculoskeletal:  Negative for arthralgias.  Skin:  Negative for itching and rash.  Neurological:  Negative for dizziness, extremity weakness, headaches and numbness.  Hematological:  Negative for adenopathy. Does not bruise/bleed easily.  Psychiatric/Behavioral:  Negative for depression. The patient is not  nervous/anxious.       PHYSICAL EXAMINATION    Vitals:   06/09/23 1412  BP: (!) 143/94  Pulse: 73  Resp: 16  Temp: 97.9 F (36.6 C)  SpO2: 99%    Physical Exam Constitutional:      General: She is not in acute distress.    Appearance: Normal appearance. She is not toxic-appearing.  HENT:     Head: Normocephalic and atraumatic.     Mouth/Throat:     Mouth: Mucous membranes are moist.     Pharynx: Oropharynx is clear. No oropharyngeal exudate or posterior oropharyngeal erythema.  Eyes:     General: No scleral icterus. Cardiovascular:     Rate and Rhythm: Normal rate and regular rhythm.     Pulses: Normal pulses.     Heart sounds: Normal heart sounds.  Pulmonary:     Effort: Pulmonary effort is normal.     Breath sounds: Normal breath sounds.  Abdominal:  General: Abdomen is flat. Bowel sounds are normal. There is no distension.     Palpations: Abdomen is soft.     Tenderness: There is no abdominal tenderness.     Hernia: A hernia (Umbilical reducible hernia) is present.  Musculoskeletal:        General: No swelling.     Cervical back: Neck supple.  Lymphadenopathy:     Cervical: No cervical adenopathy.  Skin:    General: Skin is warm and dry.     Findings: No rash.  Neurological:     General: No focal deficit present.     Mental Status: She is alert.  Psychiatric:        Mood and Affect: Mood normal.        Behavior: Behavior normal.       ASSESSMENT and THERAPY PLAN:   Breast cancer metastasized to multiple sites (HCC) 06/16/2020 stage IV: T2 N1 M1 ER/PR negative HER2 positive metastatic breast cancer (diagnosed and treated by Dr. Meredeth Stallion) 08/09/2020 -11/02/2020 6 cycles of THP, on Herceptin and Perjeta maintenance Right breast DCIS Current treatment: Herceptin Perjeta maintenance since 12/14/2020 (discontinued November 2023 after she left Dr. Towner Cid clinic)   CT CAP 03/18/2023: New dense consolidation right lower lobe, minimal spillover to right middle  lobe favoring bacterial pneumonia versus pulmonary hemorrhage, substantial improvement in the right breast lesions and right axillary lymph nodes, left adrenal mass  2 cm: Benign  Hospitalization 03/18/2023-03/25/2023: Pneumococcal bacteremia and sepsis   Treatment plan: Herceptin and Perjeta Echocardiogram completed 01/20/2023: Echo by Dr. Julane Ny 01/20/23 EF 60-65%, GLS -16.9%  Echo 03/21/2023: 45 to 50% (Dr. Julane Ny reviewed it and felt it was more 50 to 55% and that he was fine for us  to proceed with her treatment)  She will proceed with Herceptin Perjeta today.  I reviewed the fact that she missed her echocardiogram appointment with Dr. Bensimhon.  Per Dr. Gudena she is okay to proceed with treatment today since she is not having any symptoms.  He recommended that she get her echocardiogram rescheduled.  We will see her back every 3 weeks for continued treatment.     All questions were answered. The patient knows to call the clinic with any problems, questions or concerns. We can certainly see the patient much sooner if necessary.  Total encounter time:15 minutes*in face-to-face visit time, chart review, lab review, care coordination, order entry, and documentation of the encounter time.    Alwin Baars, NP 06/09/23 3:40 PM Medical Oncology and Hematology [facility] [address] [place], [place] [address] Tel. [phone]    Fax. [phone]  *Total Encounter Time as defined by the Centers for Medicare and Medicaid Services includes, in addition to the face-to-face time of a patient visit (documented in the note above) non-face-to-face time: obtaining and reviewing outside history, ordering and reviewing medications, tests or procedures, care coordination (communications with other health care professionals or caregivers) and documentation in the medical record.

## 2023-06-10 ENCOUNTER — Other Ambulatory Visit: Payer: Self-pay

## 2023-06-18 ENCOUNTER — Ambulatory Visit

## 2023-06-26 ENCOUNTER — Ambulatory Visit

## 2023-06-30 ENCOUNTER — Telehealth: Payer: Self-pay

## 2023-06-30 ENCOUNTER — Inpatient Hospital Stay: Attending: Hematology and Oncology

## 2023-06-30 DIAGNOSIS — Z171 Estrogen receptor negative status [ER-]: Secondary | ICD-10-CM | POA: Insufficient documentation

## 2023-06-30 DIAGNOSIS — Z1722 Progesterone receptor negative status: Secondary | ICD-10-CM | POA: Insufficient documentation

## 2023-06-30 DIAGNOSIS — Z5112 Encounter for antineoplastic immunotherapy: Secondary | ICD-10-CM | POA: Insufficient documentation

## 2023-06-30 DIAGNOSIS — Z1732 Human epidermal growth factor receptor 2 negative status: Secondary | ICD-10-CM | POA: Insufficient documentation

## 2023-06-30 DIAGNOSIS — F17210 Nicotine dependence, cigarettes, uncomplicated: Secondary | ICD-10-CM | POA: Insufficient documentation

## 2023-06-30 DIAGNOSIS — Z79899 Other long term (current) drug therapy: Secondary | ICD-10-CM | POA: Insufficient documentation

## 2023-06-30 DIAGNOSIS — C50311 Malignant neoplasm of lower-inner quadrant of right female breast: Secondary | ICD-10-CM | POA: Insufficient documentation

## 2023-06-30 DIAGNOSIS — C787 Secondary malignant neoplasm of liver and intrahepatic bile duct: Secondary | ICD-10-CM | POA: Insufficient documentation

## 2023-06-30 NOTE — Telephone Encounter (Signed)
 I called patient  twice to let them know they were late for their infusion appt.  Each time I called the phone call would ring for awhile then it would say that call could not be completed at this time and to try again later. Patients  appointment was no showed for the day.

## 2023-07-08 ENCOUNTER — Inpatient Hospital Stay

## 2023-07-08 ENCOUNTER — Other Ambulatory Visit: Payer: Self-pay | Admitting: Adult Health

## 2023-07-08 VITALS — BP 138/94 | HR 83 | Temp 98.5°F | Resp 18 | Wt 128.2 lb

## 2023-07-08 DIAGNOSIS — F17210 Nicotine dependence, cigarettes, uncomplicated: Secondary | ICD-10-CM | POA: Diagnosis not present

## 2023-07-08 DIAGNOSIS — C787 Secondary malignant neoplasm of liver and intrahepatic bile duct: Secondary | ICD-10-CM | POA: Diagnosis present

## 2023-07-08 DIAGNOSIS — Z5112 Encounter for antineoplastic immunotherapy: Secondary | ICD-10-CM | POA: Diagnosis present

## 2023-07-08 DIAGNOSIS — Z1732 Human epidermal growth factor receptor 2 negative status: Secondary | ICD-10-CM | POA: Diagnosis not present

## 2023-07-08 DIAGNOSIS — C50311 Malignant neoplasm of lower-inner quadrant of right female breast: Secondary | ICD-10-CM | POA: Diagnosis present

## 2023-07-08 DIAGNOSIS — Z171 Estrogen receptor negative status [ER-]: Secondary | ICD-10-CM | POA: Diagnosis not present

## 2023-07-08 DIAGNOSIS — Z79899 Other long term (current) drug therapy: Secondary | ICD-10-CM | POA: Diagnosis not present

## 2023-07-08 DIAGNOSIS — C50911 Malignant neoplasm of unspecified site of right female breast: Secondary | ICD-10-CM

## 2023-07-08 DIAGNOSIS — Z1722 Progesterone receptor negative status: Secondary | ICD-10-CM | POA: Diagnosis not present

## 2023-07-08 MED ORDER — SODIUM CHLORIDE 0.9 % IV SOLN
420.0000 mg | Freq: Once | INTRAVENOUS | Status: AC
  Administered 2023-07-08: 420 mg via INTRAVENOUS
  Filled 2023-07-08: qty 14

## 2023-07-08 MED ORDER — TRASTUZUMAB-ANNS CHEMO 150 MG IV SOLR
6.0000 mg/kg | Freq: Once | INTRAVENOUS | Status: AC
  Administered 2023-07-08: 357 mg via INTRAVENOUS
  Filled 2023-07-08: qty 17

## 2023-07-08 MED ORDER — SODIUM CHLORIDE 0.9% FLUSH
10.0000 mL | INTRAVENOUS | Status: DC | PRN
  Administered 2023-07-08: 10 mL

## 2023-07-08 MED ORDER — ACETAMINOPHEN 325 MG PO TABS
650.0000 mg | ORAL_TABLET | Freq: Once | ORAL | Status: AC
  Administered 2023-07-08: 650 mg via ORAL
  Filled 2023-07-08: qty 2

## 2023-07-08 MED ORDER — SODIUM CHLORIDE 0.9 % IV SOLN: INTRAVENOUS | Status: DC

## 2023-07-08 MED ORDER — DIPHENHYDRAMINE HCL 25 MG PO CAPS
50.0000 mg | ORAL_CAPSULE | Freq: Once | ORAL | Status: AC
  Administered 2023-07-08: 50 mg via ORAL
  Filled 2023-07-08: qty 2

## 2023-07-08 MED ORDER — HEPARIN SOD (PORK) LOCK FLUSH 100 UNIT/ML IV SOLN
500.0000 [IU] | Freq: Once | INTRAVENOUS | Status: AC | PRN
  Administered 2023-07-08: 500 [IU]

## 2023-07-08 NOTE — Patient Instructions (Signed)
 [facility] - A DEPT OF [facility]  Discharge Instructions: Thank you for choosing [facility] to provide your oncology and hematology care.   If you have a lab appointment with the Cancer Center, please go directly to the Cancer Center and check in at the registration area.   Wear comfortable clothing and clothing appropriate for easy access to any Portacath or PICC line.   We strive to give you quality time with your provider. You may need to reschedule your appointment if you arrive late (15 or more minutes).  Arriving late affects you and other patients whose appointments are after yours.  Also, if you miss three or more appointments without notifying the office, you may be dismissed from the clinic at the provider's discretion.      For prescription refill requests, have your pharmacy contact our office and allow 72 hours for refills to be completed.    Today you received the following chemotherapy and/or immunotherapy agents: Trastuzumab, Pertuzumab      To help prevent nausea and vomiting after your treatment, we encourage you to take your nausea medication as directed.  BELOW ARE SYMPTOMS THAT SHOULD BE REPORTED IMMEDIATELY: *FEVER GREATER THAN 100.4 F (38 C) OR HIGHER *CHILLS OR SWEATING *NAUSEA AND VOMITING THAT IS NOT CONTROLLED WITH YOUR NAUSEA MEDICATION *UNUSUAL SHORTNESS OF BREATH *UNUSUAL BRUISING OR BLEEDING *URINARY PROBLEMS (pain or burning when urinating, or frequent urination) *BOWEL PROBLEMS (unusual diarrhea, constipation, pain near the anus) TENDERNESS IN MOUTH AND THROAT WITH OR WITHOUT PRESENCE OF ULCERS (sore throat, sores in mouth, or a toothache) UNUSUAL RASH, SWELLING OR PAIN  UNUSUAL VAGINAL DISCHARGE OR ITCHING   Items with * indicate a potential emergency and should be followed up as soon as possible or go to the Emergency Department if any problems should occur.  Please show the CHEMOTHERAPY ALERT CARD or  IMMUNOTHERAPY ALERT CARD at check-in to the Emergency Department and triage nurse.  Should you have questions after your visit or need to cancel or reschedule your appointment, please contact [facility] - A DEPT OF [facility]  Dept: [phone]  and follow the prompts.  Office hours are 8:00 a.m. to 4:30 p.m. Monday - Friday. Please note that voicemails left after 4:00 p.m. may not be returned until the following business day.  We are closed weekends and major holidays. You have access to a nurse at all times for urgent questions. Please call the main number to the clinic Dept: [phone] and follow the prompts.   For any non-urgent questions, you may also contact your provider using MyChart. We now offer e-Visits for anyone 34 and older to request care online for non-urgent symptoms. For details visit [email or web address].   Also download the MyChart app! Go to the app store, search "MyChart", open the app, select [place], and log in with your MyChart username and password.

## 2023-07-08 NOTE — Progress Notes (Signed)
 Patient notified of new appointment with Dr. Julane Ny on May 30 at 1040.  Patient verbalized understanding.  Patient declined post-pertuzumab  observation.  Tolerated treatment well without incident.  VSS at discharge.  Ambulated to lobby.

## 2023-07-08 NOTE — Progress Notes (Signed)
 Per Dr. Gudena, OK to treat today with echocardiogram from January, 2025.

## 2023-07-09 ENCOUNTER — Ambulatory Visit

## 2023-07-09 ENCOUNTER — Ambulatory Visit: Admitting: Hematology and Oncology

## 2023-07-09 ENCOUNTER — Other Ambulatory Visit: Payer: Self-pay

## 2023-07-17 ENCOUNTER — Ambulatory Visit

## 2023-07-17 ENCOUNTER — Ambulatory Visit: Admitting: Hematology and Oncology

## 2023-07-22 ENCOUNTER — Ambulatory Visit

## 2023-07-22 ENCOUNTER — Ambulatory Visit: Admitting: Hematology and Oncology

## 2023-07-24 ENCOUNTER — Telehealth (HOSPITAL_COMMUNITY): Payer: Self-pay | Admitting: Internal Medicine

## 2023-07-24 NOTE — Telephone Encounter (Signed)
 Called to confirm/remind patient of their appointment at the [facility] on 07/24/2023.   Appointment:   [] Confirmed  [x] Left mess   [] No answer/No voice mail  [] VM Full/unable to leave message  [] Phone not in service  Patient reminded to bring all medications and/or complete list.  Confirmed patient has transportation. Gave directions, instructed to utilize valet parking.

## 2023-07-25 ENCOUNTER — Encounter (HOSPITAL_COMMUNITY): Admitting: Internal Medicine

## 2023-07-29 ENCOUNTER — Inpatient Hospital Stay: Attending: Hematology and Oncology | Admitting: Hematology and Oncology

## 2023-07-29 ENCOUNTER — Ambulatory Visit

## 2023-07-29 ENCOUNTER — Ambulatory Visit: Admitting: Hematology and Oncology

## 2023-07-29 ENCOUNTER — Inpatient Hospital Stay

## 2023-07-29 ENCOUNTER — Other Ambulatory Visit: Payer: Self-pay

## 2023-07-29 VITALS — BP 125/83 | HR 86 | Temp 97.8°F

## 2023-07-29 VITALS — BP 138/98 | HR 105 | Temp 98.7°F | Resp 16 | Wt 127.5 lb

## 2023-07-29 DIAGNOSIS — Z1731 Human epidermal growth factor receptor 2 positive status: Secondary | ICD-10-CM | POA: Diagnosis not present

## 2023-07-29 DIAGNOSIS — C50911 Malignant neoplasm of unspecified site of right female breast: Secondary | ICD-10-CM

## 2023-07-29 DIAGNOSIS — C787 Secondary malignant neoplasm of liver and intrahepatic bile duct: Secondary | ICD-10-CM | POA: Insufficient documentation

## 2023-07-29 DIAGNOSIS — Z5112 Encounter for antineoplastic immunotherapy: Secondary | ICD-10-CM | POA: Diagnosis present

## 2023-07-29 DIAGNOSIS — Z171 Estrogen receptor negative status [ER-]: Secondary | ICD-10-CM | POA: Diagnosis not present

## 2023-07-29 DIAGNOSIS — Z1722 Progesterone receptor negative status: Secondary | ICD-10-CM | POA: Diagnosis not present

## 2023-07-29 DIAGNOSIS — C50311 Malignant neoplasm of lower-inner quadrant of right female breast: Secondary | ICD-10-CM | POA: Diagnosis present

## 2023-07-29 DIAGNOSIS — C7951 Secondary malignant neoplasm of bone: Secondary | ICD-10-CM | POA: Diagnosis not present

## 2023-07-29 DIAGNOSIS — Z79899 Other long term (current) drug therapy: Secondary | ICD-10-CM | POA: Insufficient documentation

## 2023-07-29 MED ORDER — ACETAMINOPHEN 325 MG PO TABS
650.0000 mg | ORAL_TABLET | Freq: Once | ORAL | Status: AC
  Administered 2023-07-29: 650 mg via ORAL
  Filled 2023-07-29: qty 2

## 2023-07-29 MED ORDER — SODIUM CHLORIDE 0.9 % IV SOLN
420.0000 mg | Freq: Once | INTRAVENOUS | Status: AC
  Administered 2023-07-29: 420 mg via INTRAVENOUS
  Filled 2023-07-29: qty 14

## 2023-07-29 MED ORDER — HEPARIN SOD (PORK) LOCK FLUSH 100 UNIT/ML IV SOLN
500.0000 [IU] | Freq: Once | INTRAVENOUS | Status: AC | PRN
  Administered 2023-07-29: 500 [IU]

## 2023-07-29 MED ORDER — SODIUM CHLORIDE 0.9% FLUSH
10.0000 mL | INTRAVENOUS | Status: DC | PRN
  Administered 2023-07-29: 10 mL

## 2023-07-29 MED ORDER — DIPHENHYDRAMINE HCL 25 MG PO CAPS
50.0000 mg | ORAL_CAPSULE | Freq: Once | ORAL | Status: AC
  Administered 2023-07-29: 50 mg via ORAL
  Filled 2023-07-29: qty 2

## 2023-07-29 MED ORDER — SODIUM CHLORIDE 0.9 % IV SOLN: INTRAVENOUS | Status: DC

## 2023-07-29 MED ORDER — TRASTUZUMAB-ANNS CHEMO 150 MG IV SOLR
6.0000 mg/kg | Freq: Once | INTRAVENOUS | Status: AC
  Administered 2023-07-29: 357 mg via INTRAVENOUS
  Filled 2023-07-29: qty 17

## 2023-07-29 NOTE — Assessment & Plan Note (Signed)
 06/16/2020 stage IV: T2 N1 M1 ER/PR negative HER2 positive metastatic breast cancer (diagnosed and treated by Dr. Meredeth Stallion) 08/09/2020 -11/02/2020 6 cycles of THP, on Herceptin  and Perjeta  maintenance Right breast DCIS Current treatment: Herceptin  Perjeta  maintenance since 12/14/2020 (discontinued November 2023 after she left Dr.  Cid clinic)   CT CAP 03/18/2023: New dense consolidation right lower lobe, minimal spillover to right middle lobe favoring bacterial pneumonia versus pulmonary hemorrhage, substantial improvement in the right breast lesions and right axillary lymph nodes, left adrenal mass  2 cm: Benign   Hospitalization 03/18/2023-03/25/2023: Pneumococcal bacteremia and sepsis   Treatment plan: Resumed Herceptin  and Perjeta  Echocardiogram completed 01/20/2023: Echo by Dr. Julane Ny 01/20/23 EF 60-65%, GLS -16.9%  Echo 03/21/2023: 45 to 50% (Dr. Julane Ny reviewed it and felt it was more 50 to 55% and that he was fine for us  to proceed with her treatment)    Return to clinic every 3 weeks for Herceptin  Perjeta  and every 6 weeks for follow-up with me with scans.

## 2023-07-29 NOTE — Patient Instructions (Signed)
 [facility] - A DEPT OF [facility]  Discharge Instructions: Thank you for choosing [facility] to provide your oncology and hematology care.   If you have a lab appointment with the Cancer Center, please go directly to the Cancer Center and check in at the registration area.   Wear comfortable clothing and clothing appropriate for easy access to any Portacath or PICC line.   We strive to give you quality time with your provider. You may need to reschedule your appointment if you arrive late (15 or more minutes).  Arriving late affects you and other patients whose appointments are after yours.  Also, if you miss three or more appointments without notifying the office, you may be dismissed from the clinic at the provider's discretion.      For prescription refill requests, have your pharmacy contact our office and allow 72 hours for refills to be completed.    Today you received the following chemotherapy and/or immunotherapy agents: trastuzumab & pertuzumab      To help prevent nausea and vomiting after your treatment, we encourage you to take your nausea medication as directed.  BELOW ARE SYMPTOMS THAT SHOULD BE REPORTED IMMEDIATELY: *FEVER GREATER THAN 100.4 F (38 C) OR HIGHER *CHILLS OR SWEATING *NAUSEA AND VOMITING THAT IS NOT CONTROLLED WITH YOUR NAUSEA MEDICATION *UNUSUAL SHORTNESS OF BREATH *UNUSUAL BRUISING OR BLEEDING *URINARY PROBLEMS (pain or burning when urinating, or frequent urination) *BOWEL PROBLEMS (unusual diarrhea, constipation, pain near the anus) TENDERNESS IN MOUTH AND THROAT WITH OR WITHOUT PRESENCE OF ULCERS (sore throat, sores in mouth, or a toothache) UNUSUAL RASH, SWELLING OR PAIN  UNUSUAL VAGINAL DISCHARGE OR ITCHING   Items with * indicate a potential emergency and should be followed up as soon as possible or go to the Emergency Department if any problems should occur.  Please show the CHEMOTHERAPY ALERT CARD or  IMMUNOTHERAPY ALERT CARD at check-in to the Emergency Department and triage nurse.  Should you have questions after your visit or need to cancel or reschedule your appointment, please contact [facility] - A DEPT OF [facility]  Dept: [phone]  and follow the prompts.  Office hours are 8:00 a.m. to 4:30 p.m. Monday - Friday. Please note that voicemails left after 4:00 p.m. may not be returned until the following business day.  We are closed weekends and major holidays. You have access to a nurse at all times for urgent questions. Please call the main number to the clinic Dept: [phone] and follow the prompts.   For any non-urgent questions, you may also contact your provider using MyChart. We now offer e-Visits for anyone 67 and older to request care online for non-urgent symptoms. For details visit [email or web address].   Also download the MyChart app! Go to the app store, search "MyChart", open the app, select [place], and log in with your MyChart username and password.

## 2023-07-29 NOTE — Progress Notes (Signed)
 Patient Care Team: Default, Provider, MD as PCP - General  DIAGNOSIS:  Encounter Diagnosis  Name Primary?   Carcinoma of right breast metastatic to multiple sites [facility]) Yes    SUMMARY OF ONCOLOGIC HISTORY: Oncology History  Breast cancer metastasized to multiple sites (HCC)  06/16/2020 Initial Diagnosis   [place]:  stage IV: T2 N1 M1 ER/PR negative HER2 positive metastatic breast cancer (diagnosed and treated by Dr. Meredeth Stallion) 08/09/2020 -11/02/2020 6 cycles of THP, on Herceptin  and Perjeta  maintenance Right breast DCIS   01/21/2023 -  Chemotherapy   Patient is on Treatment Plan : BREAST Trastuzumab  + Pertuzumab  q21d       CHIEF COMPLIANT: Follow-up on Herceptin  and Perjeta   HISTORY OF PRESENT ILLNESS:  History of Present Illness Connie Wells is a 38 year old female who presents for follow-up and treatment management.  She is scheduled for an infusion on June 24th and plans a whole body scan around June 17th. Her daughter's ENT appointment on June 17th may affect scheduling. Updated labs are required prior to the CT scan, and she has a port for blood draw access. She experienced pneumonia in January with breathing difficulties and sharp chest pain but has had no recent respiratory issues.     ALLERGIES:  has no known allergies.  MEDICATIONS:  Current Outpatient Medications  Medication Sig Dispense Refill   albuterol  (VENTOLIN  HFA) 108 (90 Base) MCG/ACT inhaler Inhale 2 puffs into the lungs every 6 (six) hours as needed for wheezing or shortness of breath. (Patient not taking: Reported on 04/30/2023) 18 g 2   carvedilol  (COREG ) 3.125 MG tablet Take 1 tablet (3.125 mg total) by mouth 2 (two) times daily with a meal. (Patient not taking: Reported on 07/08/2023) 60 tablet 1   folic acid  (FOLVITE ) 1 MG tablet Take 1 tablet (1 mg total) by mouth daily. (Patient not taking: Reported on 04/30/2023) 30 tablet 0   guaiFENesin  (MUCINEX ) 600 MG 12 hr tablet Take 2 tablets (1,200 mg  total) by mouth 2 (two) times daily. (Patient not taking: Reported on 04/30/2023) 120 tablet 0   ibuprofen  (ADVIL ) 200 MG tablet Take 200 mg by mouth every 6 (six) hours as needed. (Patient not taking: Reported on 04/16/2023)     losartan  (COZAAR ) 25 MG tablet Take 1 tablet (25 mg total) by mouth daily. (Patient not taking: Reported on 07/08/2023) 30 tablet 0   No current facility-administered medications for this visit.    PHYSICAL EXAMINATION: ECOG PERFORMANCE STATUS: 1 - Symptomatic but completely ambulatory  Vitals:   07/29/23 1307 07/29/23 1308  BP: (!) 153/101 (!) 138/98  Pulse: (!) 105   Resp: 16   Temp: 98.7 F (37.1 C)   SpO2: 97%    Filed Weights   07/29/23 1307  Weight: 127 lb 8 oz (57.8 kg)     LABORATORY DATA:  I have reviewed the data as listed    Latest Ref Rng & Units 04/16/2023   11:23 AM 03/23/2023   12:20 PM 03/22/2023    5:00 AM  CMP  Glucose 70 - 99 mg/dL 87  95  101   BUN 6 - 20 mg/dL 9  9  9    Creatinine 0.44 - 1.00 mg/dL 7.51  0.25  8.52   Sodium 135 - 145 mmol/L 135  136  138   Potassium 3.5 - 5.1 mmol/L 3.6  4.4  4.6   Chloride 98 - 111 mmol/L 106  104  109   CO2 22 - 32  mmol/L 27  27  21    Calcium  8.9 - 10.3 mg/dL 8.4  8.4  8.0   Total Protein 6.5 - 8.1 g/dL 7.1     Total Bilirubin 0.0 - 1.2 mg/dL 0.3     Alkaline Phos 38 - 126 U/L 41     AST 15 - 41 U/L 12     ALT 0 - 44 U/L 6       Lab Results  Component Value Date   WBC 7.6 04/16/2023   HGB 10.2 (L) 04/16/2023   HCT 30.9 (L) 04/16/2023   MCV 97.5 04/16/2023   PLT 236 04/16/2023   NEUTROABS 4.2 04/16/2023    ASSESSMENT & PLAN:  Breast cancer metastasized to multiple sites (HCC) 06/16/2020 stage IV: T2 N1 M1 ER/PR negative HER2 positive metastatic breast cancer (diagnosed and treated by Dr. Meredeth Stallion) 08/09/2020 -11/02/2020 6 cycles of THP, on Herceptin  and Perjeta  maintenance Right breast DCIS Current treatment: Herceptin  Perjeta  maintenance since 12/14/2020 (discontinued November 2023 after  she left Dr. [place] Cid clinic) resumed 04/16/2023   CT CAP 03/18/2023: New dense consolidation right lower lobe, minimal spillover to right middle lobe favoring bacterial pneumonia versus pulmonary hemorrhage, substantial improvement in the right breast lesions and right axillary lymph nodes, left adrenal mass  2 cm: Benign   Hospitalization 03/18/2023-03/25/2023: Pneumococcal bacteremia and sepsis   Treatment plan: Resumed Herceptin  and Perjeta  Echocardiogram completed 01/20/2023: Echo by Dr. Julane Ny 01/20/23 EF 60-65%, GLS -16.9%  Echo 03/21/2023: 45 to 50% (Dr. Julane Ny reviewed it and felt it was more 50 to 55% and that he was fine for us  to proceed with her treatment) Okay to treat today without a recent echocardiogram.  She will be calling Dr. Julane Ny make an appointment  Return to clinic every 3 weeks for Herceptin  Perjeta  and for follow-up with me with scans.   Orders Placed This Encounter  Procedures   CT CHEST ABDOMEN PELVIS W CONTRAST    Standing Status:   Future    Expected Date:   08/13/2023    Expiration Date:   07/28/2024    If indicated for the ordered procedure, I authorize the administration of contrast media per Radiology protocol:   Yes    Does the patient have a contrast media/X-ray dye allergy?:   No    Is patient pregnant?:   No    Preferred imaging location?:   [facility]    Release to patient:   Immediate    If indicated for the ordered procedure, I authorize the administration of oral contrast media per Radiology protocol:   Yes   CBC with Differential (Cancer Center Only)    Standing Status:   Future    Expiration Date:   07/28/2024   CMP (Cancer Center only)    Standing Status:   Future    Expiration Date:   07/28/2024   The patient has a good understanding of the overall plan. she agrees with it. she will call with any problems that may develop before the next visit here. Total time spent: 30 mins including face to face time and time spent for planning,  charting and co-ordination of care   Viinay K Sya Nestler, MD 07/29/23

## 2023-08-05 ENCOUNTER — Telehealth (HOSPITAL_COMMUNITY): Payer: Self-pay

## 2023-08-05 NOTE — Telephone Encounter (Signed)
 Called to confirm/remind patient of their appointment at the [facility] on 08/06/2023.   Appointment:   [] Confirmed  [x] Left mess   [] No answer/No voice mail  [] VM Full/unable to leave message  [] Phone not in service  Patient reminded to bring all medications and/or complete list.  Confirmed patient has transportation. Gave directions, instructed to utilize valet parking.

## 2023-08-06 ENCOUNTER — Ambulatory Visit (HOSPITAL_COMMUNITY)
Admission: RE | Admit: 2023-08-06 | Discharge: 2023-08-06 | Disposition: A | Discharge status: Home / Self Care | Source: Ambulatory Visit | Attending: Physician Assistant | Admitting: Physician Assistant

## 2023-08-06 ENCOUNTER — Encounter (HOSPITAL_COMMUNITY): Payer: Self-pay

## 2023-08-06 VITALS — BP 142/80 | HR 105 | Ht 66.0 in | Wt 125.8 lb

## 2023-08-06 DIAGNOSIS — F17210 Nicotine dependence, cigarettes, uncomplicated: Secondary | ICD-10-CM | POA: Insufficient documentation

## 2023-08-06 DIAGNOSIS — T465X6A Underdosing of other antihypertensive drugs, initial encounter: Secondary | ICD-10-CM | POA: Diagnosis not present

## 2023-08-06 DIAGNOSIS — I427 Cardiomyopathy due to drug and external agent: Secondary | ICD-10-CM | POA: Insufficient documentation

## 2023-08-06 DIAGNOSIS — I10 Essential (primary) hypertension: Secondary | ICD-10-CM | POA: Diagnosis present

## 2023-08-06 DIAGNOSIS — Z1722 Progesterone receptor negative status: Secondary | ICD-10-CM | POA: Diagnosis not present

## 2023-08-06 DIAGNOSIS — Q600 Renal agenesis, unilateral: Secondary | ICD-10-CM | POA: Insufficient documentation

## 2023-08-06 DIAGNOSIS — T451X5A Adverse effect of antineoplastic and immunosuppressive drugs, initial encounter: Secondary | ICD-10-CM | POA: Diagnosis not present

## 2023-08-06 DIAGNOSIS — Z853 Personal history of malignant neoplasm of breast: Secondary | ICD-10-CM | POA: Insufficient documentation

## 2023-08-06 DIAGNOSIS — Z79899 Other long term (current) drug therapy: Secondary | ICD-10-CM | POA: Diagnosis not present

## 2023-08-06 MED ORDER — LOSARTAN POTASSIUM 25 MG PO TABS: 25.0000 mg | ORAL_TABLET | Freq: Every day | ORAL | 3 refills | Status: DC

## 2023-08-06 MED ORDER — METOPROLOL SUCCINATE ER 25 MG PO TB24: 25.0000 mg | ORAL_TABLET | Freq: Every day | ORAL | 3 refills | Status: DC

## 2023-08-06 NOTE — Patient Instructions (Signed)
 START Toprol  XL 25 mg daily.  RESTART Losartan  25 mg daily.  Your physician has requested that you have an echocardiogram. Echocardiography is a painless test that uses sound waves to create images of your heart. It provides your doctor with information about the size and shape of your heart and how well your heart's chambers and valves are working. This procedure takes approximately one hour. There are no restrictions for this procedure. Please do NOT wear cologne, perfume, aftershave, or lotions (deodorant is allowed). Please arrive 15 minutes prior to your appointment time.  Please note: We ask at that you not bring children with you during ultrasound (echo/ vascular) testing. Due to room size and safety concerns, children are not allowed in the ultrasound rooms during exams. Our front office staff cannot provide observation of children in our lobby area while testing is being conducted. An adult accompanying a patient to their appointment will only be allowed in the ultrasound room at the discretion of the ultrasound technician under special circumstances. We apologize for any inconvenience.  Your physician recommends that you schedule a follow-up appointment in: 4 months.  If you have any questions or concerns before your next appointment please send us  a message through [place] or call our office at [phone].    TO LEAVE A MESSAGE FOR THE NURSE SELECT OPTION 2, PLEASE LEAVE A MESSAGE INCLUDING: YOUR NAME DATE OF BIRTH CALL BACK NUMBER REASON FOR CALL**this is important as we prioritize the call backs  YOU WILL RECEIVE A CALL BACK THE SAME DAY AS LONG AS YOU CALL BEFORE 4:00 PM  At the [facility], you and your health needs are our priority. As part of our continuing mission to provide you with exceptional heart care, we have created designated Provider Care Teams. These Care Teams include your primary Cardiologist (physician) and Advanced Practice Providers (APPs-  Physician Assistants and Nurse Practitioners) who all work together to provide you with the care you need, when you need it.   You may see any of the following providers on your designated Care Team at your next follow up: Dr Jules Oar Dr Peder Bourdon Dr. Alwin Baars Dr. Arta Lark Amy Marijane Shoulders, NP Ruddy Corral, [place] [facility] [place], [place] Dennise Fitz, NP [place] Lee, NP Shawnee Dellen, NP Luster Salters, PharmD Bevely Brush, PharmD   Please be sure to bring in all your medications bottles to every appointment.    Thank you for choosing [facility]

## 2023-08-06 NOTE — Progress Notes (Signed)
 CARDIO-ONCOLOGY CLINIC  NOTE  Primary Care: none HF Cardiologist: Dr. Julane Ny   HPI:  Connie Wells is a 38 y.o. female with h/o tobacco use, congenital solitary kidney and stage IV right breast cancer  ER/PR - Hercpetin +) referred by Dr. Phyllis Breeze for enrollment into the Cardio-Oncology program.  Diagnosed with R breast cancer in 5/22. Underwent 6 cycles of induction THP in 6/22 and then started on H/P q 3 weeks.   - Echo 6/22 EF 60-65% GLS -17.4% - Echo 1/23 EF 55% GLS -18.2 mild to moderate RVSP - Echo 07/20/21 55-60%   - Echo 11/23/21, EF 55-60%. - Echo 07/03/22 EF 55-60% GLS -17.2%   Admitted 01/25 with sepsis secondary to Strep Pneumoniae pneumonia and bacteremia. ID consulted and completed antibiotics. Echo during admit with EF 45-50%, RV okaymild to moderate MR. She later had TEE which was not consistent with endocarditis.  Here today for HF follow-up. Has been doing well from cardiac standpoint. No chest pain, shortness of breath, orthopnea, PND or lower extremity edema. No concerns. Stopped taking losartan  and metoprolol  xl after she ran out.   Occasional ETOH on weekends. May have a cigarette if she has a drink but denies regular tobacco use.   Past Medical History:  Diagnosis Date   Herpes genitalia    Solitary kidney    Current Outpatient Medications  Medication Sig Dispense Refill   carvedilol  (COREG ) 3.125 MG tablet Take 1 tablet (3.125 mg total) by mouth 2 (two) times daily with a meal. 60 tablet 1   folic acid  (FOLVITE ) 1 MG tablet Take 1 tablet (1 mg total) by mouth daily. 30 tablet 0   metoprolol  succinate (TOPROL  XL) 25 MG 24 hr tablet Take 1 tablet (25 mg total) by mouth daily. 90 tablet 3   losartan  (COZAAR ) 25 MG tablet Take 1 tablet (25 mg total) by mouth daily. 90 tablet 3   No current facility-administered medications for this encounter.   No Known Allergies  Social History   Socioeconomic History   Marital status: Single    Spouse name: Not  on file   Number of children: Not on file   Years of education: Not on file   Highest education level: Not on file  Occupational History   Not on file  Tobacco Use   Smoking status: Every Day    Current packs/day: 0.50    Average packs/day: 0.5 packs/day for 5.0 years (2.5 ttl pk-yrs)    Types: Cigarettes   Smokeless tobacco: Never  Substance and Sexual Activity   Alcohol use: No    Comment: Occassional   Drug use: Yes    Types: Marijuana   Sexual activity: Yes    Birth control/protection: None  Other Topics Concern   Not on file  Social History Narrative   Not on file   Social Drivers of Health   Financial Resource Strain: Not on file  Food Insecurity: No Food Insecurity (03/20/2023)   Hunger Vital Sign    Worried About Running Out of Food in the Last Year: Never true    Ran Out of Food in the Last Year: Never true  Recent Concern: Food Insecurity - Food Insecurity Present (01/21/2023)   Hunger Vital Sign    Worried About Running Out of Food in the Last Year: Sometimes true    Ran Out of Food in the Last Year: Sometimes true  Transportation Needs: No Transportation Needs (03/19/2023)   PRAPARE - Transportation    Lack of  Transportation (Medical): No    Lack of Transportation (Non-Medical): No  Recent Concern: Transportation Needs - Unmet Transportation Needs (01/21/2023)   PRAPARE - Administrator, Civil Service (Medical): Yes    Lack of Transportation (Non-Medical): No  Physical Activity: Not on file  Stress: Not on file  Social Connections: Unknown (03/19/2023)   Social Connection and Isolation Panel [NHANES]    Frequency of Communication with Friends and Family: More than three times a week    Frequency of Social Gatherings with Friends and Family: More than three times a week    Attends Religious Services: Patient declined    Active Member of Clubs or Organizations: Yes    Attends Banker Meetings: Patient declined    Marital Status: Not  on file  Intimate Partner Violence: Not At Risk (03/19/2023)   Humiliation, Afraid, Rape, and Kick questionnaire    Fear of Current or Ex-Partner: No    Emotionally Abused: No    Physically Abused: No    Sexually Abused: No   Family History  Problem Relation Age of Onset   Diabetes Mother    Hypertension Mother    Diabetes Maternal Aunt    Asthma Brother    BP (!) 142/80   Pulse (!) 105   Ht 5' 6 (1.676 m)   Wt 57.1 kg (125 lb 12.8 oz)   SpO2 97%   BMI 20.30 kg/m   Wt Readings from Last 3 Encounters:  08/06/23 57.1 kg (125 lb 12.8 oz)  07/29/23 57.8 kg (127 lb 8 oz)  07/08/23 58.2 kg (128 lb 4 oz)   PHYSICAL EXAM: General:  Thin, well appearing Neck: no JVD.  Cor: Regular rate & rhythm. No rubs, gallops or murmurs. Lungs: clear Abdomen: soft, nontender, nondistended.  Extremities: no edema Neuro: alert & orientedx3. Affect pleasant   ASSESSMENT & PLAN:  1 Right Breast Cancer, stage IV (widely metastatic)  - Diagnosed 5/22. ER/PR - HER-2 + - S/p induction treatment with Taxotere, Herceptin , and pertuzumab  x 6 cycles starting in 6/22 - Now on H/P q 3 weeks. Tolerating well.   2. Herceptin  induced cardiomyopathy - Echo 6/22 EF 60-65% GLS -17.4% - Echo 1/23 EF 55% GLS -18.2 - Echo 3/23 in clinic EF 50-55%. Suspect early Herceptin  cardiotoxcity - Echo 07/20/21 EF 55-60% (EF normalized) - Echo  11/23/21 EF 60-65%. - Echo 07/03/22 EF 55-60% GLS -17.2%  - Echo 01/25 EF read as 45-50% (50-55% on Dr. Julane Ny review) - NYHA I. Volume looks good. - Restart losartan  25 mg daily, has CMET scheduled with Oncology in 2 weeks - Restart Toprol  xl 25 mg daily - Repeat echo for monitoring on herceptin   3. HTN - BP elevated - Medications restarted as above  Follow-up: 4 months with Dr. Bensimhon  Connie Stallsmith N, PA-C  2:18 PM

## 2023-08-08 ENCOUNTER — Other Ambulatory Visit: Payer: Self-pay

## 2023-08-14 ENCOUNTER — Ambulatory Visit (HOSPITAL_COMMUNITY)
Admission: RE | Admit: 2023-08-14 | Discharge: 2023-08-14 | Disposition: A | Discharge status: Home / Self Care | Source: Ambulatory Visit | Attending: Hematology and Oncology | Admitting: Hematology and Oncology

## 2023-08-14 DIAGNOSIS — C50911 Malignant neoplasm of unspecified site of right female breast: Secondary | ICD-10-CM | POA: Diagnosis present

## 2023-08-14 MED ORDER — IOHEXOL 300 MG/ML  SOLN
100.0000 mL | Freq: Once | INTRAMUSCULAR | Status: AC | PRN
  Administered 2023-08-14: 100 mL via INTRAVENOUS

## 2023-08-19 ENCOUNTER — Inpatient Hospital Stay

## 2023-08-19 ENCOUNTER — Inpatient Hospital Stay (HOSPITAL_BASED_OUTPATIENT_CLINIC_OR_DEPARTMENT_OTHER): Admitting: Hematology and Oncology

## 2023-08-19 VITALS — BP 140/88 | HR 89 | Temp 98.2°F | Resp 18 | Ht 66.0 in | Wt 130.0 lb

## 2023-08-19 DIAGNOSIS — C50911 Malignant neoplasm of unspecified site of right female breast: Secondary | ICD-10-CM

## 2023-08-19 DIAGNOSIS — Z5112 Encounter for antineoplastic immunotherapy: Secondary | ICD-10-CM | POA: Diagnosis not present

## 2023-08-19 MED ORDER — SODIUM CHLORIDE 0.9 % IV SOLN
420.0000 mg | Freq: Once | INTRAVENOUS | Status: AC
  Administered 2023-08-19: 420 mg via INTRAVENOUS
  Filled 2023-08-19: qty 14

## 2023-08-19 MED ORDER — DIPHENHYDRAMINE HCL 25 MG PO CAPS
50.0000 mg | ORAL_CAPSULE | Freq: Once | ORAL | Status: AC
  Administered 2023-08-19: 50 mg via ORAL
  Filled 2023-08-19: qty 2

## 2023-08-19 MED ORDER — SODIUM CHLORIDE 0.9 % IV SOLN
INTRAVENOUS | Status: DC
Start: 2023-08-19 — End: 2023-08-19

## 2023-08-19 MED ORDER — ACETAMINOPHEN 325 MG PO TABS
650.0000 mg | ORAL_TABLET | Freq: Once | ORAL | Status: AC
  Administered 2023-08-19: 650 mg via ORAL
  Filled 2023-08-19: qty 2

## 2023-08-19 MED ORDER — SODIUM CHLORIDE 0.9% FLUSH
10.0000 mL | INTRAVENOUS | Status: DC | PRN
  Administered 2023-08-19: 10 mL

## 2023-08-19 MED ORDER — HEPARIN SOD (PORK) LOCK FLUSH 100 UNIT/ML IV SOLN
500.0000 [IU] | Freq: Once | INTRAVENOUS | Status: AC | PRN
  Administered 2023-08-19: 500 [IU]

## 2023-08-19 MED ORDER — TRASTUZUMAB-ANNS CHEMO 150 MG IV SOLR
6.0000 mg/kg | Freq: Once | INTRAVENOUS | Status: AC
  Administered 2023-08-19: 357 mg via INTRAVENOUS
  Filled 2023-08-19: qty 17

## 2023-08-19 NOTE — Assessment & Plan Note (Signed)
 06/16/2020 stage IV: T2 N1 M1 ER/PR negative HER2 positive metastatic breast cancer (diagnosed and treated by Dr. Fernand) 08/09/2020 -11/02/2020 6 cycles of THP, on Herceptin  and Perjeta  maintenance Right breast DCIS Current treatment: Herceptin  Perjeta  maintenance since 12/14/2020 (discontinued November 2023 after she left Dr. Kathern clinic) resumed 04/16/2023   CT CAP 03/18/2023: New dense consolidation right lower lobe, minimal spillover to right middle lobe favoring bacterial pneumonia versus pulmonary hemorrhage, substantial improvement in the right breast lesions and right axillary lymph nodes, left adrenal mass  2 cm: Benign   Hospitalization 03/18/2023-03/25/2023: Pneumococcal bacteremia and sepsis   Treatment plan: Resumed Herceptin  and Perjeta  Echocardiogram completed 01/20/2023: Echo by Dr. Cherrie 01/20/23 EF 60-65%, GLS -16.9%  Echo 03/21/2023: 45 to 50% (Dr. Cherrie reviewed it and felt it was more 50 to 55% and that he was fine for us  to proceed with her treatment)  08/15/2023: CT CAP: Right breast lesions, minimal improvement right axillary lymph nodes, left axillary lymph nodes unchanged, no evidence of progression.  Left adrenal nodule, absent right kidney  Return to clinic every 3 weeks for Herceptin  Perjeta 

## 2023-08-19 NOTE — Patient Instructions (Signed)
 [facility] - A DEPT OF [facility]  Discharge Instructions: Thank you for choosing [facility] to provide your oncology and hematology care.   If you have a lab appointment with the Cancer Center, please go directly to the Cancer Center and check in at the registration area.   Wear comfortable clothing and clothing appropriate for easy access to any Portacath or PICC line.   We strive to give you quality time with your provider. You may need to reschedule your appointment if you arrive late (15 or more minutes).  Arriving late affects you and other patients whose appointments are after yours.  Also, if you miss three or more appointments without notifying the office, you may be dismissed from the clinic at the provider's discretion.      For prescription refill requests, have your pharmacy contact our office and allow 72 hours for refills to be completed.    Today you received the following chemotherapy and/or immunotherapy agents: Trastuzumab, Pertuzumab      To help prevent nausea and vomiting after your treatment, we encourage you to take your nausea medication as directed.  BELOW ARE SYMPTOMS THAT SHOULD BE REPORTED IMMEDIATELY: *FEVER GREATER THAN 100.4 F (38 C) OR HIGHER *CHILLS OR SWEATING *NAUSEA AND VOMITING THAT IS NOT CONTROLLED WITH YOUR NAUSEA MEDICATION *UNUSUAL SHORTNESS OF BREATH *UNUSUAL BRUISING OR BLEEDING *URINARY PROBLEMS (pain or burning when urinating, or frequent urination) *BOWEL PROBLEMS (unusual diarrhea, constipation, pain near the anus) TENDERNESS IN MOUTH AND THROAT WITH OR WITHOUT PRESENCE OF ULCERS (sore throat, sores in mouth, or a toothache) UNUSUAL RASH, SWELLING OR PAIN  UNUSUAL VAGINAL DISCHARGE OR ITCHING   Items with * indicate a potential emergency and should be followed up as soon as possible or go to the Emergency Department if any problems should occur.  Please show the CHEMOTHERAPY ALERT CARD or  IMMUNOTHERAPY ALERT CARD at check-in to the Emergency Department and triage nurse.  Should you have questions after your visit or need to cancel or reschedule your appointment, please contact [facility] - A DEPT OF [facility]  Dept: [phone]  and follow the prompts.  Office hours are 8:00 a.m. to 4:30 p.m. Monday - Friday. Please note that voicemails left after 4:00 p.m. may not be returned until the following business day.  We are closed weekends and major holidays. You have access to a nurse at all times for urgent questions. Please call the main number to the clinic Dept: [phone] and follow the prompts.   For any non-urgent questions, you may also contact your provider using MyChart. We now offer e-Visits for anyone 34 and older to request care online for non-urgent symptoms. For details visit [email or web address].   Also download the MyChart app! Go to the app store, search "MyChart", open the app, select [place], and log in with your MyChart username and password.

## 2023-08-19 NOTE — Progress Notes (Signed)
 Patient Care Team: Patient, No Pcp Per as PCP - General (General Practice)  DIAGNOSIS:  Encounter Diagnosis  Name Primary?   Carcinoma of right breast metastatic to multiple sites [facility]) Yes    SUMMARY OF ONCOLOGIC HISTORY: Oncology History  Breast cancer metastasized to multiple sites (HCC)  06/16/2020 Initial Diagnosis   [place]:  stage IV: T2 N1 M1 ER/PR negative HER2 positive metastatic breast cancer (diagnosed and treated by Dr. Fernand) 08/09/2020 -11/02/2020 6 cycles of THP, on Herceptin  and Perjeta  maintenance Right breast DCIS   01/21/2023 -  Chemotherapy   Patient is on Treatment Plan : BREAST Trastuzumab  + Pertuzumab  q21d       CHIEF COMPLIANT: f/u on herceptin   HISTORY OF PRESENT ILLNESS:  History of Present Illness Connie Wells is a 38 year old female with breast cancer who presents for follow-up on her treatment and recent imaging results.  She is undergoing treatment with Herceptin  and Perjeta , which she tolerates well, though she has noticed spontaneous bruising without any known trauma. Recent imaging shows shrinkage of right breast lesions and multiple right axillary lymph nodes. The dominant lesion in the left breast is slightly larger, and another lesion remains unchanged. Left axillary lymph nodes are stable. There is no evidence of metastasis to the lungs or liver.  Her medical history includes a stable seven millimeter liver focus, an absent right kidney, and a small benign adrenal nodule.     ALLERGIES:  has no known allergies.  MEDICATIONS:  Current Outpatient Medications  Medication Sig Dispense Refill   carvedilol  (COREG ) 3.125 MG tablet Take 1 tablet (3.125 mg total) by mouth 2 (two) times daily with a meal. 60 tablet 1   folic acid  (FOLVITE ) 1 MG tablet Take 1 tablet (1 mg total) by mouth daily. 30 tablet 0   losartan  (COZAAR ) 25 MG tablet Take 1 tablet (25 mg total) by mouth daily. 90 tablet 3   metoprolol  succinate (TOPROL  XL) 25 MG 24  hr tablet Take 1 tablet (25 mg total) by mouth daily. 90 tablet 3   No current facility-administered medications for this visit.   Facility-Administered Medications Ordered in Other Visits  Medication Dose Route Frequency Provider Last Rate Last Admin   0.9 %  sodium chloride  infusion   Intravenous Continuous Maimouna Rondeau, MD 10 mL/hr at 08/19/23 1519 New Bag at 08/19/23 1519   heparin  lock flush 100 unit/mL  500 Units Intracatheter Once PRN Odean Potts, MD       sodium chloride  flush (NS) 0.9 % injection 10 mL  10 mL Intracatheter PRN Odean Potts, MD        PHYSICAL EXAMINATION: ECOG PERFORMANCE STATUS: 1 - Symptomatic but completely ambulatory  Vitals:   08/19/23 1357  BP: (!) 140/88  Pulse: 89  Resp: 18  Temp: 98.2 F (36.8 C)  SpO2: 100%   Filed Weights   08/19/23 1357  Weight: 129 lb 15.8 oz (59 kg)    LABORATORY DATA:  I have reviewed the data as listed    Latest Ref Rng & Units 04/16/2023   11:23 AM 03/23/2023   12:20 PM 03/22/2023    5:00 AM  CMP  Glucose 70 - 99 mg/dL 87  95  897   BUN 6 - 20 mg/dL 9  9  9    Creatinine 0.44 - 1.00 mg/dL 9.27  9.39  9.10   Sodium 135 - 145 mmol/L 135  136  138   Potassium 3.5 - 5.1 mmol/L 3.6  4.4  4.6   Chloride 98 - 111 mmol/L 106  104  109   CO2 22 - 32 mmol/L 27  27  21    Calcium  8.9 - 10.3 mg/dL 8.4  8.4  8.0   Total Protein 6.5 - 8.1 g/dL 7.1     Total Bilirubin 0.0 - 1.2 mg/dL 0.3     Alkaline Phos 38 - 126 U/L 41     AST 15 - 41 U/L 12     ALT 0 - 44 U/L 6       Lab Results  Component Value Date   WBC 7.6 04/16/2023   HGB 10.2 (L) 04/16/2023   HCT 30.9 (L) 04/16/2023   MCV 97.5 04/16/2023   PLT 236 04/16/2023   NEUTROABS 4.2 04/16/2023    ASSESSMENT & PLAN:  Breast cancer metastasized to multiple sites (HCC) 06/16/2020 stage IV: T2 N1 M1 ER/PR negative HER2 positive metastatic breast cancer (diagnosed and treated by Dr. Fernand) 08/09/2020 -11/02/2020 6 cycles of THP, on Herceptin  and Perjeta   maintenance Right breast DCIS Current treatment: Herceptin  Perjeta  maintenance since 12/14/2020 (discontinued November 2023 after she left Dr. Kathern clinic) resumed 04/16/2023   CT CAP 03/18/2023: New dense consolidation right lower lobe, minimal spillover to right middle lobe favoring bacterial pneumonia versus pulmonary hemorrhage, substantial improvement in the right breast lesions and right axillary lymph nodes, left adrenal mass  2 cm: Benign   Hospitalization 03/18/2023-03/25/2023: Pneumococcal bacteremia and sepsis   Treatment plan: Resumed Herceptin  and Perjeta  Echocardiogram completed 01/20/2023: Echo by Dr. Cherrie 01/20/23 EF 60-65%, GLS -16.9%  Echo 03/21/2023: 45 to 50% (Dr. Cherrie reviewed it and felt it was more 50 to 55% and that he was fine for us  to proceed with her treatment)  08/15/2023: CT CAP: Right breast lesions, minimal improvement right axillary lymph nodes, left axillary lymph nodes unchanged, no evidence of progression.  Left adrenal nodule, absent right kidney  Return to clinic every 3 weeks for Herceptin  Perjeta     ------------------------------------- Assessment and Plan Assessment & Plan Metastatic carcinoma of right breast Metastatic carcinoma with multiple site involvement. CT shows improved right axillary nodes, stable left axillary nodes, slight enlargement of dominant left breast lesion, unchanged medial lesion. No liver or lung metastasis. Herceptin  and Perjeta  effective and well-tolerated. Discussed potential Herceptin  frequency reduction. - Continue Herceptin  and Perjeta . - Consider future Herceptin  dosing adjustment. - Schedule regular follow-ups.  Bruising Unexplained bruising not linked to Herceptin  or Perjeta .  Benign adrenal nodule Confirmed benign adrenal nodule, stable on CT.  Goals of Care She agreed to continue current treatment for disease control, aware of Herceptin 's long-term use and effectiveness.      No orders of the  defined types were placed in this encounter.  The patient has a good understanding of the overall plan. she agrees with it. she will call with any problems that may develop before the next visit here. Total time spent: 30 mins including face to face time and time spent for planning, charting and co-ordination of care   Naomi MARLA Chad, MD 08/19/23

## 2023-08-22 ENCOUNTER — Other Ambulatory Visit: Payer: Self-pay

## 2023-09-03 ENCOUNTER — Ambulatory Visit (HOSPITAL_COMMUNITY): Admission: RE | Admit: 2023-09-03 | Source: Ambulatory Visit

## 2023-09-05 ENCOUNTER — Ambulatory Visit (HOSPITAL_COMMUNITY)
Admission: RE | Admit: 2023-09-05 | Discharge: 2023-09-05 | Disposition: A | Discharge status: Home / Self Care | Source: Ambulatory Visit | Attending: Physician Assistant | Admitting: Physician Assistant

## 2023-09-05 DIAGNOSIS — I071 Rheumatic tricuspid insufficiency: Secondary | ICD-10-CM | POA: Diagnosis not present

## 2023-09-05 DIAGNOSIS — I427 Cardiomyopathy due to drug and external agent: Secondary | ICD-10-CM | POA: Diagnosis present

## 2023-09-05 DIAGNOSIS — T451X5A Adverse effect of antineoplastic and immunosuppressive drugs, initial encounter: Secondary | ICD-10-CM | POA: Diagnosis not present

## 2023-09-05 DIAGNOSIS — I059 Rheumatic mitral valve disease, unspecified: Secondary | ICD-10-CM | POA: Insufficient documentation

## 2023-09-05 LAB — ECHOCARDIOGRAM COMPLETE
Area-P 1/2: 4.57 cm2
S' Lateral: 3.3 cm

## 2023-09-08 ENCOUNTER — Ambulatory Visit (HOSPITAL_COMMUNITY): Payer: Self-pay | Admitting: Physician Assistant

## 2023-09-08 ENCOUNTER — Inpatient Hospital Stay

## 2023-09-08 ENCOUNTER — Inpatient Hospital Stay: Attending: Hematology and Oncology | Admitting: Hematology and Oncology

## 2023-09-08 DIAGNOSIS — Z1722 Progesterone receptor negative status: Secondary | ICD-10-CM | POA: Insufficient documentation

## 2023-09-08 DIAGNOSIS — Z5112 Encounter for antineoplastic immunotherapy: Secondary | ICD-10-CM | POA: Insufficient documentation

## 2023-09-08 DIAGNOSIS — Z1731 Human epidermal growth factor receptor 2 positive status: Secondary | ICD-10-CM | POA: Insufficient documentation

## 2023-09-08 DIAGNOSIS — C50311 Malignant neoplasm of lower-inner quadrant of right female breast: Secondary | ICD-10-CM | POA: Insufficient documentation

## 2023-09-08 DIAGNOSIS — Z171 Estrogen receptor negative status [ER-]: Secondary | ICD-10-CM | POA: Insufficient documentation

## 2023-09-08 DIAGNOSIS — C787 Secondary malignant neoplasm of liver and intrahepatic bile duct: Secondary | ICD-10-CM | POA: Insufficient documentation

## 2023-09-08 NOTE — Assessment & Plan Note (Deleted)
 06/16/2020 stage IV: T2 N1 M1 ER/PR negative HER2 positive metastatic breast cancer (diagnosed and treated by Dr. Fernand) 08/09/2020 -11/02/2020 6 cycles of THP, on Herceptin  and Perjeta  maintenance Right breast DCIS Current treatment: Herceptin  Perjeta  maintenance since 12/14/2020 (discontinued November 2023 after she left Dr. Kathern clinic) resumed 04/16/2023   CT CAP 03/18/2023: New dense consolidation right lower lobe, minimal spillover to right middle lobe favoring bacterial pneumonia versus pulmonary hemorrhage, substantial improvement in the right breast lesions and right axillary lymph nodes, left adrenal mass  2 cm: Benign   Hospitalization 03/18/2023-03/25/2023: Pneumococcal bacteremia and sepsis   Treatment plan: Resumed Herceptin  and Perjeta  Echocardiogram completed 01/20/2023: Echo by Dr. Cherrie 01/20/23 EF 60-65%, GLS -16.9%  Echo 03/21/2023: 45 to 50% (Dr. Cherrie reviewed it and felt it was more 50 to 55% and that he was fine for us  to proceed with her treatment) Echo 09/05/2023: EF 50 to 55%   08/15/2023: CT CAP: Right breast lesions, minimal improvement right axillary lymph nodes, left axillary lymph nodes unchanged, no evidence of progression.  Left adrenal nodule, absent right kidney   Return to clinic every 3 weeks for Herceptin  Perjeta 

## 2023-09-09 ENCOUNTER — Other Ambulatory Visit: Payer: Self-pay

## 2023-09-09 ENCOUNTER — Ambulatory Visit

## 2023-09-09 ENCOUNTER — Ambulatory Visit: Admitting: Hematology and Oncology

## 2023-09-09 ENCOUNTER — Telehealth: Payer: Self-pay | Admitting: Hematology and Oncology

## 2023-09-09 NOTE — Telephone Encounter (Signed)
 Spoke with pt's mother to let her know that we would have to reschedule canceled appt and if she could call back so we could schedule

## 2023-09-10 ENCOUNTER — Other Ambulatory Visit: Payer: Self-pay

## 2023-09-16 ENCOUNTER — Inpatient Hospital Stay

## 2023-09-16 VITALS — BP 158/97 | HR 87 | Temp 98.2°F | Resp 17 | Wt 126.8 lb

## 2023-09-16 DIAGNOSIS — Z171 Estrogen receptor negative status [ER-]: Secondary | ICD-10-CM | POA: Diagnosis not present

## 2023-09-16 DIAGNOSIS — Z1722 Progesterone receptor negative status: Secondary | ICD-10-CM | POA: Diagnosis not present

## 2023-09-16 DIAGNOSIS — C787 Secondary malignant neoplasm of liver and intrahepatic bile duct: Secondary | ICD-10-CM | POA: Diagnosis present

## 2023-09-16 DIAGNOSIS — C50911 Malignant neoplasm of unspecified site of right female breast: Secondary | ICD-10-CM

## 2023-09-16 DIAGNOSIS — C50311 Malignant neoplasm of lower-inner quadrant of right female breast: Secondary | ICD-10-CM | POA: Diagnosis present

## 2023-09-16 DIAGNOSIS — Z1731 Human epidermal growth factor receptor 2 positive status: Secondary | ICD-10-CM | POA: Diagnosis not present

## 2023-09-16 DIAGNOSIS — Z5112 Encounter for antineoplastic immunotherapy: Secondary | ICD-10-CM | POA: Diagnosis present

## 2023-09-16 MED ORDER — SODIUM CHLORIDE 0.9 % IV SOLN
420.0000 mg | Freq: Once | INTRAVENOUS | Status: AC
  Administered 2023-09-16: 420 mg via INTRAVENOUS
  Filled 2023-09-16: qty 14

## 2023-09-16 MED ORDER — DIPHENHYDRAMINE HCL 25 MG PO CAPS
50.0000 mg | ORAL_CAPSULE | Freq: Once | ORAL | Status: AC
  Administered 2023-09-16: 50 mg via ORAL
  Filled 2023-09-16: qty 2

## 2023-09-16 MED ORDER — SODIUM CHLORIDE 0.9 % IV SOLN: INTRAVENOUS | Status: DC

## 2023-09-16 MED ORDER — ACETAMINOPHEN 325 MG PO TABS
650.0000 mg | ORAL_TABLET | Freq: Once | ORAL | Status: AC
Start: 2023-09-16 — End: 2023-09-16
  Administered 2023-09-16: 650 mg via ORAL
  Filled 2023-09-16: qty 2

## 2023-09-16 MED ORDER — TRASTUZUMAB-ANNS CHEMO 150 MG IV SOLR
6.0000 mg/kg | Freq: Once | INTRAVENOUS | Status: AC
  Administered 2023-09-16: 357 mg via INTRAVENOUS
  Filled 2023-09-16: qty 17

## 2023-09-16 NOTE — Patient Instructions (Signed)
 [facility] - A DEPT OF [facility]  Discharge Instructions: Thank you for choosing [facility] to provide your oncology and hematology care.   If you have a lab appointment with the Cancer Center, please go directly to the Cancer Center and check in at the registration area.   Wear comfortable clothing and clothing appropriate for easy access to any Portacath or PICC line.   We strive to give you quality time with your provider. You may need to reschedule your appointment if you arrive late (15 or more minutes).  Arriving late affects you and other patients whose appointments are after yours.  Also, if you miss three or more appointments without notifying the office, you may be dismissed from the clinic at the provider's discretion.      For prescription refill requests, have your pharmacy contact our office and allow 72 hours for refills to be completed.    Today you received the following chemotherapy and/or immunotherapy agents: Trastuzumab, Pertuzumab      To help prevent nausea and vomiting after your treatment, we encourage you to take your nausea medication as directed.  BELOW ARE SYMPTOMS THAT SHOULD BE REPORTED IMMEDIATELY: *FEVER GREATER THAN 100.4 F (38 C) OR HIGHER *CHILLS OR SWEATING *NAUSEA AND VOMITING THAT IS NOT CONTROLLED WITH YOUR NAUSEA MEDICATION *UNUSUAL SHORTNESS OF BREATH *UNUSUAL BRUISING OR BLEEDING *URINARY PROBLEMS (pain or burning when urinating, or frequent urination) *BOWEL PROBLEMS (unusual diarrhea, constipation, pain near the anus) TENDERNESS IN MOUTH AND THROAT WITH OR WITHOUT PRESENCE OF ULCERS (sore throat, sores in mouth, or a toothache) UNUSUAL RASH, SWELLING OR PAIN  UNUSUAL VAGINAL DISCHARGE OR ITCHING   Items with * indicate a potential emergency and should be followed up as soon as possible or go to the Emergency Department if any problems should occur.  Please show the CHEMOTHERAPY ALERT CARD or  IMMUNOTHERAPY ALERT CARD at check-in to the Emergency Department and triage nurse.  Should you have questions after your visit or need to cancel or reschedule your appointment, please contact [facility] - A DEPT OF [facility]  Dept: [phone]  and follow the prompts.  Office hours are 8:00 a.m. to 4:30 p.m. Monday - Friday. Please note that voicemails left after 4:00 p.m. may not be returned until the following business day.  We are closed weekends and major holidays. You have access to a nurse at all times for urgent questions. Please call the main number to the clinic Dept: [phone] and follow the prompts.   For any non-urgent questions, you may also contact your provider using MyChart. We now offer e-Visits for anyone 34 and older to request care online for non-urgent symptoms. For details visit [email or web address].   Also download the MyChart app! Go to the app store, search "MyChart", open the app, select [place], and log in with your MyChart username and password.

## 2023-09-26 ENCOUNTER — Other Ambulatory Visit: Payer: Self-pay

## 2023-09-30 ENCOUNTER — Encounter: Payer: Self-pay | Admitting: Hematology and Oncology

## 2023-09-30 ENCOUNTER — Encounter: Payer: Self-pay | Admitting: Adult Health

## 2023-09-30 ENCOUNTER — Inpatient Hospital Stay (HOSPITAL_BASED_OUTPATIENT_CLINIC_OR_DEPARTMENT_OTHER): Admitting: Adult Health

## 2023-09-30 ENCOUNTER — Inpatient Hospital Stay: Attending: Hematology and Oncology

## 2023-09-30 VITALS — BP 158/115 | HR 97 | Temp 97.6°F | Resp 18 | Ht 66.0 in | Wt 120.0 lb

## 2023-09-30 VITALS — BP 148/98

## 2023-09-30 DIAGNOSIS — C50911 Malignant neoplasm of unspecified site of right female breast: Secondary | ICD-10-CM

## 2023-09-30 DIAGNOSIS — C50311 Malignant neoplasm of lower-inner quadrant of right female breast: Secondary | ICD-10-CM | POA: Diagnosis present

## 2023-09-30 DIAGNOSIS — Z5112 Encounter for antineoplastic immunotherapy: Secondary | ICD-10-CM | POA: Insufficient documentation

## 2023-09-30 DIAGNOSIS — Z1722 Progesterone receptor negative status: Secondary | ICD-10-CM | POA: Diagnosis not present

## 2023-09-30 DIAGNOSIS — F17210 Nicotine dependence, cigarettes, uncomplicated: Secondary | ICD-10-CM | POA: Diagnosis not present

## 2023-09-30 DIAGNOSIS — C787 Secondary malignant neoplasm of liver and intrahepatic bile duct: Secondary | ICD-10-CM | POA: Diagnosis present

## 2023-09-30 DIAGNOSIS — Z171 Estrogen receptor negative status [ER-]: Secondary | ICD-10-CM | POA: Diagnosis not present

## 2023-09-30 DIAGNOSIS — C7951 Secondary malignant neoplasm of bone: Secondary | ICD-10-CM | POA: Diagnosis not present

## 2023-09-30 DIAGNOSIS — Z1732 Human epidermal growth factor receptor 2 negative status: Secondary | ICD-10-CM | POA: Diagnosis not present

## 2023-09-30 MED ORDER — SODIUM CHLORIDE 0.9 % IV SOLN: 420.0000 mg | Freq: Once | INTRAVENOUS | Status: DC

## 2023-09-30 MED ORDER — SODIUM CHLORIDE 0.9 % IV SOLN: INTRAVENOUS | Status: DC

## 2023-09-30 MED ORDER — DIPHENHYDRAMINE HCL 25 MG PO CAPS
50.0000 mg | ORAL_CAPSULE | Freq: Once | ORAL | Status: AC
  Administered 2023-09-30: 50 mg via ORAL
  Filled 2023-09-30: qty 2

## 2023-09-30 MED ORDER — TRASTUZUMAB-ANNS CHEMO 150 MG IV SOLR: 6.0000 mg/kg | Freq: Once | INTRAVENOUS | Status: DC

## 2023-09-30 MED ORDER — ACETAMINOPHEN 325 MG PO TABS
650.0000 mg | ORAL_TABLET | Freq: Once | ORAL | Status: AC
  Administered 2023-09-30: 650 mg via ORAL
  Filled 2023-09-30: qty 2

## 2023-09-30 NOTE — Patient Instructions (Signed)
 [facility] - A DEPT OF [facility]  Discharge Instructions: Thank you for choosing [facility] to provide your oncology and hematology care.   If you have a lab appointment with the Cancer Center, please go directly to the Cancer Center and check in at the registration area.   Wear comfortable clothing and clothing appropriate for easy access to any Portacath or PICC line.   We strive to give you quality time with your provider. You may need to reschedule your appointment if you arrive late (15 or more minutes).  Arriving late affects you and other patients whose appointments are after yours.  Also, if you miss three or more appointments without notifying the office, you may be dismissed from the clinic at the provider's discretion.      For prescription refill requests, have your pharmacy contact our office and allow 72 hours for refills to be completed.    Today you received the following chemotherapy and/or immunotherapy agents: trastuzumab  and pertuzumab       To help prevent nausea and vomiting after your treatment, we encourage you to take your nausea medication as directed.  BELOW ARE SYMPTOMS THAT SHOULD BE REPORTED IMMEDIATELY: *FEVER GREATER THAN 100.4 F (38 C) OR HIGHER *CHILLS OR SWEATING *NAUSEA AND VOMITING THAT IS NOT CONTROLLED WITH YOUR NAUSEA MEDICATION *UNUSUAL SHORTNESS OF BREATH *UNUSUAL BRUISING OR BLEEDING *URINARY PROBLEMS (pain or burning when urinating, or frequent urination) *BOWEL PROBLEMS (unusual diarrhea, constipation, pain near the anus) TENDERNESS IN MOUTH AND THROAT WITH OR WITHOUT PRESENCE OF ULCERS (sore throat, sores in mouth, or a toothache) UNUSUAL RASH, SWELLING OR PAIN  UNUSUAL VAGINAL DISCHARGE OR ITCHING   Items with * indicate a potential emergency and should be followed up as soon as possible or go to the Emergency Department if any problems should occur.  Please show the CHEMOTHERAPY ALERT CARD  or IMMUNOTHERAPY ALERT CARD at check-in to the Emergency Department and triage nurse.  Should you have questions after your visit or need to cancel or reschedule your appointment, please contact [facility] - A DEPT OF [facility]  Dept: [phone]  and follow the prompts.  Office hours are 8:00 a.m. to 4:30 p.m. Monday - Friday. Please note that voicemails left after 4:00 p.m. may not be returned until the following business day.  We are closed weekends and major holidays. You have access to a nurse at all times for urgent questions. Please call the main number to the clinic Dept: [phone] and follow the prompts.   For any non-urgent questions, you may also contact your provider using MyChart. We now offer e-Visits for anyone 30 and older to request care online for non-urgent symptoms. For details visit [email or web address].   Also download the MyChart app! Go to the app store, search "MyChart", open the app, select [facility], and log in with your MyChart username and password.

## 2023-09-30 NOTE — Progress Notes (Unsigned)
 [name], [name]   DIAGNOSIS: Cancer Staging  No matching staging information was found for the patient.    SUMMARY OF ONCOLOGIC HISTORY: Oncology History  Breast cancer metastasized to multiple sites (HCC)  06/16/2020 Initial Diagnosis   [place]:  stage IV: T2 N1 M1 ER/PR negative HER2 positive metastatic breast cancer (diagnosed and treated by Dr. Fernand) 08/09/2020 -11/02/2020 6 cycles of THP, on Herceptin  and Perjeta  maintenance Right breast DCIS   01/21/2023 -  Chemotherapy   Patient is on Treatment Plan : BREAST Trastuzumab  + Pertuzumab  q21d       CURRENT THERAPY:  INTERVAL HISTORY:  Discussed the use of AI scribe software for clinical note transcription with the patient, who gave verbal consent to proceed.  Connie Wells 38 y.o. female returns for    Patient Active Problem List   Diagnosis Date Noted   Pneumococcal bacteremia 03/24/2023   Sepsis due to pneumonia (HCC) 03/19/2023   AKI (acute kidney injury) (HCC) 03/19/2023   History of breast cancer 03/19/2023   GAD (generalized anxiety disorder) 03/19/2023   CAP (community acquired pneumonia) 03/19/2023   Breast cancer metastasized to multiple sites (HCC) 08/21/2022   Cellulitis of pharynx    Pharyngitis 04/20/2015   Postpartum care and examination 12/27/2013   Contraception management 12/27/2013   GBS bacteriuria 10/14/2013   Drug use complicating pregnancy in second trimester 07/06/2013   Hx of pyelonephritis 06/08/2013   HSV-2 infection complicating pregnancy 06/08/2013   SOLITARY KIDNEY 05/28/2006   ANA POSITIVE, HX OF 05/02/2006    has no known allergies.  MEDICAL HISTORY: Past Medical History:  Diagnosis Date   Herpes genitalia    Solitary kidney     SURGICAL HISTORY: Past Surgical History:  Procedure Laterality Date   DILATION AND EVACUATION     ECTOPIC PREGNANCY SURGERY     TRANSESOPHAGEAL ECHOCARDIOGRAM (CATH  LAB) N/A 03/25/2023   Procedure: TRANSESOPHAGEAL ECHOCARDIOGRAM;  Surgeon: Raford Riggs, MD;  Location: [facility] INVASIVE CV LAB;  Service: Cardiovascular;  Laterality: N/A;    SOCIAL HISTORY: Social History   Socioeconomic History   Marital status: Single    Spouse name: Not on file   Number of children: Not on file   Years of education: Not on file   Highest education level: Not on file  Occupational History   Not on file  Tobacco Use   Smoking status: Every Day    Current packs/day: 0.50    Average packs/day: 0.5 packs/day for 5.0 years (2.5 ttl pk-yrs)    Types: Cigarettes   Smokeless tobacco: Never  Substance and Sexual Activity   Alcohol use: No    Comment: Occassional   Drug use: Yes    Types: Marijuana   Sexual activity: Yes    Birth control/protection: None  Other Topics Concern   Not on file  Social History Narrative   Not on file   Social Drivers of Health   Financial Resource Strain: Not on file  Food Insecurity: No Food Insecurity (03/20/2023)   Hunger Vital Sign    Worried About Running Out of Food in the Last Year: Never true    Ran Out of Food in the Last Year: Never true  Recent Concern: Food Insecurity - Food Insecurity Present (01/21/2023)   Hunger Vital Sign    Worried About Running Out of Food in the Last Year: Sometimes true    Ran Out of Food in  the Last Year: Sometimes true  Transportation Needs: No Transportation Needs (03/19/2023)   PRAPARE - Administrator, Civil Service (Medical): No    Lack of Transportation (Non-Medical): No  Recent Concern: Transportation Needs - Unmet Transportation Needs (01/21/2023)   PRAPARE - Administrator, Civil Service (Medical): Yes    Lack of Transportation (Non-Medical): No  Physical Activity: Not on file  Stress: Not on file  Social Connections: Unknown (03/19/2023)   Social Connection and Isolation Panel    Frequency of Communication with Friends and Family: More than three times a  week    Frequency of Social Gatherings with Friends and Family: More than three times a week    Attends Religious Services: Patient declined    Database administrator or Organizations: Yes    Attends Banker Meetings: Patient declined    Marital Status: Not on file  Intimate Partner Violence: Not At Risk (03/19/2023)   Humiliation, Afraid, Rape, and Kick questionnaire    Fear of Current or Ex-Partner: No    Emotionally Abused: No    Physically Abused: No    Sexually Abused: No    FAMILY HISTORY: Family History  Problem Relation Age of Onset   Diabetes Mother    Hypertension Mother    Diabetes Maternal Aunt    Asthma Brother     Review of Systems  Constitutional:  Negative for appetite change, chills, fatigue, fever and unexpected weight change.  HENT:   Negative for hearing loss, lump/mass and trouble swallowing.   Eyes:  Negative for eye problems and icterus.  Respiratory:  Negative for chest tightness, cough and shortness of breath.   Cardiovascular:  Negative for chest pain, leg swelling and palpitations.  Gastrointestinal:  Negative for abdominal distention, abdominal pain, constipation, diarrhea, nausea and vomiting.  Endocrine: Negative for hot flashes.  Genitourinary:  Negative for difficulty urinating.   Musculoskeletal:  Negative for arthralgias.  Skin:  Negative for itching and rash.  Neurological:  Negative for dizziness, extremity weakness, headaches and numbness.  Hematological:  Negative for adenopathy. Does not bruise/bleed easily.  Psychiatric/Behavioral:  Negative for depression. The patient is not nervous/anxious.       PHYSICAL EXAMINATION    Vitals:   09/30/23 1415  BP: (!) 158/115  Pulse: 97  Resp: 18  Temp: 97.6 F (36.4 C)  SpO2: 100%    Physical Exam Constitutional:      General: She is not in acute distress.    Appearance: Normal appearance. She is not toxic-appearing.  HENT:     Head: Normocephalic and atraumatic.      Mouth/Throat:     Mouth: Mucous membranes are moist.     Pharynx: Oropharynx is clear. No oropharyngeal exudate or posterior oropharyngeal erythema.  Eyes:     General: No scleral icterus. Cardiovascular:     Rate and Rhythm: Normal rate and regular rhythm.     Pulses: Normal pulses.     Heart sounds: Normal heart sounds.  Pulmonary:     Effort: Pulmonary effort is normal.     Breath sounds: Normal breath sounds.  Chest:     Comments: Right central breast mass palpated Abdominal:     General: Abdomen is flat. Bowel sounds are normal. There is no distension.     Palpations: Abdomen is soft.     Tenderness: There is no abdominal tenderness.  Musculoskeletal:        General: No swelling.     Cervical  back: Neck supple.  Lymphadenopathy:     Cervical: No cervical adenopathy.     Upper Body:     Right upper body: No supraclavicular or axillary adenopathy.     Left upper body: No supraclavicular or axillary adenopathy.  Skin:    General: Skin is warm and dry.     Findings: No rash.  Neurological:     General: No focal deficit present.     Mental Status: She is alert.  Psychiatric:        Mood and Affect: Mood normal.        Behavior: Behavior normal.     LABORATORY DATA:  CBC    Component Value Date/Time   WBC 7.6 04/16/2023 1123   WBC 7.9 03/23/2023 1220   RBC 3.17 (L) 04/16/2023 1123   HGB 10.2 (L) 04/16/2023 1123   HCT 30.9 (L) 04/16/2023 1123   PLT 236 04/16/2023 1123   MCV 97.5 04/16/2023 1123   MCH 32.2 04/16/2023 1123   MCHC 33.0 04/16/2023 1123   RDW 14.9 04/16/2023 1123   LYMPHSABS 2.6 04/16/2023 1123   MONOABS 0.7 04/16/2023 1123   EOSABS 0.1 04/16/2023 1123   BASOSABS 0.1 04/16/2023 1123    CMP     Component Value Date/Time   NA 135 04/16/2023 1123   K 3.6 04/16/2023 1123   CL 106 04/16/2023 1123   CO2 27 04/16/2023 1123   GLUCOSE 87 04/16/2023 1123   BUN 9 04/16/2023 1123   CREATININE 0.72 04/16/2023 1123   CREATININE 0.58 07/06/2013 1612    CALCIUM  8.4 (L) 04/16/2023 1123   PROT 7.1 04/16/2023 1123   ALBUMIN  3.1 (L) 04/16/2023 1123   AST 12 (L) 04/16/2023 1123   ALT 6 04/16/2023 1123   ALKPHOS 41 04/16/2023 1123   BILITOT 0.3 04/16/2023 1123   GFRNONAA >60 04/16/2023 1123   GFRAA >60 04/21/2015 0422     ASSESSMENT and THERAPY PLAN:   Assessment and Plan Assessment & Plan HER2-positive breast cancer on Herceptin  and Perjeta  HER2-positive breast cancer managed with Herceptin  and Perjeta . CT scans and echocardiogram show stable disease with ejection fraction 50-55% and normal longitudinal strain. No new significant symptoms, but tender breast nodules present. - Continue Herceptin  and Perjeta . - Order next scans before September visit with Dr. Odean - Continue f/u with Dr. Cherrie  Tender breast nodules under surveillance Tender breast nodules with persistent tenderness. - Monitor breast nodules for changes in size or tenderness.        All questions were answered. The patient knows to call the clinic with any problems, questions or concerns. We can certainly see the patient much sooner if necessary.  Total encounter time:20 minutes*in face-to-face visit time, chart review, lab review, care coordination, order entry, and documentation of the encounter time.    Morna Kendall, NP 09/30/23 2:24 PM Medical Oncology and Hematology [facility] [address] [place], [place] [address] Tel. [phone]    Fax. [phone]  *Total Encounter Time as defined by the Centers for Medicare and Medicaid Services includes, in addition to the face-to-face time of a patient visit (documented in the note above) non-face-to-face time: obtaining and reviewing outside history, ordering and reviewing medications, tests or procedures, care coordination (communications with other health care professionals or caregivers) and documentation in the medical record.

## 2023-10-01 ENCOUNTER — Telehealth: Payer: Self-pay | Admitting: Licensed Clinical Social Worker

## 2023-10-01 ENCOUNTER — Encounter: Payer: Self-pay | Admitting: Hematology and Oncology

## 2023-10-01 NOTE — Telephone Encounter (Signed)
 [facility] Clinical Social Work  Clinical Social Work was referred by Engineer, civil (consulting) for need for [employer].  Clinical Social Worker attempted to contact patient by phone to offer support and assess for needs.   Call unable to be completed to number on file. MyChart message sent to patient.   Interventions: Provided patient with information about resources via MyChart       Follow Up Plan:  Patient will contact CSW with any support or resource needs and CSW will see patient on 8/27 in infusion    Charolette Bultman E Baylea Milburn, LCSW  Clinical Social Worker [facility]        Patient is participating in a Managed Medicaid Plan:  Yes

## 2023-10-06 ENCOUNTER — Inpatient Hospital Stay

## 2023-10-06 VITALS — BP 156/105 | HR 83 | Temp 98.4°F | Resp 18 | Wt 125.2 lb

## 2023-10-06 DIAGNOSIS — C50911 Malignant neoplasm of unspecified site of right female breast: Secondary | ICD-10-CM

## 2023-10-06 DIAGNOSIS — Z5112 Encounter for antineoplastic immunotherapy: Secondary | ICD-10-CM | POA: Diagnosis not present

## 2023-10-06 MED ORDER — ACETAMINOPHEN 325 MG PO TABS
650.0000 mg | ORAL_TABLET | Freq: Once | ORAL | Status: AC
  Administered 2023-10-06 (×2): 650 mg via ORAL
  Filled 2023-10-06: qty 2

## 2023-10-06 MED ORDER — TRASTUZUMAB-ANNS CHEMO 150 MG IV SOLR
6.0000 mg/kg | Freq: Once | INTRAVENOUS | Status: AC
  Administered 2023-10-06 (×2): 357 mg via INTRAVENOUS
  Filled 2023-10-06: qty 17

## 2023-10-06 MED ORDER — SODIUM CHLORIDE 0.9 % IV SOLN: INTRAVENOUS | Status: DC

## 2023-10-06 MED ORDER — DIPHENHYDRAMINE HCL 25 MG PO CAPS
50.0000 mg | ORAL_CAPSULE | Freq: Once | ORAL | Status: AC
  Administered 2023-10-06 (×2): 50 mg via ORAL
  Filled 2023-10-06: qty 2

## 2023-10-06 MED ORDER — SODIUM CHLORIDE 0.9 % IV SOLN
420.0000 mg | Freq: Once | INTRAVENOUS | Status: AC
  Administered 2023-10-06 (×2): 420 mg via INTRAVENOUS
  Filled 2023-10-06: qty 14

## 2023-10-15 ENCOUNTER — Other Ambulatory Visit: Payer: Self-pay | Admitting: Hematology and Oncology

## 2023-10-16 ENCOUNTER — Encounter: Payer: Self-pay | Admitting: Hematology and Oncology

## 2023-10-21 ENCOUNTER — Other Ambulatory Visit: Payer: Self-pay

## 2023-10-22 ENCOUNTER — Inpatient Hospital Stay

## 2023-10-22 ENCOUNTER — Inpatient Hospital Stay: Admitting: Adult Health

## 2023-10-28 ENCOUNTER — Ambulatory Visit (HOSPITAL_COMMUNITY)
Admission: RE | Admit: 2023-10-28 | Discharge: 2023-10-28 | Disposition: A | Discharge status: Home / Self Care | Source: Ambulatory Visit | Attending: Adult Health | Admitting: Adult Health

## 2023-10-28 DIAGNOSIS — C50911 Malignant neoplasm of unspecified site of right female breast: Secondary | ICD-10-CM | POA: Insufficient documentation

## 2023-10-28 LAB — POCT I-STAT CREATININE: Creatinine, Ser: 1.1 mg/dL — ABNORMAL HIGH (ref 0.44–1.00)

## 2023-10-28 MED ORDER — IOHEXOL 300 MG/ML  SOLN
70.0000 mL | Freq: Once | INTRAMUSCULAR | Status: AC | PRN
  Administered 2023-10-28: 70 mL via INTRAVENOUS

## 2023-10-30 ENCOUNTER — Encounter: Payer: Self-pay | Admitting: Adult Health

## 2023-10-30 ENCOUNTER — Inpatient Hospital Stay: Attending: Hematology and Oncology | Admitting: Adult Health

## 2023-10-30 ENCOUNTER — Inpatient Hospital Stay

## 2023-10-30 VITALS — BP 117/76 | HR 55 | Temp 97.4°F | Resp 17 | Wt 120.4 lb

## 2023-10-30 DIAGNOSIS — F17210 Nicotine dependence, cigarettes, uncomplicated: Secondary | ICD-10-CM | POA: Insufficient documentation

## 2023-10-30 DIAGNOSIS — C50911 Malignant neoplasm of unspecified site of right female breast: Secondary | ICD-10-CM

## 2023-10-30 DIAGNOSIS — C7800 Secondary malignant neoplasm of unspecified lung: Secondary | ICD-10-CM | POA: Insufficient documentation

## 2023-10-30 DIAGNOSIS — E559 Vitamin D deficiency, unspecified: Secondary | ICD-10-CM

## 2023-10-30 DIAGNOSIS — C787 Secondary malignant neoplasm of liver and intrahepatic bile duct: Secondary | ICD-10-CM | POA: Insufficient documentation

## 2023-10-30 DIAGNOSIS — Z1722 Progesterone receptor negative status: Secondary | ICD-10-CM | POA: Diagnosis not present

## 2023-10-30 DIAGNOSIS — Z5112 Encounter for antineoplastic immunotherapy: Secondary | ICD-10-CM | POA: Diagnosis present

## 2023-10-30 DIAGNOSIS — Z171 Estrogen receptor negative status [ER-]: Secondary | ICD-10-CM | POA: Diagnosis not present

## 2023-10-30 DIAGNOSIS — Z1731 Human epidermal growth factor receptor 2 positive status: Secondary | ICD-10-CM | POA: Diagnosis not present

## 2023-10-30 DIAGNOSIS — C50311 Malignant neoplasm of lower-inner quadrant of right female breast: Secondary | ICD-10-CM | POA: Diagnosis present

## 2023-10-30 DIAGNOSIS — C7951 Secondary malignant neoplasm of bone: Secondary | ICD-10-CM | POA: Insufficient documentation

## 2023-10-30 LAB — CMP (CANCER CENTER ONLY)
ALT: 10 U/L (ref 0–44)
AST: 12 U/L — ABNORMAL LOW (ref 15–41)
Albumin: 3.3 g/dL — ABNORMAL LOW (ref 3.5–5.0)
Alkaline Phosphatase: 47 U/L (ref 38–126)
Anion gap: 4 — ABNORMAL LOW (ref 5–15)
BUN: 14 mg/dL (ref 6–20)
CO2: 28 mmol/L (ref 22–32)
Calcium: 8.5 mg/dL — ABNORMAL LOW (ref 8.9–10.3)
Chloride: 106 mmol/L (ref 98–111)
Creatinine: 0.82 mg/dL (ref 0.44–1.00)
GFR, Estimated: >60 mL/min (ref >60)
Glucose, Bld: 137 mg/dL — ABNORMAL HIGH (ref 70–99)
Potassium: 3.7 mmol/L (ref 3.5–5.1)
Sodium: 138 mmol/L (ref 135–145)
Total Bilirubin: 0.2 mg/dL (ref 0.0–1.2)
Total Protein: 8.2 g/dL — ABNORMAL HIGH (ref 6.5–8.1)

## 2023-10-30 LAB — CBC WITH DIFFERENTIAL (CANCER CENTER ONLY)
Abs Immature Granulocytes: 0.01 K/uL (ref 0.00–0.07)
Basophils Absolute: 0.1 K/uL (ref 0.0–0.1)
Basophils Relative: 1 %
Eosinophils Absolute: 0.3 K/uL (ref 0.0–0.5)
Eosinophils Relative: 5 %
HCT: 30.9 % — ABNORMAL LOW (ref 36.0–46.0)
Hemoglobin: 10.1 g/dL — ABNORMAL LOW (ref 12.0–15.0)
Immature Granulocytes: 0 %
Lymphocytes Relative: 43 %
Lymphs Abs: 2.2 K/uL (ref 0.7–4.0)
MCH: 31 pg (ref 26.0–34.0)
MCHC: 32.7 g/dL (ref 30.0–36.0)
MCV: 94.8 fL (ref 80.0–100.0)
Monocytes Absolute: 0.5 K/uL (ref 0.1–1.0)
Monocytes Relative: 10 %
Neutro Abs: 2.1 K/uL (ref 1.7–7.7)
Neutrophils Relative %: 41 %
Platelet Count: 290 K/uL (ref 150–400)
RBC: 3.26 MIL/uL — ABNORMAL LOW (ref 3.87–5.11)
RDW: 14.3 % (ref 11.5–15.5)
Smear Review: NORMAL
WBC Count: 5.1 K/uL (ref 4.0–10.5)
nRBC: 0 % (ref 0.0–0.2)

## 2023-10-30 LAB — VITAMIN D 25 HYDROXY (VIT D DEFICIENCY, FRACTURES): Vit D, 25-Hydroxy: 13.18 ng/mL — ABNORMAL LOW (ref 30–100)

## 2023-10-30 LAB — MAGNESIUM: Magnesium: 1.9 mg/dL (ref 1.7–2.4)

## 2023-10-30 MED ORDER — ACETAMINOPHEN 325 MG PO TABS
650.0000 mg | ORAL_TABLET | Freq: Once | ORAL | Status: AC
  Administered 2023-10-30: 650 mg via ORAL
  Filled 2023-10-30: qty 2

## 2023-10-30 MED ORDER — TRASTUZUMAB-ANNS CHEMO 150 MG IV SOLR
6.0000 mg/kg | Freq: Once | INTRAVENOUS | Status: AC
  Administered 2023-10-30: 357 mg via INTRAVENOUS
  Filled 2023-10-30: qty 17

## 2023-10-30 MED ORDER — SODIUM CHLORIDE 0.9 % IV SOLN
420.0000 mg | Freq: Once | INTRAVENOUS | Status: AC
  Administered 2023-10-30: 420 mg via INTRAVENOUS
  Filled 2023-10-30: qty 14

## 2023-10-30 MED ORDER — SODIUM CHLORIDE 0.9 % IV SOLN
INTRAVENOUS | Status: DC
Start: 2023-10-30 — End: 2023-10-30

## 2023-10-30 MED ORDER — DIPHENHYDRAMINE HCL 25 MG PO CAPS
50.0000 mg | ORAL_CAPSULE | Freq: Once | ORAL | Status: AC
  Administered 2023-10-30: 50 mg via ORAL
  Filled 2023-10-30: qty 2

## 2023-10-30 NOTE — Patient Instructions (Signed)
 [facility] - A DEPT OF [place]  Discharge Instructions: Thank you for choosing [facility] to provide your oncology and hematology care.   If you have a lab appointment with the Cancer Center, please go directly to the Cancer Center and check in at the registration area.   Wear comfortable clothing and clothing appropriate for easy access to any Portacath or PICC line.   We strive to give you quality time with your provider. You may need to reschedule your appointment if you arrive late (15 or more minutes).  Arriving late affects you and other patients whose appointments are after yours.  Also, if you miss three or more appointments without notifying the office, you may be dismissed from the clinic at the provider's discretion.      For prescription refill requests, have your pharmacy contact our office and allow 72 hours for refills to be completed.    Today you received the following chemotherapy and/or immunotherapy agents: Trastuzumab, Pertuzumab      To help prevent nausea and vomiting after your treatment, we encourage you to take your nausea medication as directed.  BELOW ARE SYMPTOMS THAT SHOULD BE REPORTED IMMEDIATELY: *FEVER GREATER THAN 100.4 F (38 C) OR HIGHER *CHILLS OR SWEATING *NAUSEA AND VOMITING THAT IS NOT CONTROLLED WITH YOUR NAUSEA MEDICATION *UNUSUAL SHORTNESS OF BREATH *UNUSUAL BRUISING OR BLEEDING *URINARY PROBLEMS (pain or burning when urinating, or frequent urination) *BOWEL PROBLEMS (unusual diarrhea, constipation, pain near the anus) TENDERNESS IN MOUTH AND THROAT WITH OR WITHOUT PRESENCE OF ULCERS (sore throat, sores in mouth, or a toothache) UNUSUAL RASH, SWELLING OR PAIN  UNUSUAL VAGINAL DISCHARGE OR ITCHING   Items with * indicate a potential emergency and should be followed up as soon as possible or go to the Emergency Department if any problems should occur.  Please show the CHEMOTHERAPY ALERT CARD or  IMMUNOTHERAPY ALERT CARD at check-in to the Emergency Department and triage nurse.  Should you have questions after your visit or need to cancel or reschedule your appointment, please contact [facility] - A DEPT OF [facility]  Dept: [phone]  and follow the prompts.  Office hours are 8:00 a.m. to 4:30 p.m. Monday - Friday. Please note that voicemails left after 4:00 p.m. may not be returned until the following business day.  We are closed weekends and major holidays. You have access to a nurse at all times for urgent questions. Please call the main number to the clinic Dept: [phone] and follow the prompts.   For any non-urgent questions, you may also contact your provider using MyChart. We now offer e-Visits for anyone 41 and older to request care online for non-urgent symptoms. For details visit [email or web address].   Also download the MyChart app! Go to the app store, search MyChart, open the app, select [facility], and log in with your MyChart username and password.

## 2023-10-30 NOTE — Progress Notes (Signed)
 Patient declined post pertuzumab  observation.  Tolerated treatment without incident.  Ambulated to lobby.

## 2023-10-30 NOTE — Progress Notes (Signed)
 [facility] Cancer Follow up:    Patient, [name]   DIAGNOSIS:  Cancer Staging  No matching staging information was found for the patient.    SUMMARY OF ONCOLOGIC HISTORY: Oncology History  Breast cancer metastasized to multiple sites (HCC)  06/16/2020 Initial Diagnosis   [place]:  stage IV: T2 N1 M1 ER/PR negative HER2 positive metastatic breast cancer (diagnosed and treated by Dr. Fernand) 08/09/2020 -11/02/2020 6 cycles of THP, on Herceptin  and Perjeta  maintenance Right breast DCIS   01/21/2023 -  Chemotherapy   Patient is on Treatment Plan : BREAST Trastuzumab  + Pertuzumab  q21d       CURRENT THERAPY:  INTERVAL HISTORY:  Discussed the use of AI scribe software for clinical note transcription with the patient, who gave verbal consent to proceed.  History of Present Illness Connie Wells is a 38 year old female with breast cancer who presents for follow-up on her recent scans.  She has breast cancer involving the right breast and lymph nodes, with recent enlargement and tenderness of the lymph nodes which was also noted on CT scans, but there was no cancer elsewhere. She is scheduled for a mammogram and ultrasound for comparison, as she has not had these imaging studies since March of 2024.   A port is in place for treatment. She has a history of metastasis to the lung and liver, but recent imaging did not show any cancer in these areas.  She experiences a sensation similar to a pulled muscle when getting up from lying down. There is no chest pain, palpitations, or shortness of breath, although she sometimes experiences generalized discomfort.  She is concerned about random bruises and wonders if they might be related to low vitamin D  levels.  Patient Active Problem List   Diagnosis Date Noted   Pneumococcal bacteremia 03/24/2023   Sepsis due to pneumonia (HCC) 03/19/2023   AKI (acute kidney injury) (HCC) 03/19/2023   History of  breast cancer 03/19/2023   GAD (generalized anxiety disorder) 03/19/2023   CAP (community acquired pneumonia) 03/19/2023   Breast cancer metastasized to multiple sites (HCC) 08/21/2022   Cellulitis of pharynx    Pharyngitis 04/20/2015   Postpartum care and examination 12/27/2013   Contraception management 12/27/2013   GBS bacteriuria 10/14/2013   Drug use complicating pregnancy in second trimester 07/06/2013   Hx of pyelonephritis 06/08/2013   HSV-2 infection complicating pregnancy 06/08/2013   SOLITARY KIDNEY 05/28/2006   ANA POSITIVE, HX OF 05/02/2006    has no known allergies.  MEDICAL HISTORY: Past Medical History:  Diagnosis Date   Herpes genitalia    Solitary kidney     SURGICAL HISTORY: Past Surgical History:  Procedure Laterality Date   DILATION AND EVACUATION     ECTOPIC PREGNANCY SURGERY     TRANSESOPHAGEAL ECHOCARDIOGRAM (CATH LAB) N/A 03/25/2023   Procedure: TRANSESOPHAGEAL ECHOCARDIOGRAM;  Surgeon: Raford Riggs, MD;  Location: [facility] INVASIVE CV LAB;  Service: Cardiovascular;  Laterality: N/A;    SOCIAL HISTORY: Social History   Socioeconomic History   Marital status: Single    Spouse name: Not on file   Number of children: Not on file   Years of education: Not on file   Highest education level: Not on file  Occupational History   Not on file  Tobacco Use   Smoking status: Every Day    Current packs/day: 0.50    Average packs/day: 0.5 packs/day for 5.0 years (2.5 ttl pk-yrs)  Types: Cigarettes   Smokeless tobacco: Never  Substance and Sexual Activity   Alcohol use: No    Comment: Occassional   Drug use: Yes    Types: Marijuana   Sexual activity: Yes    Birth control/protection: None  Other Topics Concern   Not on file  Social History Narrative   Not on file   Social Drivers of Health   Financial Resource Strain: Not on file  Food Insecurity: No Food Insecurity (03/20/2023)   Hunger Vital Sign    Worried About Running Out of Food in the  Last Year: Never true    Ran Out of Food in the Last Year: Never true  Recent Concern: Food Insecurity - Food Insecurity Present (01/21/2023)   Hunger Vital Sign    Worried About Running Out of Food in the Last Year: Sometimes true    Ran Out of Food in the Last Year: Sometimes true  Transportation Needs: No Transportation Needs (03/19/2023)   PRAPARE - Administrator, Civil Service (Medical): No    Lack of Transportation (Non-Medical): No  Recent Concern: Transportation Needs - Unmet Transportation Needs (01/21/2023)   PRAPARE - Administrator, Civil Service (Medical): Yes    Lack of Transportation (Non-Medical): No  Physical Activity: Not on file  Stress: Not on file  Social Connections: Unknown (03/19/2023)   Social Connection and Isolation Panel    Frequency of Communication with Friends and Family: More than three times a week    Frequency of Social Gatherings with Friends and Family: More than three times a week    Attends Religious Services: Patient declined    Database administrator or Organizations: Yes    Attends Banker Meetings: Patient declined    Marital Status: Not on file  Intimate Partner Violence: Not At Risk (03/19/2023)   Humiliation, Afraid, Rape, and Kick questionnaire    Fear of Current or Ex-Partner: No    Emotionally Abused: No    Physically Abused: No    Sexually Abused: No    FAMILY HISTORY: Family History  Problem Relation Age of Onset   Diabetes Mother    Hypertension Mother    Diabetes Maternal Aunt    Asthma Brother     Review of Systems  Constitutional:  Negative for appetite change, chills, fatigue, fever and unexpected weight change.  HENT:   Negative for hearing loss, lump/mass and trouble swallowing.   Eyes:  Negative for eye problems and icterus.  Respiratory:  Negative for chest tightness, cough and shortness of breath.   Cardiovascular:  Negative for chest pain, leg swelling and palpitations.   Gastrointestinal:  Negative for abdominal distention, abdominal pain, constipation, diarrhea, nausea and vomiting.  Endocrine: Negative for hot flashes.  Genitourinary:  Negative for difficulty urinating.   Musculoskeletal:  Negative for arthralgias.  Skin:  Negative for itching and rash.  Neurological:  Negative for dizziness, extremity weakness, headaches and numbness.  Hematological:  Negative for adenopathy. Does not bruise/bleed easily.  Psychiatric/Behavioral:  Negative for depression. The patient is not nervous/anxious.       PHYSICAL EXAMINATION    Vitals:   10/30/23 1405  BP: 117/76  Pulse: (!) 55  Resp: 17  Temp: (!) 97.4 F (36.3 C)  SpO2: 97%    Physical Exam Constitutional:      General: She is not in acute distress.    Appearance: Normal appearance. She is not toxic-appearing.  HENT:     Head: Normocephalic  and atraumatic.     Mouth/Throat:     Mouth: Mucous membranes are moist.     Pharynx: Oropharynx is clear. No oropharyngeal exudate or posterior oropharyngeal erythema.  Eyes:     General: No scleral icterus. Cardiovascular:     Rate and Rhythm: Normal rate and regular rhythm.     Pulses: Normal pulses.     Heart sounds: Normal heart sounds.  Pulmonary:     Effort: Pulmonary effort is normal.     Breath sounds: Normal breath sounds.  Abdominal:     General: Abdomen is flat. Bowel sounds are normal. There is no distension.     Palpations: Abdomen is soft.     Tenderness: There is no abdominal tenderness.  Musculoskeletal:        General: No swelling.     Cervical back: Neck supple.  Lymphadenopathy:     Cervical: No cervical adenopathy.  Skin:    General: Skin is warm and dry.     Findings: No rash.  Neurological:     General: No focal deficit present.     Mental Status: She is alert.  Psychiatric:        Mood and Affect: Mood normal.        Behavior: Behavior normal.     LABORATORY DATA:  CBC    Component Value Date/Time   WBC  5.1 10/30/2023 1440   WBC 7.9 03/23/2023 1220   RBC 3.26 (L) 10/30/2023 1440   HGB 10.1 (L) 10/30/2023 1440   HCT 30.9 (L) 10/30/2023 1440   PLT 290 10/30/2023 1440   MCV 94.8 10/30/2023 1440   MCH 31.0 10/30/2023 1440   MCHC 32.7 10/30/2023 1440   RDW 14.3 10/30/2023 1440   LYMPHSABS 2.2 10/30/2023 1440   MONOABS 0.5 10/30/2023 1440   EOSABS 0.3 10/30/2023 1440   BASOSABS 0.1 10/30/2023 1440    CMP     Component Value Date/Time   NA 138 10/30/2023 1440   K 3.7 10/30/2023 1440   CL 106 10/30/2023 1440   CO2 28 10/30/2023 1440   GLUCOSE 137 (H) 10/30/2023 1440   BUN 14 10/30/2023 1440   CREATININE 0.82 10/30/2023 1440   CREATININE 0.58 07/06/2013 1612   CALCIUM  8.5 (L) 10/30/2023 1440   PROT 8.2 (H) 10/30/2023 1440   ALBUMIN  3.3 (L) 10/30/2023 1440   AST 12 (L) 10/30/2023 1440   ALT 10 10/30/2023 1440   ALKPHOS 47 10/30/2023 1440   BILITOT 0.2 10/30/2023 1440   GFRNONAA >60 10/30/2023 1440   GFRAA >60 04/21/2015 0422     ASSESSMENT and THERAPY PLAN:   Breast cancer metastasized to multiple sites (HCC) 06/16/2020 stage IV: T2 N1 M1 ER/PR negative HER2 positive metastatic breast cancer (diagnosed and treated by Dr. Fernand) 08/09/2020 -11/02/2020 6 cycles of THP, on Herceptin  and Perjeta  maintenance Right breast DCIS Current treatment: Herceptin  Perjeta  maintenance since 12/14/2020 (discontinued November 2023 after she left Dr. Kathern clinic) resumed 04/16/2023   CT CAP 03/18/2023: New dense consolidation right lower lobe, minimal spillover to right middle lobe favoring bacterial pneumonia versus pulmonary hemorrhage, substantial improvement in the right breast lesions and right axillary lymph nodes, left adrenal mass  2 cm: Benign   Hospitalization 03/18/2023-03/25/2023: Pneumococcal bacteremia and sepsis   Treatment plan: Resumed Herceptin  and Perjeta  Echocardiogram completed 01/20/2023: Echo by Dr. Cherrie 01/20/23 EF 60-65%, GLS -16.9%  Echo 03/21/2023: 45 to 50% (Dr.  Cherrie reviewed it and felt it was more 50 to 55% and that he was fine  for us  to proceed with her treatment)  08/15/2023: CT CAP: Right breast lesions, minimal improvement right axillary lymph nodes, left axillary lymph nodes unchanged, no evidence of progression.  Left adrenal nodule, absent right kidney  Assessment and Plan Assessment & Plan Malignant neoplasm of right breast with lymph node involvement Cancer localized to right breast and lymph nodes, no metastasis to liver or lungs. Lymph nodes larger, further imaging needed. Undergoing Herceptin  and Perjeta  treatment. Reviewed recent scans with Dr Odean who reviewed them and helped formulate assessment and plan as below.  - Administer Herceptin  and Perjeta  infusion today. - Order mammogram and ultrasound of right breast. - Compare new imaging with previous mammo and ultrasoundfilms from March 2024. - RTC to discuss findings with Dr. Odean on November 19, 2023.  Vitamin D  deficiency Reports random bruising, possibly linked to vitamin D  deficiency. Lab work overdue. - Order lab tests to check vitamin D  levels.         All questions were answered. The patient knows to call the clinic with any problems, questions or concerns. We can certainly see the patient much sooner if necessary.  Total encounter time:20 minutes*in face-to-face visit time, chart review, lab review, care coordination, order entry, and documentation of the encounter time.    Morna Kendall, NP 11/02/23 11:29 PM Medical Oncology and Hematology [facility] [address] [place], [place] [address] Tel. [phone]    Fax. [phone]  *Total Encounter Time as defined by the Centers for Medicare and Medicaid Services includes, in addition to the face-to-face time of a patient visit (documented in the note above) non-face-to-face time: obtaining and reviewing outside history, ordering and reviewing medications, tests or procedures, care  coordination (communications with other health care professionals or caregivers) and documentation in the medical record.

## 2023-11-02 ENCOUNTER — Encounter: Payer: Self-pay | Admitting: Hematology and Oncology

## 2023-11-02 NOTE — Assessment & Plan Note (Signed)
 06/16/2020 stage IV: T2 N1 M1 ER/PR negative HER2 positive metastatic breast cancer (diagnosed and treated by Dr. Fernand) 08/09/2020 -11/02/2020 6 cycles of THP, on Herceptin  and Perjeta  maintenance Right breast DCIS Current treatment: Herceptin  Perjeta  maintenance since 12/14/2020 (discontinued November 2023 after she left Dr. Kathern clinic) resumed 04/16/2023   CT CAP 03/18/2023: New dense consolidation right lower lobe, minimal spillover to right middle lobe favoring bacterial pneumonia versus pulmonary hemorrhage, substantial improvement in the right breast lesions and right axillary lymph nodes, left adrenal mass  2 cm: Benign   Hospitalization 03/18/2023-03/25/2023: Pneumococcal bacteremia and sepsis   Treatment plan: Resumed Herceptin  and Perjeta  Echocardiogram completed 01/20/2023: Echo by Dr. Cherrie 01/20/23 EF 60-65%, GLS -16.9%  Echo 03/21/2023: 45 to 50% (Dr. Cherrie reviewed it and felt it was more 50 to 55% and that he was fine for us  to proceed with her treatment)  08/15/2023: CT CAP: Right breast lesions, minimal improvement right axillary lymph nodes, left axillary lymph nodes unchanged, no evidence of progression.  Left adrenal nodule, absent right kidney  Assessment and Plan Assessment & Plan Malignant neoplasm of right breast with lymph node involvement Cancer localized to right breast and lymph nodes, no metastasis to liver or lungs. Lymph nodes larger, further imaging needed. Undergoing Herceptin  and Perjeta  treatment. Reviewed recent scans with Dr Odean who reviewed them and helped formulate assessment and plan as below.  - Administer Herceptin  and Perjeta  infusion today. - Order mammogram and ultrasound of right breast. - Compare new imaging with previous mammo and ultrasoundfilms from March 2024. - RTC to discuss findings with Dr. Odean on November 19, 2023.  Vitamin D  deficiency Reports random bruising, possibly linked to vitamin D  deficiency. Lab work overdue. - Order  lab tests to check vitamin D  levels.

## 2023-11-03 ENCOUNTER — Other Ambulatory Visit: Payer: Self-pay

## 2023-11-03 ENCOUNTER — Other Ambulatory Visit: Payer: Self-pay | Admitting: Adult Health

## 2023-11-03 ENCOUNTER — Ambulatory Visit: Payer: Self-pay

## 2023-11-03 DIAGNOSIS — C50911 Malignant neoplasm of unspecified site of right female breast: Secondary | ICD-10-CM

## 2023-11-03 DIAGNOSIS — E559 Vitamin D deficiency, unspecified: Secondary | ICD-10-CM

## 2023-11-03 MED ORDER — ERGOCALCIFEROL 1.25 MG (50000 UT) PO CAPS: 50000.0000 [IU] | ORAL_CAPSULE | ORAL | 1 refills | Status: DC

## 2023-11-04 ENCOUNTER — Telehealth (HOSPITAL_COMMUNITY): Payer: Self-pay | Admitting: Vascular Surgery

## 2023-11-04 NOTE — Telephone Encounter (Signed)
 Lvm to make brst cancer appt w/ DB in OCT

## 2023-11-11 ENCOUNTER — Inpatient Hospital Stay

## 2023-11-11 ENCOUNTER — Inpatient Hospital Stay: Admitting: Hematology and Oncology

## 2023-11-14 ENCOUNTER — Other Ambulatory Visit: Payer: Self-pay

## 2023-11-18 ENCOUNTER — Telehealth (HOSPITAL_COMMUNITY): Payer: Self-pay | Admitting: Vascular Surgery

## 2023-11-18 NOTE — Telephone Encounter (Signed)
 2 nd attempt Lvm to make f/u appt w/ db

## 2023-11-19 ENCOUNTER — Inpatient Hospital Stay

## 2023-11-19 ENCOUNTER — Inpatient Hospital Stay: Admitting: Hematology and Oncology

## 2023-11-19 NOTE — Assessment & Plan Note (Deleted)
 06/16/2020 stage IV: T2 N1 M1 ER/PR negative HER2 positive metastatic breast cancer (diagnosed and treated by Dr. Fernand) 08/09/2020 -11/02/2020 6 cycles of THP, on Herceptin  and Perjeta  maintenance Right breast DCIS Current treatment: Herceptin  Perjeta  maintenance since 12/14/2020 (discontinued November 2023 after she left Dr. Kathern clinic) resumed 04/16/2023   CT CAP 03/18/2023: New dense consolidation right lower lobe, minimal spillover to right middle lobe favoring bacterial pneumonia versus pulmonary hemorrhage, substantial improvement in the right breast lesions and right axillary lymph nodes, left adrenal mass  2 cm: Benign   Hospitalization 03/18/2023-03/25/2023: Pneumococcal bacteremia and sepsis   Treatment plan: Resumed Herceptin  and Perjeta  Echocardiogram completed 01/20/2023: Echo by Dr. Cherrie 01/20/23 EF 60-65%, GLS -16.9%  Echo 03/21/2023: 45 to 50% (Dr. Cherrie reviewed it and felt it was more 50 to 55% and that he was fine for us  to proceed with her treatment)   08/15/2023: CT CAP: Right breast lesions, minimal improvement right axillary lymph nodes, left axillary lymph nodes unchanged, no evidence of progression.  Left adrenal nodule, absent right kidney  10/30/2023: CT CAP: 3 masses in the right breast/chest wall increased in size (3.1 cm is similar to prior, 2.3 cm was 1.5 cm, 1.3 cm was 1.1 cm), interval enlargement of right axillary lymph node, no evidence of distant metastatic disease  Radiology counseling: I discussed with her that there has been a slight increase in the size by the CT scans.  The increase in size is fairly minimal.    Return to clinic every 3 weeks for Herceptin  Perjeta 

## 2023-11-26 ENCOUNTER — Other Ambulatory Visit: Payer: Self-pay

## 2023-11-29 ENCOUNTER — Ambulatory Visit (HOSPITAL_COMMUNITY): Admission: EM | Admit: 2023-11-29 | Discharge: 2023-11-29 | Disposition: A | Discharge status: Home / Self Care

## 2023-11-29 DIAGNOSIS — L03011 Cellulitis of right finger: Secondary | ICD-10-CM | POA: Diagnosis not present

## 2023-11-29 NOTE — Discharge Instructions (Addendum)
 Soak finger in warm water 20 minutes at a time a couple times a day.  Be sure to complete antibiotics in their entirety.

## 2023-11-29 NOTE — ED Provider Notes (Signed)
 [facility]    CSN: [id number] Arrival date & time: 11/29/23  1336      History   Chief Complaint Chief Complaint  Patient presents with   Finger Injury    HPI Connie Wells is a 38 y.o. female.   Patient presents today due to 3 days worth of painful right index finger.  Patient denies fingernail biting but admits to picking around her cuticles.     Past Medical History:  Diagnosis Date   Herpes genitalia    Solitary kidney     Patient Active Problem List   Diagnosis Date Noted   Pneumococcal bacteremia 03/24/2023   Sepsis due to pneumonia (HCC) 03/19/2023   AKI (acute kidney injury) 03/19/2023   History of breast cancer 03/19/2023   GAD (generalized anxiety disorder) 03/19/2023   CAP (community acquired pneumonia) 03/19/2023   Breast cancer metastasized to multiple sites (HCC) 08/21/2022   Cellulitis of pharynx    Pharyngitis 04/20/2015   Postpartum care and examination 12/27/2013   Contraception management 12/27/2013   GBS bacteriuria 10/14/2013   Drug use complicating pregnancy in second trimester 07/06/2013   Hx of pyelonephritis 06/08/2013   HSV-2 infection complicating pregnancy 06/08/2013   SOLITARY KIDNEY 05/28/2006   ANA POSITIVE, HX OF 05/02/2006    Past Surgical History:  Procedure Laterality Date   DILATION AND EVACUATION     ECTOPIC PREGNANCY SURGERY     TRANSESOPHAGEAL ECHOCARDIOGRAM (CATH LAB) N/A 03/25/2023   Procedure: TRANSESOPHAGEAL ECHOCARDIOGRAM;  Surgeon: Raford Riggs, MD;  Location: [facility] INVASIVE CV LAB;  Service: Cardiovascular;  Laterality: N/A;    OB History     Gravida  4   Para  2   Term  1   Preterm  1   AB  2   Living  2      SAB  0   IAB  1   Ectopic  1   Multiple  0   Live Births  2            Home Medications    Prior to Admission medications   Medication Sig Start Date End Date Taking? Authorizing Provider  ergocalciferol  (VITAMIN D2) 1.25 MG (50000 UT) capsule Take 1  capsule (50,000 Units total) by mouth once a week. 11/03/23   Crawford Morna Pickle, NP  folic acid  (FOLVITE ) 1 MG tablet Take 1 tablet (1 mg total) by mouth daily. 03/26/23   Regalado, Belkys A, MD  losartan  (COZAAR ) 25 MG tablet Take 1 tablet (25 mg total) by mouth daily. 08/06/23   Colletta Manuelita Garre, PA-C  metoprolol  succinate (TOPROL  XL) 25 MG 24 hr tablet Take 1 tablet (25 mg total) by mouth daily. 08/06/23   Colletta Manuelita Garre, PA-C    Family History Family History  Problem Relation Age of Onset   Diabetes Mother    Hypertension Mother    Diabetes Maternal Aunt    Asthma Brother     Social History Social History   Tobacco Use   Smoking status: Every Day    Current packs/day: 0.50    Average packs/day: 0.5 packs/day for 5.0 years (2.5 ttl pk-yrs)    Types: Cigarettes   Smokeless tobacco: Never  Substance Use Topics   Alcohol use: No    Comment: Occassional   Drug use: Yes    Types: Marijuana     Allergies   Patient has no known allergies.   Review of Systems Review of Systems   Physical Exam Triage Vital Signs  ED Triage Vitals  Encounter Vitals Group     BP 11/29/23 1430 130/82     Girls Systolic BP Percentile --      Girls Diastolic BP Percentile --      Boys Systolic BP Percentile --      Boys Diastolic BP Percentile --      Pulse Rate 11/29/23 1430 100     Resp 11/29/23 1430 16     Temp 11/29/23 1430 99.1 F (37.3 C)     Temp Source 11/29/23 1430 Oral     SpO2 11/29/23 1430 98 %     Weight --      Height --      Head Circumference --      Peak Flow --      Pain Score 11/29/23 1431 10     Pain Loc --      Pain Education --      Exclude from Growth Chart --    No data found.  Updated Vital Signs BP 130/82 (BP Location: Left Arm)   Pulse 100   Temp 99.1 F (37.3 C) (Oral)   Resp 16   SpO2 98%   Visual Acuity Right Eye Distance:   Left Eye Distance:   Bilateral Distance:    Right Eye Near:   Left Eye Near:    Bilateral Near:      Physical Exam Vitals and nursing note reviewed.  Constitutional:      General: She is not in acute distress.    Appearance: Normal appearance. She is not ill-appearing, toxic-appearing or diaphoretic.  Eyes:     General: No scleral icterus. Cardiovascular:     Rate and Rhythm: Normal rate and regular rhythm.     Heart sounds: Normal heart sounds.  Pulmonary:     Effort: Pulmonary effort is normal. No respiratory distress.     Breath sounds: Normal breath sounds. No wheezing or rhonchi.  Skin:    General: Skin is warm.     Comments: Inflamed paronychia right index finger, visible purulent drainage underneath skin  Neurological:     Mental Status: She is alert and oriented to person, place, and time.  Psychiatric:        Mood and Affect: Mood normal.        Behavior: Behavior normal.      UC Treatments / Results  Labs (all labs ordered are listed, but only abnormal results are displayed) Labs Reviewed  AEROBIC CULTURE W GRAM STAIN (SUPERFICIAL SPECIMEN)    EKG   Radiology No results found.  Procedures Incision and Drainage  Date/Time: 11/29/2023 2:49 PM  Performed by: Andra Corean BROCKS, PA-C Authorized by: Andra Corean BROCKS, PA-C   Consent:    Consent obtained:  Verbal   Consent given by:  Patient   Risks discussed:  Incomplete drainage and infection   Alternatives discussed:  No treatment Universal protocol:    Procedure explained and questions answered to patient or proxy's satisfaction: yes     Patient identity confirmed:  Verbally with patient Location:    Type:  Abscess Anesthesia:    Anesthesia method:  Topical application   Topical anesthesia: pain eze spray. Procedure type:    Complexity:  Simple Procedure details:    Incision types:  Single straight   Incision depth:  Subcutaneous   Drainage:  Purulent   Drainage amount:  Scant Post-procedure details:    Procedure completion:  Tolerated well, no immediate complications  (including  critical care time)  Medications Ordered in UC Medications - No data to display  Initial Impression / Assessment and Plan / UC Course  I have reviewed the triage vital signs and the nursing notes.  Pertinent labs & imaging results that were available during my care of the patient were reviewed by me and considered in my medical decision making (see chart for details).     Paronychia of right index finger-incision and drainage of right index finger performed, sample sent to lab of purulent drainage, patient was given Augmentin  875-125 twice daily for 10 days. Final Clinical Impressions(s) / UC Diagnoses   Final diagnoses:  Paronychia of finger of right hand     Discharge Instructions      Soak finger in warm water 20 minutes at a time a couple times a day.  Be sure to complete antibiotics in their entirety.   ED Prescriptions   None    PDMP not reviewed this encounter.   Andra Corean BROCKS, PA-C 11/29/23 1452

## 2023-11-29 NOTE — ED Triage Notes (Signed)
 Right pointer finger pain and swelling started 3 days ago.

## 2023-12-01 ENCOUNTER — Ambulatory Visit (HOSPITAL_COMMUNITY): Payer: Self-pay

## 2023-12-01 LAB — AEROBIC CULTURE W GRAM STAIN (SUPERFICIAL SPECIMEN)

## 2023-12-04 ENCOUNTER — Ambulatory Visit
Admission: RE | Admit: 2023-12-04 | Discharge: 2023-12-04 | Disposition: A | Discharge status: Home / Self Care | Source: Ambulatory Visit | Attending: Adult Health | Admitting: Adult Health

## 2023-12-04 ENCOUNTER — Other Ambulatory Visit

## 2023-12-04 DIAGNOSIS — C50911 Malignant neoplasm of unspecified site of right female breast: Secondary | ICD-10-CM

## 2023-12-05 ENCOUNTER — Encounter: Payer: Self-pay | Admitting: Hematology and Oncology

## 2023-12-09 ENCOUNTER — Inpatient Hospital Stay

## 2023-12-09 ENCOUNTER — Encounter: Payer: Self-pay | Admitting: Hematology and Oncology

## 2023-12-09 ENCOUNTER — Inpatient Hospital Stay: Attending: Hematology and Oncology | Admitting: Hematology and Oncology

## 2023-12-09 VITALS — BP 124/84 | HR 72 | Temp 97.7°F | Resp 18 | Wt 129.6 lb

## 2023-12-09 DIAGNOSIS — Z79899 Other long term (current) drug therapy: Secondary | ICD-10-CM | POA: Diagnosis not present

## 2023-12-09 DIAGNOSIS — C50911 Malignant neoplasm of unspecified site of right female breast: Secondary | ICD-10-CM

## 2023-12-09 DIAGNOSIS — Z1731 Human epidermal growth factor receptor 2 positive status: Secondary | ICD-10-CM | POA: Insufficient documentation

## 2023-12-09 DIAGNOSIS — Z5112 Encounter for antineoplastic immunotherapy: Secondary | ICD-10-CM | POA: Diagnosis present

## 2023-12-09 DIAGNOSIS — C787 Secondary malignant neoplasm of liver and intrahepatic bile duct: Secondary | ICD-10-CM | POA: Insufficient documentation

## 2023-12-09 DIAGNOSIS — Z171 Estrogen receptor negative status [ER-]: Secondary | ICD-10-CM | POA: Insufficient documentation

## 2023-12-09 DIAGNOSIS — C7951 Secondary malignant neoplasm of bone: Secondary | ICD-10-CM | POA: Insufficient documentation

## 2023-12-09 DIAGNOSIS — C7800 Secondary malignant neoplasm of unspecified lung: Secondary | ICD-10-CM | POA: Insufficient documentation

## 2023-12-09 DIAGNOSIS — E559 Vitamin D deficiency, unspecified: Secondary | ICD-10-CM

## 2023-12-09 DIAGNOSIS — Z1722 Progesterone receptor negative status: Secondary | ICD-10-CM | POA: Insufficient documentation

## 2023-12-09 DIAGNOSIS — C50311 Malignant neoplasm of lower-inner quadrant of right female breast: Secondary | ICD-10-CM | POA: Diagnosis present

## 2023-12-09 LAB — CBC WITH DIFFERENTIAL (CANCER CENTER ONLY)
Abs Immature Granulocytes: 0.01 K/uL (ref 0.00–0.07)
Basophils Absolute: 0 K/uL (ref 0.0–0.1)
Basophils Relative: 1 %
Eosinophils Absolute: 0.1 K/uL (ref 0.0–0.5)
Eosinophils Relative: 2 %
HCT: 33.5 % — ABNORMAL LOW (ref 36.0–46.0)
Hemoglobin: 10.9 g/dL — ABNORMAL LOW (ref 12.0–15.0)
Immature Granulocytes: 0 %
Lymphocytes Relative: 35 %
Lymphs Abs: 2.1 K/uL (ref 0.7–4.0)
MCH: 31.3 pg (ref 26.0–34.0)
MCHC: 32.5 g/dL (ref 30.0–36.0)
MCV: 96.3 fL (ref 80.0–100.0)
Monocytes Absolute: 0.5 K/uL (ref 0.1–1.0)
Monocytes Relative: 8 %
Neutro Abs: 3.2 K/uL (ref 1.7–7.7)
Neutrophils Relative %: 54 %
Platelet Count: 363 K/uL (ref 150–400)
RBC: 3.48 MIL/uL — ABNORMAL LOW (ref 3.87–5.11)
RDW: 15.2 % (ref 11.5–15.5)
WBC Count: 6 K/uL (ref 4.0–10.5)
nRBC: 0 % (ref 0.0–0.2)

## 2023-12-09 LAB — MAGNESIUM: Magnesium: 1.9 mg/dL (ref 1.7–2.4)

## 2023-12-09 LAB — CMP (CANCER CENTER ONLY)
ALT: 7 U/L (ref 0–44)
AST: 10 U/L — ABNORMAL LOW (ref 15–41)
Albumin: 3.3 g/dL — ABNORMAL LOW (ref 3.5–5.0)
Alkaline Phosphatase: 50 U/L (ref 38–126)
Anion gap: 3 — ABNORMAL LOW (ref 5–15)
BUN: 23 mg/dL — ABNORMAL HIGH (ref 6–20)
CO2: 30 mmol/L (ref 22–32)
Calcium: 9.4 mg/dL (ref 8.9–10.3)
Chloride: 104 mmol/L (ref 98–111)
Creatinine: 0.94 mg/dL (ref 0.44–1.00)
GFR, Estimated: >60 mL/min (ref >60)
Glucose, Bld: 101 mg/dL — ABNORMAL HIGH (ref 70–99)
Potassium: 4.9 mmol/L (ref 3.5–5.1)
Sodium: 137 mmol/L (ref 135–145)
Total Bilirubin: 0.2 mg/dL (ref 0.0–1.2)
Total Protein: 8 g/dL (ref 6.5–8.1)

## 2023-12-09 LAB — PREGNANCY, URINE: Preg Test, Ur: NEGATIVE

## 2023-12-09 LAB — VITAMIN D 25 HYDROXY (VIT D DEFICIENCY, FRACTURES): Vit D, 25-Hydroxy: 28.84 ng/mL — ABNORMAL LOW (ref 30–100)

## 2023-12-09 MED ORDER — SODIUM CHLORIDE 0.9 % IV SOLN: INTRAVENOUS | Status: DC

## 2023-12-09 MED ORDER — ACETAMINOPHEN 325 MG PO TABS
650.0000 mg | ORAL_TABLET | Freq: Once | ORAL | Status: AC
  Administered 2023-12-09: 650 mg via ORAL
  Filled 2023-12-09: qty 2

## 2023-12-09 MED ORDER — TRASTUZUMAB-ANNS CHEMO 150 MG IV SOLR
6.0000 mg/kg | Freq: Once | INTRAVENOUS | Status: AC
  Administered 2023-12-09: 357 mg via INTRAVENOUS
  Filled 2023-12-09: qty 17

## 2023-12-09 MED ORDER — DIPHENHYDRAMINE HCL 25 MG PO CAPS
50.0000 mg | ORAL_CAPSULE | Freq: Once | ORAL | Status: AC
  Administered 2023-12-09: 50 mg via ORAL
  Filled 2023-12-09: qty 2

## 2023-12-09 MED ORDER — SODIUM CHLORIDE 0.9 % IV SOLN
420.0000 mg | Freq: Once | INTRAVENOUS | Status: AC
  Administered 2023-12-09: 420 mg via INTRAVENOUS
  Filled 2023-12-09: qty 14

## 2023-12-09 NOTE — Assessment & Plan Note (Signed)
 06/16/2020 stage IV: T2 N1 M1 ER/PR negative HER2 positive metastatic breast cancer (diagnosed and treated by Dr. Fernand) 08/09/2020 -11/02/2020 6 cycles of THP, on Herceptin  and Perjeta  maintenance Right breast DCIS Current treatment: Herceptin  Perjeta  maintenance since 12/14/2020 (discontinued November 2023 after she left Dr. Kathern clinic) resumed 04/16/2023   CT CAP 03/18/2023: New dense consolidation right lower lobe, minimal spillover to right middle lobe favoring bacterial pneumonia versus pulmonary hemorrhage, substantial improvement in the right breast lesions and right axillary lymph nodes, left adrenal mass  2 cm: Benign   Hospitalization 03/18/2023-03/25/2023: Pneumococcal bacteremia and sepsis   Treatment plan: Resumed Herceptin  and Perjeta  Echocardiogram completed 01/20/2023: Echo by Dr. Cherrie 01/20/23 EF 60-65%, GLS -16.9%  Echo 03/21/2023: 45 to 50% (Dr. Cherrie reviewed it and felt it was more 50 to 55% and that he was fine for us  to proceed with her treatment)   08/15/2023: CT CAP: Right breast lesions, minimal improvement right axillary lymph nodes, left axillary lymph nodes unchanged, no evidence of progression.  Left adrenal nodule, absent right kidney  Mammogram and ultrasound have been ordered for 12/23/2023

## 2023-12-09 NOTE — Progress Notes (Signed)
 Per Dr Odean, no reloads needed on 12/09/23 for HER2 txs.  Terriona Horlacher, Pharm.D., CPP 12/09/2023@8 :50 AM

## 2023-12-09 NOTE — Progress Notes (Signed)
 Patient Care Team: Patient, No Pcp Per as PCP - General (General Practice)  DIAGNOSIS:  Encounter Diagnosis  Name Primary?   Carcinoma of right breast metastatic to multiple sites [facility]) Yes    SUMMARY OF ONCOLOGIC HISTORY: Oncology History  Breast cancer metastasized to multiple sites (HCC)  06/16/2020 Initial Diagnosis   [place]:  stage IV: T2 N1 M1 ER/PR negative HER2 positive metastatic breast cancer (diagnosed and treated by Dr. Fernand) 08/09/2020 -11/02/2020 6 cycles of THP, on Herceptin  and Perjeta  maintenance Right breast DCIS   01/21/2023 -  Chemotherapy   Patient is on Treatment Plan : BREAST Trastuzumab  + Pertuzumab  q21d       CHIEF COMPLIANT: Follow-up on Herceptin   HISTORY OF PRESENT ILLNESS:  History of Present Illness Connie Wells is a 38 year old female with breast cancer who presents with progression of breast masses and associated symptoms. She was referred by Dr. Fernand for ongoing management of her breast cancer.  She has noticed an increase in the size of three masses in the right breast, which are palpable and cause pain and tenderness, especially around the nipple area. The pain is significant when lifting her arm. A recent scan showed one mass increased from 1.5 to 2.3 cm, and another from 1.1 to 1.3 cm.  She experienced a recent infection with symptoms of swelling and pus on the right side of her body, including her toe, which has a loose toenail. No recent trauma to the toe is noted.  She experiences fatigue and muscle tightness, particularly in her legs, which sometimes require her to take breaks when walking. Episodes of muscle 'lock up' affect her ability to move normally.  She reports a breakout on her right side, which is painful and coincides with her menstrual cycle. She expresses concern about the persistent nature of these symptoms.     ALLERGIES:  has no known allergies.  MEDICATIONS:  Current Outpatient Medications  Medication Sig  Dispense Refill   ergocalciferol  (VITAMIN D2) 1.25 MG (50000 UT) capsule Take 1 capsule (50,000 Units total) by mouth once a week. (Patient not taking: Reported on 12/09/2023) 12 capsule 1   folic acid  (FOLVITE ) 1 MG tablet Take 1 tablet (1 mg total) by mouth daily. (Patient not taking: Reported on 12/09/2023) 30 tablet 0   losartan  (COZAAR ) 25 MG tablet Take 1 tablet (25 mg total) by mouth daily. (Patient not taking: Reported on 12/09/2023) 90 tablet 3   metoprolol  succinate (TOPROL  XL) 25 MG 24 hr tablet Take 1 tablet (25 mg total) by mouth daily. (Patient not taking: Reported on 12/09/2023) 90 tablet 3   No current facility-administered medications for this visit.   Facility-Administered Medications Ordered in Other Visits  Medication Dose Route Frequency Provider Last Rate Last Admin   0.9 %  sodium chloride  infusion   Intravenous Continuous Maylie Ashton, MD 10 mL/hr at 12/09/23 1506 New Bag at 12/09/23 1506   pertuzumab  (PERJETA ) 420 mg in sodium chloride  0.9 % 250 mL chemo infusion  420 mg Intravenous Once Konnor Vondrasek, MD       trastuzumab -anns (KANJINTI ) 357 mg in sodium chloride  0.9 % 250 mL chemo infusion  6 mg/kg (Treatment Plan Recorded) Intravenous Once Gaylia Kassel, MD 534 mL/hr at 12/09/23 1608 357 mg at 12/09/23 1608    PHYSICAL EXAMINATION: ECOG PERFORMANCE STATUS: 1 - Symptomatic but completely ambulatory  Vitals:   12/09/23 1414  BP: 124/84  Pulse: 72  Resp: 18  Temp: 97.7 F (36.5 C)  SpO2: 98%   Filed Weights   12/09/23 1414  Weight: 129 lb 9.6 oz (58.8 kg)    Physical Exam BREAST: Right axillary lymph node palpable. Palpable breast mass on the right.  (exam performed in the presence of a chaperone)  LABORATORY DATA:  I have reviewed the data as listed    Latest Ref Rng & Units 12/09/2023    1:57 PM 10/30/2023    2:40 PM 10/28/2023    5:55 PM  CMP  Glucose 70 - 99 mg/dL 898  862    BUN 6 - 20 mg/dL 23  14    Creatinine 9.55 - 1.00 mg/dL 9.05  9.17   8.89   Sodium 135 - 145 mmol/L 137  138    Potassium 3.5 - 5.1 mmol/L 4.9  3.7    Chloride 98 - 111 mmol/L 104  106    CO2 22 - 32 mmol/L 30  28    Calcium  8.9 - 10.3 mg/dL 9.4  8.5    Total Protein 6.5 - 8.1 g/dL 8.0  8.2    Total Bilirubin 0.0 - 1.2 mg/dL 0.2  0.2    Alkaline Phos 38 - 126 U/L 50  47    AST 15 - 41 U/L 10  12    ALT 0 - 44 U/L 7  10      Lab Results  Component Value Date   WBC 6.0 12/09/2023   HGB 10.9 (L) 12/09/2023   HCT 33.5 (L) 12/09/2023   MCV 96.3 12/09/2023   PLT 363 12/09/2023   NEUTROABS 3.2 12/09/2023    ASSESSMENT & PLAN:  Breast cancer metastasized to multiple sites (HCC) 06/16/2020 stage IV: T2 N1 M1 ER/PR negative HER2 positive metastatic breast cancer (diagnosed and treated by Dr. Fernand).  Originally liver and lung metastases 08/09/2020 -11/02/2020 6 cycles of THP, on Herceptin  and Perjeta  maintenance Right breast DCIS Current treatment: Herceptin  Perjeta  maintenance since 12/14/2020 (discontinued November 2023 after she left Dr. Kathern clinic) resumed 04/16/2023   CT CAP 03/18/2023: New dense consolidation right lower lobe, minimal spillover to right middle lobe favoring bacterial pneumonia versus pulmonary hemorrhage, substantial improvement in the right breast lesions and right axillary lymph nodes, left adrenal mass  2 cm: Benign   Hospitalization 03/18/2023-03/25/2023: Pneumococcal bacteremia and sepsis   Treatment plan: Resumed Herceptin  and Perjeta  Echocardiogram completed 01/20/2023: Echo by Dr. Cherrie 01/20/23 EF 60-65%, GLS -16.9%  Echo 03/21/2023: 45 to 50% (Dr. Cherrie reviewed it and felt it was more 50 to 55% and that he was fine for us  to proceed with her treatment)   08/15/2023: CT CAP: Right breast lesions, minimal improvement right axillary lymph nodes, left axillary lymph nodes unchanged, no evidence of progression.  Left adrenal nodule, absent right kidney  10/30/2023: CT CAP: 3 masses right breast increase in size, enlargement  right axillary lymph node  Treatment plan: Change treatment to Enhertu with her next treatment Other option I discussed was mastectomy with targeted axillary lymph node dissection  Patient decided to proceed with Enhertu.   No orders of the defined types were placed in this encounter.  The patient has a good understanding of the overall plan. she agrees with it. she will call with any problems that may develop before the next visit here.  I personally spent a total of 45 minutes in the care of the patient today including preparing to see the patient, getting/reviewing separately obtained history, performing a medically appropriate exam/evaluation, counseling and educating, placing orders, referring and  communicating with other health care professionals, documenting clinical information in the EHR, independently interpreting results, communicating results, and coordinating care.   Viinay K Lejend Dalby, MD 12/09/23

## 2023-12-10 ENCOUNTER — Ambulatory Visit: Payer: Self-pay

## 2023-12-10 ENCOUNTER — Other Ambulatory Visit: Payer: Self-pay | Admitting: Hematology and Oncology

## 2023-12-10 DIAGNOSIS — C50911 Malignant neoplasm of unspecified site of right female breast: Secondary | ICD-10-CM

## 2023-12-10 MED ORDER — PROCHLORPERAZINE MALEATE 10 MG PO TABS: 10.0000 mg | ORAL_TABLET | Freq: Four times a day (QID) | ORAL | 1 refills | Status: DC | PRN

## 2023-12-10 MED ORDER — ONDANSETRON HCL 8 MG PO TABS
8.0000 mg | ORAL_TABLET | Freq: Three times a day (TID) | ORAL | 1 refills | Status: AC | PRN
Start: 2023-12-10 — End: ?

## 2023-12-10 NOTE — Progress Notes (Signed)
 DISCONTINUE OFF PATHWAY REGIMEN - Breast   Custom Intervention:Medical: [Trastuzumab  and hyaluronidase-oysk, Pertuzumab ]:     Trastuzumab  and hyaluronidase-oysk      Pertuzumab    **Always confirm dose/schedule in your pharmacy ordering system**  PRIOR TREATMENT: Off Pathway: Medical: [Trastuzumab  and hyaluronidase-oysk, Pertuzumab ]  START ON PATHWAY REGIMEN - Breast     A cycle is every 21 days:     Fam-trastuzumab  deruxtecan-nxki   **Always confirm dose/schedule in your pharmacy ordering system**  Patient Characteristics: Distant Metastases or Locoregional Recurrent Disease - Unresectable, M0 or Locally Advanced Unresectable Disease Progressing after Neoadjuvant and Local Therapies, M0, HER2 Positive, ER Negative, Chemotherapy, Second Line Therapeutic Status: Distant Metastases HER2 Status: Positive (+) ER Status: Negative (-) PR Status: Negative (-) Line of Therapy: Second Line Intent of Therapy: Non-Curative / Palliative Intent, Discussed with Patient

## 2023-12-15 ENCOUNTER — Encounter: Payer: Self-pay | Admitting: Hematology and Oncology

## 2023-12-15 NOTE — Telephone Encounter (Signed)
-----   Message from Morna JAYSON Kendall sent at 12/15/2023  8:44 AM EDT ----- Has patient been schedule for this.  Will you call her and find out? ----- Message ----- From: SYSTEM Sent: 12/09/2023   1:25 AM EDT To: Morna Dalton Kendall, NP

## 2023-12-23 ENCOUNTER — Ambulatory Visit
Admission: RE | Admit: 2023-12-23 | Discharge: 2023-12-23 | Disposition: A | Discharge status: Home / Self Care | Source: Ambulatory Visit | Attending: Adult Health | Admitting: Adult Health

## 2023-12-23 DIAGNOSIS — C50911 Malignant neoplasm of unspecified site of right female breast: Secondary | ICD-10-CM

## 2023-12-24 ENCOUNTER — Telehealth: Payer: Self-pay | Admitting: Hematology and Oncology

## 2023-12-24 NOTE — Telephone Encounter (Signed)
 ried to contact pt to update about upcoming appts no answer and was not able to leave vm. Sent a My Chart message about appts.

## 2023-12-25 ENCOUNTER — Other Ambulatory Visit: Payer: Self-pay

## 2023-12-30 ENCOUNTER — Inpatient Hospital Stay: Attending: Hematology and Oncology | Admitting: Hematology and Oncology

## 2023-12-30 ENCOUNTER — Inpatient Hospital Stay

## 2023-12-30 DIAGNOSIS — Z5112 Encounter for antineoplastic immunotherapy: Secondary | ICD-10-CM | POA: Insufficient documentation

## 2023-12-30 DIAGNOSIS — Z1731 Human epidermal growth factor receptor 2 positive status: Secondary | ICD-10-CM | POA: Insufficient documentation

## 2023-12-30 DIAGNOSIS — Z1722 Progesterone receptor negative status: Secondary | ICD-10-CM | POA: Insufficient documentation

## 2023-12-30 DIAGNOSIS — C50311 Malignant neoplasm of lower-inner quadrant of right female breast: Secondary | ICD-10-CM | POA: Insufficient documentation

## 2023-12-30 DIAGNOSIS — C787 Secondary malignant neoplasm of liver and intrahepatic bile duct: Secondary | ICD-10-CM | POA: Insufficient documentation

## 2023-12-30 DIAGNOSIS — Z171 Estrogen receptor negative status [ER-]: Secondary | ICD-10-CM | POA: Insufficient documentation

## 2023-12-30 MED FILL — Fosaprepitant Dimeglumine For IV Infusion 150 MG (Base Eq): INTRAVENOUS | Qty: 5 | Status: AC

## 2023-12-30 NOTE — Assessment & Plan Note (Deleted)
 06/16/2020 stage IV: T2 N1 M1 ER/PR negative HER2 positive metastatic breast cancer (diagnosed and treated by Dr. Fernand).  Originally liver and lung metastases 08/09/2020 -11/02/2020 6 cycles of THP, on Herceptin  and Perjeta  maintenance Right breast DCIS Current treatment: Herceptin  Perjeta  maintenance since 12/14/2020 (discontinued November 2023 after she left Dr. Kathern clinic) resumed 04/16/2023   CT CAP 03/18/2023: New dense consolidation right lower lobe, minimal spillover to right middle lobe favoring bacterial pneumonia versus pulmonary hemorrhage, substantial improvement in the right breast lesions and right axillary lymph nodes, left adrenal mass  2 cm: Benign   Hospitalization 03/18/2023-03/25/2023: Pneumococcal bacteremia and sepsis   Treatment plan: Resumed Herceptin  and Perjeta  Echocardiogram completed 01/20/2023: Echo by Dr. Cherrie 01/20/23 EF 60-65%, GLS -16.9%  Echo 03/21/2023: 45 to 50% (Dr. Cherrie reviewed it and felt it was more 50 to 55% and that he was fine for us  to proceed with her treatment)   08/15/2023: CT CAP: Right breast lesions, minimal improvement right axillary lymph nodes, left axillary lymph nodes unchanged, no evidence of progression.  Left adrenal nodule, absent right kidney   10/30/2023: CT CAP: 3 masses right breast increase in size, enlargement right axillary lymph node   Treatment plan: Enhertu started 12/30/23 Other option I discussed was mastectomy with targeted axillary lymph node dissection   Current treatment: Cycle 1 Enhertu

## 2023-12-31 ENCOUNTER — Inpatient Hospital Stay

## 2023-12-31 ENCOUNTER — Encounter: Payer: Self-pay | Admitting: *Deleted

## 2023-12-31 NOTE — Progress Notes (Signed)
 Pt no show for appts today despite scheduling calling and leaving pt a VM with appt details.  Pt has missed several appointments and per MD request, no show letter sent to pt address on file.  Letter also states when pt next appt is and to call our office to re-schedule missed appt.

## 2024-01-02 ENCOUNTER — Other Ambulatory Visit: Payer: Self-pay | Admitting: *Deleted

## 2024-01-02 ENCOUNTER — Inpatient Hospital Stay

## 2024-01-02 ENCOUNTER — Encounter: Payer: Self-pay | Admitting: Hematology and Oncology

## 2024-01-02 VITALS — BP 122/88 | HR 75 | Temp 98.6°F | Resp 18 | Ht 66.0 in | Wt 128.6 lb

## 2024-01-02 DIAGNOSIS — C50911 Malignant neoplasm of unspecified site of right female breast: Secondary | ICD-10-CM

## 2024-01-02 DIAGNOSIS — C50311 Malignant neoplasm of lower-inner quadrant of right female breast: Secondary | ICD-10-CM | POA: Diagnosis present

## 2024-01-02 DIAGNOSIS — Z1731 Human epidermal growth factor receptor 2 positive status: Secondary | ICD-10-CM | POA: Diagnosis not present

## 2024-01-02 DIAGNOSIS — Z171 Estrogen receptor negative status [ER-]: Secondary | ICD-10-CM | POA: Diagnosis not present

## 2024-01-02 DIAGNOSIS — Z1722 Progesterone receptor negative status: Secondary | ICD-10-CM | POA: Diagnosis not present

## 2024-01-02 DIAGNOSIS — C787 Secondary malignant neoplasm of liver and intrahepatic bile duct: Secondary | ICD-10-CM | POA: Diagnosis present

## 2024-01-02 DIAGNOSIS — Z5112 Encounter for antineoplastic immunotherapy: Secondary | ICD-10-CM | POA: Diagnosis present

## 2024-01-02 LAB — CBC WITH DIFFERENTIAL (CANCER CENTER ONLY)
Abs Immature Granulocytes: 0.01 K/uL (ref 0.00–0.07)
Basophils Absolute: 0 K/uL (ref 0.0–0.1)
Basophils Relative: 1 %
Eosinophils Absolute: 0.1 K/uL (ref 0.0–0.5)
Eosinophils Relative: 2 %
HCT: 29.6 % — ABNORMAL LOW (ref 36.0–46.0)
Hemoglobin: 9.8 g/dL — ABNORMAL LOW (ref 12.0–15.0)
Immature Granulocytes: 0 %
Lymphocytes Relative: 33 %
Lymphs Abs: 2.1 K/uL (ref 0.7–4.0)
MCH: 31.1 pg (ref 26.0–34.0)
MCHC: 33.1 g/dL (ref 30.0–36.0)
MCV: 94 fL (ref 80.0–100.0)
Monocytes Absolute: 0.5 K/uL (ref 0.1–1.0)
Monocytes Relative: 7 %
Neutro Abs: 3.8 K/uL (ref 1.7–7.7)
Neutrophils Relative %: 57 %
Platelet Count: 292 K/uL (ref 150–400)
RBC: 3.15 MIL/uL — ABNORMAL LOW (ref 3.87–5.11)
RDW: 15.1 % (ref 11.5–15.5)
WBC Count: 6.5 K/uL (ref 4.0–10.5)
nRBC: 0 % (ref 0.0–0.2)

## 2024-01-02 LAB — CMP (CANCER CENTER ONLY)
ALT: 8 U/L (ref 0–44)
AST: 13 U/L — ABNORMAL LOW (ref 15–41)
Albumin: 3.2 g/dL — ABNORMAL LOW (ref 3.5–5.0)
Alkaline Phosphatase: 45 U/L (ref 38–126)
Anion gap: 3 — ABNORMAL LOW (ref 5–15)
BUN: 13 mg/dL (ref 6–20)
CO2: 29 mmol/L (ref 22–32)
Calcium: 8 mg/dL — ABNORMAL LOW (ref 8.9–10.3)
Chloride: 103 mmol/L (ref 98–111)
Creatinine: 0.91 mg/dL (ref 0.44–1.00)
GFR, Estimated: >60 mL/min (ref >60)
Glucose, Bld: 128 mg/dL — ABNORMAL HIGH (ref 70–99)
Potassium: 3.5 mmol/L (ref 3.5–5.1)
Sodium: 135 mmol/L (ref 135–145)
Total Bilirubin: 0.4 mg/dL (ref 0.0–1.2)
Total Protein: 8 g/dL (ref 6.5–8.1)

## 2024-01-02 LAB — PREGNANCY, URINE: Preg Test, Ur: NEGATIVE

## 2024-01-02 MED ORDER — DEXTROSE 5 % IV SOLN: INTRAVENOUS | Status: DC

## 2024-01-02 MED ORDER — FAM-TRASTUZUMAB DERUXTECAN-NXKI CHEMO 100 MG IV SOLR
4.4000 mg/kg | Freq: Once | INTRAVENOUS | Status: AC
  Administered 2024-01-02: 260 mg via INTRAVENOUS
  Filled 2024-01-02: qty 13

## 2024-01-02 MED ORDER — SODIUM CHLORIDE 0.9 % IV SOLN
150.0000 mg | Freq: Once | INTRAVENOUS | Status: AC
  Administered 2024-01-02: 150 mg via INTRAVENOUS
  Filled 2024-01-02: qty 150

## 2024-01-02 MED ORDER — PALONOSETRON HCL INJECTION 0.25 MG/5ML
0.2500 mg | Freq: Once | INTRAVENOUS | Status: AC
  Administered 2024-01-02: 0.25 mg via INTRAVENOUS
  Filled 2024-01-02: qty 5

## 2024-01-02 MED ORDER — DEXAMETHASONE SOD PHOSPHATE PF 10 MG/ML IJ SOLN
10.0000 mg | Freq: Once | INTRAMUSCULAR | Status: AC
  Administered 2024-01-02: 10 mg via INTRAVENOUS

## 2024-01-02 MED ORDER — ACETAMINOPHEN 325 MG PO TABS
650.0000 mg | ORAL_TABLET | Freq: Once | ORAL | Status: AC
  Administered 2024-01-02: 650 mg via ORAL
  Filled 2024-01-02: qty 2

## 2024-01-02 MED ORDER — DIPHENHYDRAMINE HCL 25 MG PO CAPS
25.0000 mg | ORAL_CAPSULE | Freq: Once | ORAL | Status: AC
  Administered 2024-01-02: 25 mg via ORAL
  Filled 2024-01-02: qty 1

## 2024-01-02 NOTE — Patient Instructions (Signed)
 [facility] - A DEPT OF [facility]  Discharge Instructions: Thank you for choosing [facility] to provide your oncology and hematology care.   If you have a lab appointment with the Cancer Center, please go directly to the Cancer Center and check in at the registration area.   Wear comfortable clothing and clothing appropriate for easy access to any Portacath or PICC line.   We strive to give you quality time with your provider. You may need to reschedule your appointment if you arrive late (15 or more minutes).  Arriving late affects you and other patients whose appointments are after yours.  Also, if you miss three or more appointments without notifying the office, you may be dismissed from the clinic at the provider's discretion.      For prescription refill requests, have your pharmacy contact our office and allow 72 hours for refills to be completed.    Today you received the following chemotherapy and/or immunotherapy agents: Fam-Trastuzumab  deruxtecan (ENHERTU)   To help prevent nausea and vomiting after your treatment, we encourage you to take your nausea medication as directed.  BELOW ARE SYMPTOMS THAT SHOULD BE REPORTED IMMEDIATELY: *FEVER GREATER THAN 100.4 F (38 C) OR HIGHER *CHILLS OR SWEATING *NAUSEA AND VOMITING THAT IS NOT CONTROLLED WITH YOUR NAUSEA MEDICATION *UNUSUAL SHORTNESS OF BREATH *UNUSUAL BRUISING OR BLEEDING *URINARY PROBLEMS (pain or burning when urinating, or frequent urination) *BOWEL PROBLEMS (unusual diarrhea, constipation, pain near the anus) TENDERNESS IN MOUTH AND THROAT WITH OR WITHOUT PRESENCE OF ULCERS (sore throat, sores in mouth, or a toothache) UNUSUAL RASH, SWELLING OR PAIN  UNUSUAL VAGINAL DISCHARGE OR ITCHING   Items with * indicate a potential emergency and should be followed up as soon as possible or go to the Emergency Department if any problems should occur.  Please show the CHEMOTHERAPY  ALERT CARD or IMMUNOTHERAPY ALERT CARD at check-in to the Emergency Department and triage nurse.  Should you have questions after your visit or need to cancel or reschedule your appointment, please contact [facility] - A DEPT OF [facility]  Dept: [phone]  and follow the prompts.  Office hours are 8:00 a.m. to 4:30 p.m. Monday - Friday. Please note that voicemails left after 4:00 p.m. may not be returned until the following business day.  We are closed weekends and major holidays. You have access to a nurse at all times for urgent questions. Please call the main number to the clinic Dept: [phone] and follow the prompts.   For any non-urgent questions, you may also contact your provider using MyChart. We now offer e-Visits for anyone 66 and older to request care online for non-urgent symptoms. For details visit [email or web address].   Also download the MyChart app! Go to the app store, search MyChart, open the app, select [place], and log in with your MyChart username and password.

## 2024-01-02 NOTE — Progress Notes (Signed)
 Pt ok to treat with echo from July per Dr Gudena

## 2024-01-02 NOTE — Progress Notes (Signed)
 Per MD okay to proceed with tx today with echo from 09/05/23. Pt scheduled 11/11 with cardiology for repeat echo.

## 2024-01-09 ENCOUNTER — Ambulatory Visit (HOSPITAL_COMMUNITY)
Admission: RE | Admit: 2024-01-09 | Discharge: 2024-01-09 | Disposition: A | Discharge status: Home / Self Care | Source: Ambulatory Visit | Attending: Internal Medicine | Admitting: Internal Medicine

## 2024-01-09 ENCOUNTER — Other Ambulatory Visit (HOSPITAL_COMMUNITY): Payer: Self-pay

## 2024-01-09 ENCOUNTER — Encounter (HOSPITAL_COMMUNITY): Payer: Self-pay | Admitting: Internal Medicine

## 2024-01-09 VITALS — BP 102/58 | HR 92 | Ht 65.0 in | Wt 133.2 lb

## 2024-01-09 DIAGNOSIS — C50911 Malignant neoplasm of unspecified site of right female breast: Secondary | ICD-10-CM | POA: Insufficient documentation

## 2024-01-09 DIAGNOSIS — I10 Essential (primary) hypertension: Secondary | ICD-10-CM | POA: Diagnosis not present

## 2024-01-09 DIAGNOSIS — C50919 Malignant neoplasm of unspecified site of unspecified female breast: Secondary | ICD-10-CM | POA: Diagnosis not present

## 2024-01-09 DIAGNOSIS — Z1722 Progesterone receptor negative status: Secondary | ICD-10-CM | POA: Insufficient documentation

## 2024-01-09 DIAGNOSIS — Z79899 Other long term (current) drug therapy: Secondary | ICD-10-CM | POA: Insufficient documentation

## 2024-01-09 DIAGNOSIS — Z9221 Personal history of antineoplastic chemotherapy: Secondary | ICD-10-CM | POA: Insufficient documentation

## 2024-01-09 DIAGNOSIS — T451X5A Adverse effect of antineoplastic and immunosuppressive drugs, initial encounter: Secondary | ICD-10-CM | POA: Insufficient documentation

## 2024-01-09 DIAGNOSIS — Z87891 Personal history of nicotine dependence: Secondary | ICD-10-CM | POA: Diagnosis not present

## 2024-01-09 DIAGNOSIS — I427 Cardiomyopathy due to drug and external agent: Secondary | ICD-10-CM | POA: Insufficient documentation

## 2024-01-09 MED ORDER — METOPROLOL SUCCINATE ER 25 MG PO TB24
25.0000 mg | ORAL_TABLET | Freq: Every day | ORAL | 3 refills | Status: DC
  Filled 2024-01-09: qty 90, 90d supply, fill #0

## 2024-01-09 NOTE — Progress Notes (Signed)
 CARDIO-ONCOLOGY CLINIC  NOTE  Referring Physician: Dr. Sharma Bathe Primary Care: none HF Cardiologist: Dr. Cherrie   HPI:  Connie Wells is a 38 y.o. female with h/o tobacco use, congenital solitary kidney and stage IV right breast cancer  ER/PR - Hercpetin +) referred by Dr. Deretha for enrollment into the Cardio-Oncology program.  Diagnosed with R breast cancer in 5/22. Underwent 6 cycles of induction THP in 6/22 and then started on H/P q 3 weeks.   - Echo 6/22 EF 60-65% GLS -17.4% - Echo 1/23 EF 55% GLS -18.2 mild to moderate RVSP - Echo 07/20/21 55-60%  - Echo 11/23/21, EF 55-60%. - Echo 07/03/22 EF 55-60% GLS -17.2%  - TEE 1/25 EF 50% - Echo 7/25 EF 50-55%  Admitted 1/25 with strep pneumoniae PNA/sepsis TEE EF 50%  Here for f/u. We have not seen her in 2 years. Herceptin /Perjeta  changed to Enhertu last week due to 3 new masses in right breast. Feels ok. Has not had losartan  or Toprol  in some time. Feels a bit run down. Poor appetite.    Past Medical History:  Diagnosis Date   Herpes genitalia    Solitary kidney    Current Outpatient Medications  Medication Sig Dispense Refill   ergocalciferol  (VITAMIN D2) 1.25 MG (50000 UT) capsule Take 1 capsule (50,000 Units total) by mouth once a week. (Patient not taking: Reported on 01/09/2024) 12 capsule 1   folic acid  (FOLVITE ) 1 MG tablet Take 1 tablet (1 mg total) by mouth daily. (Patient not taking: Reported on 01/09/2024) 30 tablet 0   losartan  (COZAAR ) 25 MG tablet Take 1 tablet (25 mg total) by mouth daily. (Patient not taking: Reported on 01/09/2024) 90 tablet 3   metoprolol  succinate (TOPROL  XL) 25 MG 24 hr tablet Take 1 tablet (25 mg total) by mouth daily. (Patient not taking: Reported on 01/09/2024) 90 tablet 3   ondansetron  (ZOFRAN ) 8 MG tablet Take 1 tablet (8 mg total) by mouth every 8 (eight) hours as needed for nausea or vomiting. Start on the third day after chemotherapy. 30 tablet 1   prochlorperazine  (COMPAZINE) 10 MG tablet Take 1 tablet (10 mg total) by mouth every 6 (six) hours as needed for nausea or vomiting. (Patient not taking: Reported on 01/09/2024) 30 tablet 1   No current facility-administered medications for this encounter.   No Known Allergies  Social History   Socioeconomic History   Marital status: Single    Spouse name: Not on file   Number of children: Not on file   Years of education: Not on file   Highest education level: Not on file  Occupational History   Not on file  Tobacco Use   Smoking status: Every Day    Current packs/day: 0.50    Average packs/day: 0.5 packs/day for 5.0 years (2.5 ttl pk-yrs)    Types: Cigarettes   Smokeless tobacco: Never  Substance and Sexual Activity   Alcohol use: No    Comment: Occassional   Drug use: Yes    Types: Marijuana   Sexual activity: Yes    Birth control/protection: None  Other Topics Concern   Not on file  Social History Narrative   Not on file   Social Drivers of Health   Financial Resource Strain: Not on file  Food Insecurity: No Food Insecurity (03/20/2023)   Hunger Vital Sign    Worried About Running Out of Food in the Last Year: Never true    Ran  Out of Food in the Last Year: Never true  Recent Concern: Food Insecurity - Food Insecurity Present (01/21/2023)   Hunger Vital Sign    Worried About Running Out of Food in the Last Year: Sometimes true    Ran Out of Food in the Last Year: Sometimes true  Transportation Needs: No Transportation Needs (03/19/2023)   PRAPARE - Administrator, Civil Service (Medical): No    Lack of Transportation (Non-Medical): No  Recent Concern: Transportation Needs - Unmet Transportation Needs (01/21/2023)   PRAPARE - Administrator, Civil Service (Medical): Yes    Lack of Transportation (Non-Medical): No  Physical Activity: Not on file  Stress: Not on file  Social Connections: Unknown (03/19/2023)   Social Connection and Isolation Panel     Frequency of Communication with Friends and Family: More than three times a week    Frequency of Social Gatherings with Friends and Family: More than three times a week    Attends Religious Services: Patient declined    Active Member of Clubs or Organizations: Yes    Attends Banker Meetings: Patient declined    Marital Status: Not on file  Intimate Partner Violence: Not At Risk (03/19/2023)   Humiliation, Afraid, Rape, and Kick questionnaire    Fear of Current or Ex-Partner: No    Emotionally Abused: No    Physically Abused: No    Sexually Abused: No   Family History  Problem Relation Age of Onset   Diabetes Mother    Hypertension Mother    Diabetes Maternal Aunt    Asthma Brother    BP (!) 102/58   Pulse 92   Ht 5' 5 (1.651 m)   Wt 60.4 kg (133 lb 3.2 oz)   SpO2 97%   BMI 22.17 kg/m   Wt Readings from Last 3 Encounters:  01/09/24 60.4 kg (133 lb 3.2 oz)  01/02/24 58.3 kg (128 lb 9.6 oz)  12/09/23 58.8 kg (129 lb 9.6 oz)   PHYSICAL EXAM: General:  Sitting up. No resp difficulty HEENT: normal Neck: supple. no JVD.  Cor: Regular rate & rhythm. No rubs, gallops or murmurs. Lungs: clear Abdomen: soft, nontender, nondistended.Good bowel sounds. Extremities: no cyanosis, clubbing, rash, edema Neuro: alert & orientedx3, cranial nerves grossly intact. moves all 4 extremities w/o difficulty. Affect pleasant   ASSESSMENT & PLAN:  1 Right Breast Cancer, stage IV (widely metastatic)  - Diagnosed 5/22. ER/PR - HER-2 + - S/p induction treatment with Taxotere, Herceptin , and pertuzumab  x 6 cycles starting in 6/22 - Herceptin /Perjeta  changed to Enhertu in 11/25 due to 3 new masses in right breast.  - Continue echo surveillance q 3months  2. Herceptin  induced cardiomyopathy - Echo 6/22 EF 60-65% GLS -17.4% - Echo 1/23 EF 55% GLS -18.2 - Echo 3/23 in clinic EF 50-55%. Suspect early Herceptin  cardiotoxcity - Echo 07/20/21 EF 55-60% (EF normalized) - Echo  11/23/21 EF  60-65%. - Echo 07/03/22 EF 55-60% GLS -17.2%  - Echo 7/25 Echo 50-55% - NYHA I-II volume OK  - BP too low to restart losartan  25 mg daily.  - Restart Toprol  25 - EF stable back on herceptin . Can continue Herceptin . - Repeat echo 2-3 months  3. HTN - BP low   4. Tobacco use - has quit x several weeks   Toribio Fuel, MD  3:43 PM

## 2024-01-09 NOTE — Addendum Note (Signed)
 Encounter addended by: Cadon Raczka B, RN on: 01/09/2024 3:58 PM  Actions taken: Clinical Note Signed, Order list changed, Diagnosis association updated

## 2024-01-09 NOTE — Patient Instructions (Signed)
 Medication Changes:  START METOPROLOL  SUCCINATE 25MG  ONCE DAILY   Follow-Up in: 2 MONTHS WITH AN ECHO PLEASE CALL OUR OFFICE AROUND DECEMBER FOR A JANUARY APPOINTMENT TO GET SCHEDULED FOR YOUR APPOINTMENT. PHONE NUMBER IS [phone] OPTION 2   At the [facility], you and your health needs are our priority. We have a designated team specialized in the treatment of Heart Failure. This Care Team includes your primary Heart Failure Specialized Cardiologist (physician), Advanced Practice Providers (APPs- Physician Assistants and Nurse Practitioners), and Pharmacist who all work together to provide you with the care you need, when you need it.   You may see any of the following providers on your designated Care Team at your next follow up:  Dr. Toribio Fuel Dr. Ezra Shuck Dr. Odis Brownie Greig Mosses, NP Caffie Shed, [place] [facility] [place], [place] Beckey Coe, NP Jordan Lee, NP Tinnie Redman, PharmD   Please be sure to bring in all your medications bottles to every appointment.   Need to Contact Us :  If you have any questions or concerns before your next appointment please send us  a message through [place] or call our office at [phone].    TO LEAVE A MESSAGE FOR THE NURSE SELECT OPTION 2, PLEASE LEAVE A MESSAGE INCLUDING: YOUR NAME DATE OF BIRTH CALL BACK NUMBER REASON FOR CALL**this is important as we prioritize the call backs  YOU WILL RECEIVE A CALL BACK THE SAME DAY AS LONG AS YOU CALL BEFORE 4:00 PM

## 2024-01-19 MED FILL — Fosaprepitant Dimeglumine For IV Infusion 150 MG (Base Eq): INTRAVENOUS | Qty: 5 | Status: AC

## 2024-01-20 ENCOUNTER — Inpatient Hospital Stay

## 2024-01-20 ENCOUNTER — Inpatient Hospital Stay: Admitting: Hematology and Oncology

## 2024-01-20 NOTE — Assessment & Plan Note (Deleted)
 06/16/2020 stage IV: T2 N1 M1 ER/PR negative HER2 positive metastatic breast cancer (diagnosed and treated by Dr. Fernand).  Originally liver and lung metastases 08/09/2020 -11/02/2020 6 cycles of THP, on Herceptin  and Perjeta  maintenance Right breast DCIS Current treatment: Herceptin  Perjeta  maintenance since 12/14/2020 (discontinued November 2023 after she left Dr. Kathern clinic) resumed 04/16/2023   CT CAP 03/18/2023: New dense consolidation right lower lobe, minimal spillover to right middle lobe favoring bacterial pneumonia versus pulmonary hemorrhage, substantial improvement in the right breast lesions and right axillary lymph nodes, left adrenal mass  2 cm: Benign   Hospitalization 03/18/2023-03/25/2023: Pneumococcal bacteremia and sepsis   Treatment plan: Resumed Herceptin  and Perjeta  Echocardiogram completed 01/20/2023: Echo by Dr. Cherrie 01/20/23 EF 60-65%, GLS -16.9%  Echo 03/21/2023: 45 to 50% (Dr. Cherrie reviewed it and felt it was more 50 to 55% and that he was fine for us  to proceed with her treatment)   08/15/2023: CT CAP: Right breast lesions, minimal improvement right axillary lymph nodes, left axillary lymph nodes unchanged, no evidence of progression.  Left adrenal nodule, absent right kidney   10/30/2023: CT CAP: 3 masses right breast increase in size, enlargement right axillary lymph node   Current treatment: Enhertu  started 01/02/2024, today cycle 2 Chemo toxicities:   Return to clinic in 3 weeks for cycle 3

## 2024-01-21 ENCOUNTER — Telehealth: Payer: Self-pay | Admitting: Hematology and Oncology

## 2024-01-21 NOTE — Telephone Encounter (Signed)
 I spoke with patient on 01/21/2024 and she confirmed appointments on 01/26/2024 for lab. MD, and infusion. I mailed her reminders and advised her of appointments on MyChart.

## 2024-01-22 ENCOUNTER — Other Ambulatory Visit: Payer: Self-pay

## 2024-01-26 ENCOUNTER — Inpatient Hospital Stay

## 2024-01-26 ENCOUNTER — Inpatient Hospital Stay: Attending: Hematology and Oncology | Admitting: Hematology and Oncology

## 2024-01-26 ENCOUNTER — Inpatient Hospital Stay: Attending: Hematology and Oncology

## 2024-01-26 ENCOUNTER — Encounter: Payer: Self-pay | Admitting: Hematology and Oncology

## 2024-01-26 VITALS — BP 120/84 | HR 68 | Temp 98.0°F | Resp 18 | Ht 65.0 in | Wt 132.0 lb

## 2024-01-26 VITALS — BP 119/84 | HR 70 | Temp 98.1°F | Resp 16

## 2024-01-26 DIAGNOSIS — C7800 Secondary malignant neoplasm of unspecified lung: Secondary | ICD-10-CM | POA: Diagnosis not present

## 2024-01-26 DIAGNOSIS — C50911 Malignant neoplasm of unspecified site of right female breast: Secondary | ICD-10-CM

## 2024-01-26 DIAGNOSIS — Z1731 Human epidermal growth factor receptor 2 positive status: Secondary | ICD-10-CM | POA: Insufficient documentation

## 2024-01-26 DIAGNOSIS — Z79899 Other long term (current) drug therapy: Secondary | ICD-10-CM | POA: Diagnosis not present

## 2024-01-26 DIAGNOSIS — Z171 Estrogen receptor negative status [ER-]: Secondary | ICD-10-CM | POA: Diagnosis not present

## 2024-01-26 DIAGNOSIS — C50311 Malignant neoplasm of lower-inner quadrant of right female breast: Secondary | ICD-10-CM | POA: Insufficient documentation

## 2024-01-26 DIAGNOSIS — C7951 Secondary malignant neoplasm of bone: Secondary | ICD-10-CM | POA: Diagnosis not present

## 2024-01-26 DIAGNOSIS — Z5112 Encounter for antineoplastic immunotherapy: Secondary | ICD-10-CM | POA: Diagnosis present

## 2024-01-26 DIAGNOSIS — Z1722 Progesterone receptor negative status: Secondary | ICD-10-CM | POA: Insufficient documentation

## 2024-01-26 DIAGNOSIS — C787 Secondary malignant neoplasm of liver and intrahepatic bile duct: Secondary | ICD-10-CM | POA: Insufficient documentation

## 2024-01-26 LAB — CBC WITH DIFFERENTIAL (CANCER CENTER ONLY)
Abs Immature Granulocytes: 0.04 K/uL (ref 0.00–0.07)
Basophils Absolute: 0.1 K/uL (ref 0.0–0.1)
Basophils Relative: 1 %
Eosinophils Absolute: 0.1 K/uL (ref 0.0–0.5)
Eosinophils Relative: 1 %
HCT: 34.7 % — ABNORMAL LOW (ref 36.0–46.0)
Hemoglobin: 11.7 g/dL — ABNORMAL LOW (ref 12.0–15.0)
Immature Granulocytes: 0 %
Lymphocytes Relative: 30 %
Lymphs Abs: 2.8 K/uL (ref 0.7–4.0)
MCH: 31.6 pg (ref 26.0–34.0)
MCHC: 33.7 g/dL (ref 30.0–36.0)
MCV: 93.8 fL (ref 80.0–100.0)
Monocytes Absolute: 0.8 K/uL (ref 0.1–1.0)
Monocytes Relative: 8 %
Neutro Abs: 5.6 K/uL (ref 1.7–7.7)
Neutrophils Relative %: 60 %
Platelet Count: 470 K/uL — ABNORMAL HIGH (ref 150–400)
RBC: 3.7 MIL/uL — ABNORMAL LOW (ref 3.87–5.11)
RDW: 15 % (ref 11.5–15.5)
WBC Count: 9.4 K/uL (ref 4.0–10.5)
nRBC: 0 % (ref 0.0–0.2)

## 2024-01-26 LAB — CMP (CANCER CENTER ONLY)
ALT: 7 U/L (ref 0–44)
AST: 14 U/L — ABNORMAL LOW (ref 15–41)
Albumin: 3.6 g/dL (ref 3.5–5.0)
Alkaline Phosphatase: 51 U/L (ref 38–126)
Anion gap: 6 (ref 5–15)
BUN: 12 mg/dL (ref 6–20)
CO2: 28 mmol/L (ref 22–32)
Calcium: 9 mg/dL (ref 8.9–10.3)
Chloride: 103 mmol/L (ref 98–111)
Creatinine: 0.76 mg/dL (ref 0.44–1.00)
GFR, Estimated: >60 mL/min (ref >60)
Glucose, Bld: 89 mg/dL (ref 70–99)
Potassium: 4 mmol/L (ref 3.5–5.1)
Sodium: 137 mmol/L (ref 135–145)
Total Bilirubin: <0.2 mg/dL (ref 0.0–1.2)
Total Protein: 8 g/dL (ref 6.5–8.1)

## 2024-01-26 LAB — PREGNANCY, URINE: Preg Test, Ur: NEGATIVE

## 2024-01-26 MED ORDER — DEXTROSE 5 % IV SOLN: INTRAVENOUS | Status: DC

## 2024-01-26 MED ORDER — SODIUM CHLORIDE 0.9% FLUSH: 10.0000 mL | INTRAVENOUS | Status: DC | PRN

## 2024-01-26 MED ORDER — DIPHENHYDRAMINE HCL 25 MG PO CAPS
25.0000 mg | ORAL_CAPSULE | Freq: Once | ORAL | Status: AC
  Administered 2024-01-26: 25 mg via ORAL
  Filled 2024-01-26: qty 1

## 2024-01-26 MED ORDER — FAM-TRASTUZUMAB DERUXTECAN-NXKI CHEMO 100 MG IV SOLR
4.4000 mg/kg | Freq: Once | INTRAVENOUS | Status: AC
  Administered 2024-01-26: 260 mg via INTRAVENOUS
  Filled 2024-01-26: qty 13

## 2024-01-26 MED ORDER — ACETAMINOPHEN 325 MG PO TABS
650.0000 mg | ORAL_TABLET | Freq: Once | ORAL | Status: AC
  Administered 2024-01-26: 650 mg via ORAL
  Filled 2024-01-26: qty 2

## 2024-01-26 MED ORDER — SODIUM CHLORIDE 0.9 % IV SOLN
150.0000 mg | Freq: Once | INTRAVENOUS | Status: AC
  Administered 2024-01-26: 150 mg via INTRAVENOUS
  Filled 2024-01-26: qty 150

## 2024-01-26 MED ORDER — DEXAMETHASONE SOD PHOSPHATE PF 10 MG/ML IJ SOLN
10.0000 mg | Freq: Once | INTRAMUSCULAR | Status: AC
  Administered 2024-01-26: 10 mg via INTRAVENOUS

## 2024-01-26 MED ORDER — PALONOSETRON HCL INJECTION 0.25 MG/5ML
0.2500 mg | Freq: Once | INTRAVENOUS | Status: AC
  Administered 2024-01-26: 0.25 mg via INTRAVENOUS
  Filled 2024-01-26: qty 5

## 2024-01-26 NOTE — Progress Notes (Signed)
 Patient had concerns about her port feeling like it was pulling sometimes when she yawned- discussed with Dr. Gudena. Per MD- it may be because she is thin.

## 2024-01-26 NOTE — Assessment & Plan Note (Signed)
 06/16/2020 stage IV: T2 N1 M1 ER/PR negative HER2 positive metastatic breast cancer (diagnosed and treated by Dr. Fernand).  Originally liver and lung metastases 08/09/2020 -11/02/2020 6 cycles of THP, on Herceptin  and Perjeta  maintenance Right breast DCIS Current treatment: Herceptin  Perjeta  maintenance since 12/14/2020 (discontinued November 2023 after she left Dr. Kathern clinic) resumed 04/16/2023   CT CAP 03/18/2023: New dense consolidation right lower lobe, minimal spillover to right middle lobe favoring bacterial pneumonia versus pulmonary hemorrhage, substantial improvement in the right breast lesions and right axillary lymph nodes, left adrenal mass  2 cm: Benign   Hospitalization 03/18/2023-03/25/2023: Pneumococcal bacteremia and sepsis   Treatment plan: Resumed Herceptin  and Perjeta  Echocardiogram completed 01/20/2023: Echo by Dr. Cherrie 01/20/23 EF 60-65%, GLS -16.9%  Echo 03/21/2023: 45 to 50% (Dr. Cherrie reviewed it and felt it was more 50 to 55% and that he was fine for us  to proceed with her treatment)   08/15/2023: CT CAP: Right breast lesions, minimal improvement right axillary lymph nodes, left axillary lymph nodes unchanged, no evidence of progression.  Left adrenal nodule, absent right kidney   10/30/2023: CT CAP: 3 masses right breast increase in size, enlargement right axillary lymph node   Current treatment: Enhertu  started 01/02/2024, today cycle 2 Chemo toxicities:   Return to clinic in 3 weeks for cycle 3

## 2024-01-26 NOTE — Progress Notes (Signed)
 Patient Care Team: Patient, No Pcp Per as PCP - General (General Practice)  DIAGNOSIS:  Encounter Diagnosis  Name Primary?   Carcinoma of right breast metastatic to multiple sites [facility]) Yes    SUMMARY OF ONCOLOGIC HISTORY: Oncology History  Breast cancer metastasized to multiple sites (HCC)  06/16/2020 Initial Diagnosis   [place]:  stage IV: T2 N1 M1 ER/PR negative HER2 positive metastatic breast cancer (diagnosed and treated by Dr. Fernand) 08/09/2020 -11/02/2020 6 cycles of THP, on Herceptin  and Perjeta  maintenance Right breast DCIS   01/21/2023 - 12/09/2023 Chemotherapy   Patient is on Treatment Plan : BREAST Trastuzumab  + Pertuzumab  q21d     01/02/2024 -  Chemotherapy   Patient is on Treatment Plan : BREAST Fam-Trastuzumab Deruxtecan-nxki  (Enhertu ) (5.4) q21d       CHIEF COMPLIANT: Cycle 2 Enhertu   HISTORY OF PRESENT ILLNESS: History of Present Illness Connie Wells is a 38 year old female who presents for follow-up of her treatment regimen.  She is here to receive cycle 2 Enhertu .  She experienced significant side effects from her recent treatment, including a week of feeling very unwell with altered taste and morning-sickness-like nausea that limited her ability to eat. She did not use her prescribed nausea medication at first because she was unclear which pharmacy had filled it and has not yet located it.  After that week her appetite returned and she currently feels well. She is now having frequent mild headaches in the temple area, which she describes as feeling dented. She previously had CT scans and is worried something may be wrong with her brain.  She also had a cough that raised concern for pneumonia but this has resolved. She denies fever.     ALLERGIES:  has no known allergies.  MEDICATIONS:  Current Outpatient Medications  Medication Sig Dispense Refill   ergocalciferol  (VITAMIN D2) 1.25 MG (50000 UT) capsule Take 1 capsule (50,000 Units total) by  mouth once a week. (Patient not taking: Reported on 01/09/2024) 12 capsule 1   folic acid  (FOLVITE ) 1 MG tablet Take 1 tablet (1 mg total) by mouth daily. (Patient not taking: Reported on 01/09/2024) 30 tablet 0   losartan  (COZAAR ) 25 MG tablet Take 1 tablet (25 mg total) by mouth daily. (Patient not taking: Reported on 01/09/2024) 90 tablet 3   metoprolol  succinate (TOPROL  XL) 25 MG 24 hr tablet Take 1 tablet (25 mg total) by mouth daily. 90 tablet 3   ondansetron  (ZOFRAN ) 8 MG tablet Take 1 tablet (8 mg total) by mouth every 8 (eight) hours as needed for nausea or vomiting. Start on the third day after chemotherapy. 30 tablet 1   prochlorperazine  (COMPAZINE ) 10 MG tablet Take 1 tablet (10 mg total) by mouth every 6 (six) hours as needed for nausea or vomiting. (Patient not taking: Reported on 01/09/2024) 30 tablet 1   No current facility-administered medications for this visit.   Facility-Administered Medications Ordered in Other Visits  Medication Dose Route Frequency Provider Last Rate Last Admin   dextrose  5 % solution   Intravenous Continuous Jimmy Stipes, MD 10 mL/hr at 01/26/24 1547 New Bag at 01/26/24 1547   fam-trastuzumab deruxtecan-nxki  (ENHERTU ) 260 mg in dextrose  5 % 100 mL chemo infusion  4.4 mg/kg (Treatment Plan Recorded) Intravenous Once Arlind Klingerman, MD       fosaprepitant  (EMEND) 150 mg in sodium chloride  0.9 % 145 mL IVPB  150 mg Intravenous Once Etienne Mowers, MD       sodium chloride   flush (NS) 0.9 % injection 10 mL  10 mL Intracatheter PRN Odean Potts, MD        PHYSICAL EXAMINATION: ECOG PERFORMANCE STATUS: 1 - Symptomatic but completely ambulatory  Vitals:   01/26/24 1507  BP: 120/84  Pulse: 68  Resp: 18  Temp: 98 F (36.7 C)  SpO2: 100%   Filed Weights   01/26/24 1507  Weight: 132 lb (59.9 kg)    LABORATORY DATA:  I have reviewed the data as listed    Latest Ref Rng & Units 01/26/2024    2:37 PM 01/02/2024    1:17 PM 12/09/2023    1:57 PM  CMP   Glucose 70 - 99 mg/dL 89  871  898   BUN 6 - 20 mg/dL 12  13  23    Creatinine 0.44 - 1.00 mg/dL 9.23  9.08  9.05   Sodium 135 - 145 mmol/L 137  135  137   Potassium 3.5 - 5.1 mmol/L 4.0  3.5  4.9   Chloride 98 - 111 mmol/L 103  103  104   CO2 22 - 32 mmol/L 28  29  30    Calcium  8.9 - 10.3 mg/dL 9.0  8.0  9.4   Total Protein 6.5 - 8.1 g/dL 8.0  8.0  8.0   Total Bilirubin 0.0 - 1.2 mg/dL <9.7  0.4  0.2   Alkaline Phos 38 - 126 U/L 51  45  50   AST 15 - 41 U/L 14  13  10    ALT 0 - 44 U/L 7  8  7      Lab Results  Component Value Date   WBC 9.4 01/26/2024   HGB 11.7 (L) 01/26/2024   HCT 34.7 (L) 01/26/2024   MCV 93.8 01/26/2024   PLT 470 (H) 01/26/2024   NEUTROABS 5.6 01/26/2024    ASSESSMENT & PLAN:  Breast cancer metastasized to multiple sites (HCC) 06/16/2020 stage IV: T2 N1 M1 ER/PR negative HER2 positive metastatic breast cancer (diagnosed and treated by Dr. Fernand).  Originally liver and lung metastases 08/09/2020 -11/02/2020 6 cycles of THP, on Herceptin  and Perjeta  maintenance Right breast DCIS Current treatment: Herceptin  Perjeta  maintenance since 12/14/2020 (discontinued November 2023 after she left Dr. Kathern clinic) resumed 04/16/2023   CT CAP 03/18/2023: New dense consolidation right lower lobe, minimal spillover to right middle lobe favoring bacterial pneumonia versus pulmonary hemorrhage, substantial improvement in the right breast lesions and right axillary lymph nodes, left adrenal mass  2 cm: Benign   Hospitalization 03/18/2023-03/25/2023: Pneumococcal bacteremia and sepsis   Treatment plan: Resumed Herceptin  and Perjeta  Echocardiogram completed 01/20/2023: Echo by Dr. Cherrie 01/20/23 EF 60-65%, GLS -16.9%  Echo 03/21/2023: 45 to 50% (Dr. Cherrie reviewed it and felt it was more 50 to 55% and that he was fine for us  to proceed with her treatment)   08/15/2023: CT CAP: Right breast lesions, minimal improvement right axillary lymph nodes, left axillary lymph nodes  unchanged, no evidence of progression.  Left adrenal nodule, absent right kidney   10/30/2023: CT CAP: 3 masses right breast increase in size, enlargement right axillary lymph node   Current treatment: Enhertu  started 01/02/2024, today cycle 2 Chemo toxicities: Nausea that lasted a whole week Fatigue   Return to clinic in 3 weeks for cycle 3 ------------------------------------- Assessment and Plan Assessment & Plan Headache, evaluation for possible brain metastasis Frequent mild headaches with concern for brain metastasis. No signs of infection. - Ordered MRI of the brain to evaluate for possible metastasis. -  Scheduled MRI as a stat order for tonight or tomorrow.   Orders Placed This Encounter  Procedures   MR Brain W Wo Contrast    Standing Status:   Future    Expiration Date:   01/25/2025    If indicated for the ordered procedure, I authorize the administration of contrast media per Radiology protocol:   Yes    What is the patient's sedation requirement?:   No Sedation    Does the patient have a pacemaker or implanted devices?:   No    Use SRS Protocol?:   No    Preferred imaging location?:   [facility] (table limit - 500lbs)   The patient has a good understanding of the overall plan. she agrees with it. she will call with any problems that may develop before the next visit here.  I personally spent a total of 30 minutes in the care of the patient today including preparing to see the patient, getting/reviewing separately obtained history, performing a medically appropriate exam/evaluation, counseling and educating, placing orders, referring and communicating with other health care professionals, documenting clinical information in the EHR, independently interpreting results, communicating results, and coordinating care.   Viinay K Ethelbert Thain, MD 01/26/24

## 2024-01-26 NOTE — Patient Instructions (Signed)
 [facility] - A DEPT OF [facility]  Discharge Instructions: Thank you for choosing [facility] to provide your oncology and hematology care.   If you have a lab appointment with the Cancer Center, please go directly to the Cancer Center and check in at the registration area.   Wear comfortable clothing and clothing appropriate for easy access to any Portacath or PICC line.   We strive to give you quality time with your provider. You may need to reschedule your appointment if you arrive late (15 or more minutes).  Arriving late affects you and other patients whose appointments are after yours.  Also, if you miss three or more appointments without notifying the office, you may be dismissed from the clinic at the provider's discretion.      For prescription refill requests, have your pharmacy contact our office and allow 72 hours for refills to be completed.    Today you received the following chemotherapy and/or immunotherapy agents: Fam-Trastuzumab  deruxtecan (ENHERTU)   To help prevent nausea and vomiting after your treatment, we encourage you to take your nausea medication as directed.  BELOW ARE SYMPTOMS THAT SHOULD BE REPORTED IMMEDIATELY: *FEVER GREATER THAN 100.4 F (38 C) OR HIGHER *CHILLS OR SWEATING *NAUSEA AND VOMITING THAT IS NOT CONTROLLED WITH YOUR NAUSEA MEDICATION *UNUSUAL SHORTNESS OF BREATH *UNUSUAL BRUISING OR BLEEDING *URINARY PROBLEMS (pain or burning when urinating, or frequent urination) *BOWEL PROBLEMS (unusual diarrhea, constipation, pain near the anus) TENDERNESS IN MOUTH AND THROAT WITH OR WITHOUT PRESENCE OF ULCERS (sore throat, sores in mouth, or a toothache) UNUSUAL RASH, SWELLING OR PAIN  UNUSUAL VAGINAL DISCHARGE OR ITCHING   Items with * indicate a potential emergency and should be followed up as soon as possible or go to the Emergency Department if any problems should occur.  Please show the CHEMOTHERAPY  ALERT CARD or IMMUNOTHERAPY ALERT CARD at check-in to the Emergency Department and triage nurse.  Should you have questions after your visit or need to cancel or reschedule your appointment, please contact [facility] - A DEPT OF [facility]  Dept: [phone]  and follow the prompts.  Office hours are 8:00 a.m. to 4:30 p.m. Monday - Friday. Please note that voicemails left after 4:00 p.m. may not be returned until the following business day.  We are closed weekends and major holidays. You have access to a nurse at all times for urgent questions. Please call the main number to the clinic Dept: [phone] and follow the prompts.   For any non-urgent questions, you may also contact your provider using MyChart. We now offer e-Visits for anyone 66 and older to request care online for non-urgent symptoms. For details visit [email or web address].   Also download the MyChart app! Go to the app store, search MyChart, open the app, select [place], and log in with your MyChart username and password.

## 2024-01-27 ENCOUNTER — Telehealth: Payer: Self-pay | Admitting: Hematology and Oncology

## 2024-01-27 NOTE — Telephone Encounter (Signed)
 called pt to update about f/u appt scheduled- no answer and could not leave vm.. My Chart message sent with date and time of appointment. Encouraged to call office to confirm.

## 2024-01-28 ENCOUNTER — Encounter: Payer: Self-pay | Admitting: Neurology

## 2024-01-28 ENCOUNTER — Other Ambulatory Visit: Payer: Self-pay

## 2024-01-28 ENCOUNTER — Ambulatory Visit (HOSPITAL_COMMUNITY): Admission: RE | Admit: 2024-01-28 | Discharge: 2024-01-28 | Attending: Hematology and Oncology

## 2024-01-28 DIAGNOSIS — R519 Headache, unspecified: Secondary | ICD-10-CM

## 2024-01-28 DIAGNOSIS — C50911 Malignant neoplasm of unspecified site of right female breast: Secondary | ICD-10-CM | POA: Diagnosis present

## 2024-01-28 MED ORDER — GADOBUTROL 1 MMOL/ML IV SOLN
6.0000 mL | Freq: Once | INTRAVENOUS | Status: AC | PRN
  Administered 2024-01-28: 6 mL via INTRAVENOUS

## 2024-01-28 NOTE — Progress Notes (Signed)
 Reviewed recent brain MRI w/ MD. MD states no signs of metastatic disease. Verbal orders received and placed for pt to be referred to neurology for headaches. Pt notified and verbalized understanding.

## 2024-01-28 NOTE — Assessment & Plan Note (Deleted)
 06/16/2020 stage IV: T2 N1 M1 ER/PR negative HER2 positive metastatic breast cancer (diagnosed and treated by Dr. Fernand).  Originally liver and lung metastases 08/09/2020 -11/02/2020 6 cycles of THP, on Herceptin  and Perjeta  maintenance Right breast DCIS Current treatment: Herceptin  Perjeta  maintenance since 12/14/2020 (discontinued November 2023 after she left Dr. Kathern clinic) resumed 04/16/2023   CT CAP 03/18/2023: New dense consolidation right lower lobe, minimal spillover to right middle lobe favoring bacterial pneumonia versus pulmonary hemorrhage, substantial improvement in the right breast lesions and right axillary lymph nodes, left adrenal mass  2 cm: Benign   Hospitalization 03/18/2023-03/25/2023: Pneumococcal bacteremia and sepsis   Treatment plan: Resumed Herceptin  and Perjeta  Echocardiogram completed 01/20/2023: Echo by Dr. Cherrie 01/20/23 EF 60-65%, GLS -16.9%  Echo 03/21/2023: 45 to 50% (Dr. Cherrie reviewed it and felt it was more 50 to 55% and that he was fine for us  to proceed with her treatment)   08/15/2023: CT CAP: Right breast lesions, minimal improvement right axillary lymph nodes, left axillary lymph nodes unchanged, no evidence of progression.  Left adrenal nodule, absent right kidney   10/30/2023: CT CAP: 3 masses right breast increase in size, enlargement right axillary lymph node   Current treatment: Enhertu  started 01/02/2024, today is cycle 3 Chemo toxicities:  Nausea that lasted a whole week Fatigue  Return to clinic in 3 weeks for cycle 4

## 2024-01-30 ENCOUNTER — Other Ambulatory Visit: Payer: Self-pay

## 2024-02-02 ENCOUNTER — Inpatient Hospital Stay: Admitting: Hematology and Oncology

## 2024-02-02 DIAGNOSIS — C50911 Malignant neoplasm of unspecified site of right female breast: Secondary | ICD-10-CM

## 2024-02-06 ENCOUNTER — Telehealth: Payer: Self-pay | Admitting: Licensed Clinical Social Worker

## 2024-02-06 NOTE — Telephone Encounter (Signed)
 [facility] Clinical Social Work  CSW attempted to reach pt by phone to check on resource needs. No answer, unable to leave VM.   Aldora Perman E Raul Torrance, LCSW

## 2024-02-13 MED FILL — Fosaprepitant Dimeglumine For IV Infusion 150 MG (Base Eq): INTRAVENOUS | Qty: 5 | Status: AC

## 2024-02-16 ENCOUNTER — Inpatient Hospital Stay: Admitting: Hematology and Oncology

## 2024-02-16 ENCOUNTER — Inpatient Hospital Stay

## 2024-02-16 VITALS — BP 122/81 | HR 65 | Temp 97.9°F | Resp 18 | Ht 65.0 in | Wt 130.0 lb

## 2024-02-16 DIAGNOSIS — C50911 Malignant neoplasm of unspecified site of right female breast: Secondary | ICD-10-CM

## 2024-02-16 DIAGNOSIS — Z5112 Encounter for antineoplastic immunotherapy: Secondary | ICD-10-CM | POA: Diagnosis not present

## 2024-02-16 LAB — CMP (CANCER CENTER ONLY)
ALT: 9 U/L (ref 0–44)
AST: 15 U/L (ref 15–41)
Albumin: 3.6 g/dL (ref 3.5–5.0)
Alkaline Phosphatase: 53 U/L (ref 38–126)
Anion gap: 7 (ref 5–15)
BUN: <5 mg/dL — ABNORMAL LOW (ref 6–20)
CO2: 26 mmol/L (ref 22–32)
Calcium: 8.6 mg/dL — ABNORMAL LOW (ref 8.9–10.3)
Chloride: 105 mmol/L (ref 98–111)
Creatinine: 0.82 mg/dL (ref 0.44–1.00)
GFR, Estimated: >60 mL/min
Glucose, Bld: 121 mg/dL — ABNORMAL HIGH (ref 70–99)
Potassium: 3.6 mmol/L (ref 3.5–5.1)
Sodium: 138 mmol/L (ref 135–145)
Total Bilirubin: 0.3 mg/dL (ref 0.0–1.2)
Total Protein: 7.5 g/dL (ref 6.5–8.1)

## 2024-02-16 LAB — CBC WITH DIFFERENTIAL (CANCER CENTER ONLY)
Abs Immature Granulocytes: 0.01 K/uL (ref 0.00–0.07)
Basophils Absolute: 0.1 K/uL (ref 0.0–0.1)
Basophils Relative: 1 %
Eosinophils Absolute: 0.2 K/uL (ref 0.0–0.5)
Eosinophils Relative: 3 %
HCT: 32.2 % — ABNORMAL LOW (ref 36.0–46.0)
Hemoglobin: 10.8 g/dL — ABNORMAL LOW (ref 12.0–15.0)
Immature Granulocytes: 0 %
Lymphocytes Relative: 35 %
Lymphs Abs: 2.2 K/uL (ref 0.7–4.0)
MCH: 31.9 pg (ref 26.0–34.0)
MCHC: 33.5 g/dL (ref 30.0–36.0)
MCV: 95 fL (ref 80.0–100.0)
Monocytes Absolute: 0.5 K/uL (ref 0.1–1.0)
Monocytes Relative: 8 %
Neutro Abs: 3.5 K/uL (ref 1.7–7.7)
Neutrophils Relative %: 53 %
Platelet Count: 379 K/uL (ref 150–400)
RBC: 3.39 MIL/uL — ABNORMAL LOW (ref 3.87–5.11)
RDW: 15.1 % (ref 11.5–15.5)
WBC Count: 6.4 K/uL (ref 4.0–10.5)
nRBC: 0 % (ref 0.0–0.2)

## 2024-02-16 LAB — PREGNANCY, URINE: Preg Test, Ur: NEGATIVE

## 2024-02-16 MED ORDER — SODIUM CHLORIDE 0.9 % IV SOLN
150.0000 mg | Freq: Once | INTRAVENOUS | Status: AC
  Administered 2024-02-16: 150 mg via INTRAVENOUS
  Filled 2024-02-16: qty 150

## 2024-02-16 MED ORDER — PALONOSETRON HCL INJECTION 0.25 MG/5ML
0.2500 mg | Freq: Once | INTRAVENOUS | Status: AC
  Administered 2024-02-16: 0.25 mg via INTRAVENOUS
  Filled 2024-02-16: qty 5

## 2024-02-16 MED ORDER — DEXTROSE 5 % IV SOLN: INTRAVENOUS | Status: DC

## 2024-02-16 MED ORDER — ACETAMINOPHEN 325 MG PO TABS
650.0000 mg | ORAL_TABLET | Freq: Once | ORAL | Status: AC
  Administered 2024-02-16: 650 mg via ORAL
  Filled 2024-02-16: qty 2

## 2024-02-16 MED ORDER — DIPHENHYDRAMINE HCL 25 MG PO CAPS
25.0000 mg | ORAL_CAPSULE | Freq: Once | ORAL | Status: AC
  Administered 2024-02-16: 25 mg via ORAL
  Filled 2024-02-16: qty 1

## 2024-02-16 MED ORDER — DEXAMETHASONE SOD PHOSPHATE PF 10 MG/ML IJ SOLN
10.0000 mg | Freq: Once | INTRAMUSCULAR | Status: AC
  Administered 2024-02-16: 10 mg via INTRAVENOUS

## 2024-02-16 MED ORDER — FAM-TRASTUZUMAB DERUXTECAN-NXKI CHEMO 100 MG IV SOLR
4.4000 mg/kg | Freq: Once | INTRAVENOUS | Status: AC
  Administered 2024-02-16: 260 mg via INTRAVENOUS
  Filled 2024-02-16: qty 13

## 2024-02-16 NOTE — Progress Notes (Signed)
 "  Patient Care Team: Patient, No Pcp Per as PCP - General (General Practice)  DIAGNOSIS:  Encounter Diagnosis  Name Primary?   Carcinoma of right breast metastatic to multiple sites [facility]) Yes    SUMMARY OF ONCOLOGIC HISTORY: Oncology History  Breast cancer metastasized to multiple sites (HCC)  06/16/2020 Initial Diagnosis   [place]:  stage IV: T2 N1 M1 ER/PR negative HER2 positive metastatic breast cancer (diagnosed and treated by Dr. Fernand) 08/09/2020 -11/02/2020 6 cycles of THP, on Herceptin  and Perjeta  maintenance Right breast DCIS   01/21/2023 - 12/09/2023 Chemotherapy   Patient is on Treatment Plan : BREAST Trastuzumab  + Pertuzumab  q21d     01/02/2024 -  Chemotherapy   Patient is on Treatment Plan : BREAST Fam-Trastuzumab Deruxtecan-nxki  (Enhertu ) (5.4) q21d       CHIEF COMPLIANT: Cycle 3 Enhertu   HISTORY OF PRESENT ILLNESS:  History of Present Illness Connie Wells is a 38 year old female with metastatic HER2-positive breast carcinoma presenting for follow-up during her third cycle of second-line therapy and evaluation of headache.  She is receiving cycle 3 of Fam-Trastuzumab Deruxtecan-nxki  (Enhertu ) for metastatic disease to the liver, lungs, and right axillary lymph nodes.  She has had nausea for the past week, similar to prior cycles, but otherwise feels well and has tolerated blood work.  She continues to have headaches, which she attributes to a known vascular abnormality on prior brain evaluation, describing the sensation as thin and pumping slow. Brain imaging is scheduled for January 12th per prior recommendations.  She has no respiratory or other new acute symptoms despite recent exposure to her daughter with a respiratory virus.      ALLERGIES:  has no known allergies.  MEDICATIONS:  Current Outpatient Medications  Medication Sig Dispense Refill   ergocalciferol  (VITAMIN D2) 1.25 MG (50000 UT) capsule Take 1 capsule (50,000 Units total) by  mouth once a week. (Patient not taking: Reported on 01/09/2024) 12 capsule 1   folic acid  (FOLVITE ) 1 MG tablet Take 1 tablet (1 mg total) by mouth daily. (Patient not taking: Reported on 01/09/2024) 30 tablet 0   losartan  (COZAAR ) 25 MG tablet Take 1 tablet (25 mg total) by mouth daily. (Patient not taking: Reported on 01/09/2024) 90 tablet 3   metoprolol  succinate (TOPROL  XL) 25 MG 24 hr tablet Take 1 tablet (25 mg total) by mouth daily. 90 tablet 3   ondansetron  (ZOFRAN ) 8 MG tablet Take 1 tablet (8 mg total) by mouth every 8 (eight) hours as needed for nausea or vomiting. Start on the third day after chemotherapy. 30 tablet 1   prochlorperazine  (COMPAZINE ) 10 MG tablet Take 1 tablet (10 mg total) by mouth every 6 (six) hours as needed for nausea or vomiting. (Patient not taking: Reported on 01/09/2024) 30 tablet 1   No current facility-administered medications for this visit.    PHYSICAL EXAMINATION: ECOG PERFORMANCE STATUS: 1 - Symptomatic but completely ambulatory  Vitals:   02/16/24 1523  BP: 122/81  Pulse: 65  Resp: 18  Temp: 97.9 F (36.6 C)  SpO2: 100%   Filed Weights   02/16/24 1523  Weight: 130 lb (59 kg)    LABORATORY DATA:  I have reviewed the data as listed    Latest Ref Rng & Units 01/26/2024    2:37 PM 01/02/2024    1:17 PM 12/09/2023    1:57 PM  CMP  Glucose 70 - 99 mg/dL 89  871  898   BUN 6 - 20 mg/dL 12  13  23   Creatinine 0.44 - 1.00 mg/dL 9.23  9.08  9.05   Sodium 135 - 145 mmol/L 137  135  137   Potassium 3.5 - 5.1 mmol/L 4.0  3.5  4.9   Chloride 98 - 111 mmol/L 103  103  104   CO2 22 - 32 mmol/L 28  29  30    Calcium  8.9 - 10.3 mg/dL 9.0  8.0  9.4   Total Protein 6.5 - 8.1 g/dL 8.0  8.0  8.0   Total Bilirubin 0.0 - 1.2 mg/dL <9.7  0.4  0.2   Alkaline Phos 38 - 126 U/L 51  45  50   AST 15 - 41 U/L 14  13  10    ALT 0 - 44 U/L 7  8  7      Lab Results  Component Value Date   WBC 6.4 02/16/2024   HGB 10.8 (L) 02/16/2024   HCT 32.2 (L) 02/16/2024    MCV 95.0 02/16/2024   PLT 379 02/16/2024   NEUTROABS PENDING 02/16/2024    ASSESSMENT & PLAN:  Breast cancer metastasized to multiple sites (HCC) 06/16/2020 stage IV: T2 N1 M1 ER/PR negative HER2 positive metastatic breast cancer (diagnosed and treated by Dr. Fernand).  Originally liver and lung metastases 08/09/2020 -11/02/2020 6 cycles of THP, on Herceptin  and Perjeta  maintenance Right breast DCIS Current treatment: Herceptin  Perjeta  maintenance since 12/14/2020 (discontinued November 2023 after she left Dr. Kathern clinic) resumed 04/16/2023   CT CAP 03/18/2023: New dense consolidation right lower lobe, minimal spillover to right middle lobe favoring bacterial pneumonia versus pulmonary hemorrhage, substantial improvement in the right breast lesions and right axillary lymph nodes, left adrenal mass  2 cm: Benign   Hospitalization 03/18/2023-03/25/2023: Pneumococcal bacteremia and sepsis   Treatment plan: Resumed Herceptin  and Perjeta  Echocardiogram completed 01/20/2023: Echo by Dr. Cherrie 01/20/23 EF 60-65%, GLS -16.9%  Echo 03/21/2023: 45 to 50% (Dr. Cherrie reviewed it and felt it was more 50 to 55% and that he was fine for us  to proceed with her treatment)   08/15/2023: CT CAP: Right breast lesions, minimal improvement right axillary lymph nodes, left axillary lymph nodes unchanged, no evidence of progression.  Left adrenal nodule, absent right kidney   10/30/2023: CT CAP: 3 masses right breast increase in size, enlargement right axillary lymph node   Current treatment: Enhertu  started 01/02/2024, today cycle 3 Chemo toxicities: Nausea that lasted a whole week Fatigue Intermittent headaches: Brain MRI 01/28/2024: Negative for metastatic disease   Recommend scans before the next treatment Return to clinic in 3 weeks for cycle 4     Orders Placed This Encounter  Procedures   CT CHEST ABDOMEN PELVIS W CONTRAST    Standing Status:   Future    Expected Date:   03/01/2024    Expiration  Date:   02/15/2025    If indicated for the ordered procedure, I authorize the administration of contrast media per Radiology protocol:   Yes    Does the patient have a contrast media/X-ray dye allergy?:   No    Preferred imaging location?:   [facility]    Release to patient:   Immediate    If indicated for the ordered procedure, I authorize the administration of oral contrast media per Radiology protocol:   No    Reason for no oral contrast::   Breast   The patient has a good understanding of the overall plan. she agrees with it. she will call with any problems that may  develop before the next visit here.  I personally spent a total of 30 minutes in the care of the patient today including preparing to see the patient, getting/reviewing separately obtained history, performing a medically appropriate exam/evaluation, counseling and educating, placing orders, referring and communicating with other health care professionals, documenting clinical information in the EHR, independently interpreting results, communicating results, and coordinating care.   Viinay K Ritaj Dullea, MD 02/16/2024    "

## 2024-02-16 NOTE — Assessment & Plan Note (Signed)
 06/16/2020 stage IV: T2 N1 M1 ER/PR negative HER2 positive metastatic breast cancer (diagnosed and treated by Dr. Fernand).  Originally liver and lung metastases 08/09/2020 -11/02/2020 6 cycles of THP, on Herceptin  and Perjeta  maintenance Right breast DCIS Current treatment: Herceptin  Perjeta  maintenance since 12/14/2020 (discontinued November 2023 after she left Dr. Kathern clinic) resumed 04/16/2023   CT CAP 03/18/2023: New dense consolidation right lower lobe, minimal spillover to right middle lobe favoring bacterial pneumonia versus pulmonary hemorrhage, substantial improvement in the right breast lesions and right axillary lymph nodes, left adrenal mass  2 cm: Benign   Hospitalization 03/18/2023-03/25/2023: Pneumococcal bacteremia and sepsis   Treatment plan: Resumed Herceptin  and Perjeta  Echocardiogram completed 01/20/2023: Echo by Dr. Cherrie 01/20/23 EF 60-65%, GLS -16.9%  Echo 03/21/2023: 45 to 50% (Dr. Cherrie reviewed it and felt it was more 50 to 55% and that he was fine for us  to proceed with her treatment)   08/15/2023: CT CAP: Right breast lesions, minimal improvement right axillary lymph nodes, left axillary lymph nodes unchanged, no evidence of progression.  Left adrenal nodule, absent right kidney   10/30/2023: CT CAP: 3 masses right breast increase in size, enlargement right axillary lymph node   Current treatment: Enhertu  started 01/02/2024, today cycle 3 Chemo toxicities: Nausea that lasted a whole week Fatigue   Recommend scans before the next treatment Return to clinic in 3 weeks for cycle 4

## 2024-02-16 NOTE — Patient Instructions (Signed)
 [facility] - A DEPT OF [facility]  Discharge Instructions: Thank you for choosing [facility] to provide your oncology and hematology care.   If you have a lab appointment with the Cancer Center, please go directly to the Cancer Center and check in at the registration area.   Wear comfortable clothing and clothing appropriate for easy access to any Portacath or PICC line.   We strive to give you quality time with your provider. You may need to reschedule your appointment if you arrive late (15 or more minutes).  Arriving late affects you and other patients whose appointments are after yours.  Also, if you miss three or more appointments without notifying the office, you may be dismissed from the clinic at the provider's discretion.      For prescription refill requests, have your pharmacy contact our office and allow 72 hours for refills to be completed.    Today you received the following chemotherapy and/or immunotherapy agents: fam-trastuzumab deruxtecan-nxki (ENHERTU     To help prevent nausea and vomiting after your treatment, we encourage you to take your nausea medication as directed.  BELOW ARE SYMPTOMS THAT SHOULD BE REPORTED IMMEDIATELY: *FEVER GREATER THAN 100.4 F (38 C) OR HIGHER *CHILLS OR SWEATING *NAUSEA AND VOMITING THAT IS NOT CONTROLLED WITH YOUR NAUSEA MEDICATION *UNUSUAL SHORTNESS OF BREATH *UNUSUAL BRUISING OR BLEEDING *URINARY PROBLEMS (pain or burning when urinating, or frequent urination) *BOWEL PROBLEMS (unusual diarrhea, constipation, pain near the anus) TENDERNESS IN MOUTH AND THROAT WITH OR WITHOUT PRESENCE OF ULCERS (sore throat, sores in mouth, or a toothache) UNUSUAL RASH, SWELLING OR PAIN  UNUSUAL VAGINAL DISCHARGE OR ITCHING   Items with * indicate a potential emergency and should be followed up as soon as possible or go to the Emergency Department if any problems should occur.  Please show the  CHEMOTHERAPY ALERT CARD or IMMUNOTHERAPY ALERT CARD at check-in to the Emergency Department and triage nurse.  Should you have questions after your visit or need to cancel or reschedule your appointment, please contact [facility] - A DEPT OF [facility]  Dept: [phone]  and follow the prompts.  Office hours are 8:00 a.m. to 4:30 p.m. Monday - Friday. Please note that voicemails left after 4:00 p.m. may not be returned until the following business day.  We are closed weekends and major holidays. You have access to a nurse at all times for urgent questions. Please call the main number to the clinic Dept: [phone] and follow the prompts.   For any non-urgent questions, you may also contact your provider using MyChart. We now offer e-Visits for anyone 51 and older to request care online for non-urgent symptoms. For details visit [email or web address].   Also download the MyChart app! Go to the app store, search "MyChart", open the app, select [facility], and log in with your MyChart username and password.

## 2024-02-17 ENCOUNTER — Telehealth: Payer: Self-pay | Admitting: Hematology and Oncology

## 2024-02-17 NOTE — Telephone Encounter (Signed)
 Left a voicemail for pt scheduling appt.

## 2024-02-23 ENCOUNTER — Emergency Department (HOSPITAL_COMMUNITY)

## 2024-02-23 ENCOUNTER — Other Ambulatory Visit: Payer: Self-pay

## 2024-02-23 ENCOUNTER — Emergency Department (HOSPITAL_COMMUNITY)
Admission: EM | Admit: 2024-02-23 | Discharge: 2024-02-24 | Attending: Emergency Medicine | Admitting: Emergency Medicine

## 2024-02-23 DIAGNOSIS — Z5321 Procedure and treatment not carried out due to patient leaving prior to being seen by health care provider: Secondary | ICD-10-CM | POA: Insufficient documentation

## 2024-02-23 DIAGNOSIS — Z853 Personal history of malignant neoplasm of breast: Secondary | ICD-10-CM | POA: Insufficient documentation

## 2024-02-23 DIAGNOSIS — R0602 Shortness of breath: Secondary | ICD-10-CM | POA: Insufficient documentation

## 2024-02-23 DIAGNOSIS — R079 Chest pain, unspecified: Secondary | ICD-10-CM | POA: Insufficient documentation

## 2024-02-23 DIAGNOSIS — R531 Weakness: Secondary | ICD-10-CM | POA: Insufficient documentation

## 2024-02-23 LAB — BASIC METABOLIC PANEL WITH GFR
Anion gap: 12 (ref 5–15)
BUN: 12 mg/dL (ref 6–20)
CO2: 24 mmol/L (ref 22–32)
Calcium: 9 mg/dL (ref 8.9–10.3)
Chloride: 97 mmol/L — ABNORMAL LOW (ref 98–111)
Creatinine, Ser: 1.13 mg/dL — ABNORMAL HIGH (ref 0.44–1.00)
GFR, Estimated: >60 mL/min
Glucose, Bld: 119 mg/dL — ABNORMAL HIGH (ref 70–99)
Potassium: 3.4 mmol/L — ABNORMAL LOW (ref 3.5–5.1)
Sodium: 133 mmol/L — ABNORMAL LOW (ref 135–145)

## 2024-02-23 LAB — CBC
HCT: 35.8 % — ABNORMAL LOW (ref 36.0–46.0)
Hemoglobin: 12.2 g/dL (ref 12.0–15.0)
MCH: 31.9 pg (ref 26.0–34.0)
MCHC: 34.1 g/dL (ref 30.0–36.0)
MCV: 93.5 fL (ref 80.0–100.0)
Platelets: 298 K/uL (ref 150–400)
RBC: 3.83 MIL/uL — ABNORMAL LOW (ref 3.87–5.11)
RDW: 14.6 % (ref 11.5–15.5)
WBC: 20.7 K/uL — ABNORMAL HIGH (ref 4.0–10.5)
nRBC: 0 % (ref 0.0–0.2)

## 2024-02-23 LAB — D-DIMER, QUANTITATIVE: D-Dimer, Quant: 2.45 ug{FEU}/mL — ABNORMAL HIGH (ref 0.00–0.50)

## 2024-02-23 LAB — RESP PANEL BY RT-PCR (RSV, FLU A&B, COVID)  RVPGX2
Influenza A by PCR: NEGATIVE
Influenza B by PCR: NEGATIVE
Resp Syncytial Virus by PCR: POSITIVE — AB
SARS Coronavirus 2 by RT PCR: NEGATIVE

## 2024-02-23 LAB — HCG, QUANTITATIVE, PREGNANCY: hCG, Beta Chain, Quant, S: <1 m[IU]/mL

## 2024-02-23 LAB — TROPONIN T, HIGH SENSITIVITY: Troponin T High Sensitivity: <15 ng/L (ref 0–19)

## 2024-02-23 MED ORDER — ONDANSETRON HCL 4 MG/2ML IJ SOLN
4.0000 mg | Freq: Once | INTRAMUSCULAR | Status: AC
  Administered 2024-02-23: 4 mg via INTRAVENOUS
  Filled 2024-02-23: qty 2

## 2024-02-23 MED ORDER — IOHEXOL 350 MG/ML SOLN
75.0000 mL | Freq: Once | INTRAVENOUS | Status: AC | PRN
  Administered 2024-02-24: 75 mL via INTRAVENOUS

## 2024-02-23 MED ORDER — KETOROLAC TROMETHAMINE 15 MG/ML IJ SOLN
15.0000 mg | Freq: Once | INTRAMUSCULAR | Status: AC
  Administered 2024-02-23: 15 mg via INTRAVENOUS
  Filled 2024-02-23: qty 1

## 2024-02-23 NOTE — ED Provider Triage Note (Signed)
 Emergency Medicine Provider Triage Evaluation Note  VESPER TRANT , a 38 y.o. female  was evaluated in triage.  Pt complains of shortness of breath and chest pain x 2 weeks.  Patient states that she is currently a cancer patient and undergoing immunotherapy - last treatment 12/22.  Patient also endorsing flulike for the past few days such as fever, cough, nausea, and vomiting. Patient in no acute distress.  Review of Systems  Positive: CP, SOB,  Negative:   Physical Exam  BP 93/67   Pulse (!) 51   Temp 100 F (37.8 C) (Oral)   Resp (!) 24   SpO2 (!) 86%  Gen:   Awake, no distress   Resp:  Normal effort  MSK:   Moves extremities without difficulty  Other:    Medical Decision Making  Medically screening exam initiated at 6:23 PM.  Appropriate orders placed.  TRACI GAFFORD was informed that the remainder of the evaluation will be completed by another provider, this initial triage assessment does not replace that evaluation, and the importance of remaining in the ED until their evaluation is complete.    Willma Duwaine CROME, [place] 02/23/24 [phone]

## 2024-02-23 NOTE — ED Triage Notes (Signed)
 Patient states she had pneumonia several months ago and feels like when she had it before.

## 2024-02-23 NOTE — ED Triage Notes (Signed)
 Patient complains of shortness of breath, complains of pain in her chest to her back. Increased weakness. Just had Immunotherapy on 12/22 for her breast cancer.  Patient cough, congested, fever, chills, N/V. Unable to eat or drink anything. Rates pain in chest 8/10.

## 2024-02-24 ENCOUNTER — Other Ambulatory Visit: Payer: Self-pay

## 2024-02-24 ENCOUNTER — Emergency Department (HOSPITAL_COMMUNITY)

## 2024-02-24 ENCOUNTER — Encounter (HOSPITAL_COMMUNITY): Payer: Self-pay

## 2024-02-24 ENCOUNTER — Inpatient Hospital Stay (HOSPITAL_COMMUNITY)
Admission: EM | Admit: 2024-02-24 | Discharge: 2024-02-26 | DRG: 871 | Disposition: A | Discharge status: Home / Self Care | Attending: Internal Medicine | Admitting: Internal Medicine

## 2024-02-24 DIAGNOSIS — Z1152 Encounter for screening for COVID-19: Secondary | ICD-10-CM

## 2024-02-24 DIAGNOSIS — N179 Acute kidney failure, unspecified: Secondary | ICD-10-CM | POA: Diagnosis present

## 2024-02-24 DIAGNOSIS — Z833 Family history of diabetes mellitus: Secondary | ICD-10-CM

## 2024-02-24 DIAGNOSIS — R5381 Other malaise: Secondary | ICD-10-CM | POA: Diagnosis present

## 2024-02-24 DIAGNOSIS — J121 Respiratory syncytial virus pneumonia: Secondary | ICD-10-CM | POA: Diagnosis present

## 2024-02-24 DIAGNOSIS — E860 Dehydration: Secondary | ICD-10-CM | POA: Diagnosis present

## 2024-02-24 DIAGNOSIS — A419 Sepsis, unspecified organism: Principal | ICD-10-CM | POA: Diagnosis present

## 2024-02-24 DIAGNOSIS — D849 Immunodeficiency, unspecified: Secondary | ICD-10-CM | POA: Diagnosis present

## 2024-02-24 DIAGNOSIS — Z8249 Family history of ischemic heart disease and other diseases of the circulatory system: Secondary | ICD-10-CM | POA: Diagnosis not present

## 2024-02-24 DIAGNOSIS — R6520 Severe sepsis without septic shock: Secondary | ICD-10-CM | POA: Diagnosis present

## 2024-02-24 DIAGNOSIS — Z825 Family history of asthma and other chronic lower respiratory diseases: Secondary | ICD-10-CM | POA: Diagnosis not present

## 2024-02-24 DIAGNOSIS — F17210 Nicotine dependence, cigarettes, uncomplicated: Secondary | ICD-10-CM | POA: Diagnosis present

## 2024-02-24 DIAGNOSIS — R0682 Tachypnea, not elsewhere classified: Secondary | ICD-10-CM | POA: Diagnosis present

## 2024-02-24 DIAGNOSIS — J189 Pneumonia, unspecified organism: Principal | ICD-10-CM

## 2024-02-24 DIAGNOSIS — Z853 Personal history of malignant neoplasm of breast: Secondary | ICD-10-CM | POA: Diagnosis not present

## 2024-02-24 DIAGNOSIS — Z789 Other specified health status: Secondary | ICD-10-CM | POA: Diagnosis not present

## 2024-02-24 DIAGNOSIS — J159 Unspecified bacterial pneumonia: Secondary | ICD-10-CM | POA: Diagnosis present

## 2024-02-24 DIAGNOSIS — R0989 Other specified symptoms and signs involving the circulatory and respiratory systems: Secondary | ICD-10-CM | POA: Diagnosis present

## 2024-02-24 DIAGNOSIS — M549 Dorsalgia, unspecified: Secondary | ICD-10-CM | POA: Diagnosis present

## 2024-02-24 DIAGNOSIS — C50919 Malignant neoplasm of unspecified site of unspecified female breast: Secondary | ICD-10-CM | POA: Diagnosis present

## 2024-02-24 DIAGNOSIS — I959 Hypotension, unspecified: Secondary | ICD-10-CM | POA: Diagnosis present

## 2024-02-24 DIAGNOSIS — Q602 Renal agenesis, unspecified: Secondary | ICD-10-CM

## 2024-02-24 DIAGNOSIS — E876 Hypokalemia: Secondary | ICD-10-CM | POA: Diagnosis present

## 2024-02-24 DIAGNOSIS — J188 Other pneumonia, unspecified organism: Secondary | ICD-10-CM | POA: Diagnosis not present

## 2024-02-24 LAB — CBC WITH DIFFERENTIAL/PLATELET
Abs Immature Granulocytes: 0.86 K/uL — ABNORMAL HIGH (ref 0.00–0.07)
Basophils Absolute: 0.1 K/uL (ref 0.0–0.1)
Basophils Relative: 0 %
Eosinophils Absolute: 0 K/uL (ref 0.0–0.5)
Eosinophils Relative: 0 %
HCT: 32.5 % — ABNORMAL LOW (ref 36.0–46.0)
Hemoglobin: 11.2 g/dL — ABNORMAL LOW (ref 12.0–15.0)
Immature Granulocytes: 3 %
Lymphocytes Relative: 3 %
Lymphs Abs: 1.1 K/uL (ref 0.7–4.0)
MCH: 31.9 pg (ref 26.0–34.0)
MCHC: 34.5 g/dL (ref 30.0–36.0)
MCV: 92.6 fL (ref 80.0–100.0)
Monocytes Absolute: 0.9 K/uL (ref 0.1–1.0)
Monocytes Relative: 3 %
Neutro Abs: 29.1 K/uL — ABNORMAL HIGH (ref 1.7–7.7)
Neutrophils Relative %: 91 %
Platelets: 284 K/uL (ref 150–400)
RBC: 3.51 MIL/uL — ABNORMAL LOW (ref 3.87–5.11)
RDW: 14.7 % (ref 11.5–15.5)
WBC: 32 K/uL — ABNORMAL HIGH (ref 4.0–10.5)
nRBC: 0 % (ref 0.0–0.2)

## 2024-02-24 LAB — COMPREHENSIVE METABOLIC PANEL WITH GFR
ALT: 13 U/L (ref 0–44)
AST: 22 U/L (ref 15–41)
Albumin: 3.4 g/dL — ABNORMAL LOW (ref 3.5–5.0)
Alkaline Phosphatase: 81 U/L (ref 38–126)
Anion gap: 12 (ref 5–15)
BUN: 16 mg/dL (ref 6–20)
CO2: 22 mmol/L (ref 22–32)
Calcium: 8.9 mg/dL (ref 8.9–10.3)
Chloride: 97 mmol/L — ABNORMAL LOW (ref 98–111)
Creatinine, Ser: 1.53 mg/dL — ABNORMAL HIGH (ref 0.44–1.00)
GFR, Estimated: 44 mL/min — ABNORMAL LOW
Glucose, Bld: 136 mg/dL — ABNORMAL HIGH (ref 70–99)
Potassium: 3.4 mmol/L — ABNORMAL LOW (ref 3.5–5.1)
Sodium: 132 mmol/L — ABNORMAL LOW (ref 135–145)
Total Bilirubin: 0.7 mg/dL (ref 0.0–1.2)
Total Protein: 8.2 g/dL — ABNORMAL HIGH (ref 6.5–8.1)

## 2024-02-24 LAB — I-STAT CG4 LACTIC ACID, ED: Lactic Acid, Venous: 1.3 mmol/L (ref 0.5–1.9)

## 2024-02-24 MED ORDER — SODIUM CHLORIDE 0.9 % IV SOLN
2.0000 g | Freq: Once | INTRAVENOUS | Status: AC
  Administered 2024-02-24: 2 g via INTRAVENOUS
  Filled 2024-02-24: qty 20

## 2024-02-24 MED ORDER — OXYCODONE HCL 5 MG PO TABS
5.0000 mg | ORAL_TABLET | ORAL | Status: DC | PRN
  Administered 2024-02-24 – 2024-02-25 (×2): 5 mg via ORAL
  Filled 2024-02-24 (×2): qty 1

## 2024-02-24 MED ORDER — ENOXAPARIN SODIUM 40 MG/0.4ML IJ SOSY
40.0000 mg | PREFILLED_SYRINGE | INTRAMUSCULAR | Status: DC
  Filled 2024-02-24: qty 0.4

## 2024-02-24 MED ORDER — HYDROMORPHONE HCL 1 MG/ML IJ SOLN
0.5000 mg | Freq: Once | INTRAMUSCULAR | Status: AC
  Administered 2024-02-24: 0.5 mg via INTRAVENOUS
  Filled 2024-02-24: qty 1

## 2024-02-24 MED ORDER — IBUPROFEN 800 MG PO TABS
800.0000 mg | ORAL_TABLET | Freq: Once | ORAL | Status: AC
  Administered 2024-02-24: 800 mg via ORAL
  Filled 2024-02-24: qty 1

## 2024-02-24 MED ORDER — AZITHROMYCIN 250 MG PO TABS
500.0000 mg | ORAL_TABLET | Freq: Every day | ORAL | Status: DC
  Administered 2024-02-25: 500 mg via ORAL
  Filled 2024-02-24: qty 2

## 2024-02-24 MED ORDER — ACETAMINOPHEN 325 MG PO TABS
650.0000 mg | ORAL_TABLET | Freq: Four times a day (QID) | ORAL | Status: DC | PRN
  Administered 2024-02-24: 650 mg via ORAL
  Filled 2024-02-24: qty 2

## 2024-02-24 MED ORDER — SODIUM CHLORIDE 0.9 % IV SOLN
2.0000 g | INTRAVENOUS | Status: DC
  Administered 2024-02-25: 2 g via INTRAVENOUS
  Filled 2024-02-24: qty 20

## 2024-02-24 MED ORDER — SODIUM CHLORIDE 0.9 % IV BOLUS
30.0000 mL/kg | Freq: Once | INTRAVENOUS | Status: AC
  Administered 2024-02-24: 1770 mL via INTRAVENOUS

## 2024-02-24 MED ORDER — SODIUM CHLORIDE 0.9 % IV SOLN
500.0000 mg | Freq: Once | INTRAVENOUS | Status: AC
  Administered 2024-02-24: 500 mg via INTRAVENOUS
  Filled 2024-02-24: qty 5

## 2024-02-24 MED ORDER — ONDANSETRON HCL 8 MG PO TABS: 8.0000 mg | ORAL_TABLET | Freq: Three times a day (TID) | ORAL | Status: DC | PRN

## 2024-02-24 MED ORDER — ONDANSETRON HCL 4 MG PO TABS: 4.0000 mg | ORAL_TABLET | Freq: Four times a day (QID) | ORAL | Status: DC | PRN

## 2024-02-24 MED ORDER — LACTATED RINGERS IV SOLN: INTRAVENOUS | Status: DC

## 2024-02-24 MED ORDER — ACETAMINOPHEN 650 MG RE SUPP: 650.0000 mg | Freq: Four times a day (QID) | RECTAL | Status: DC | PRN

## 2024-02-24 MED ORDER — ONDANSETRON HCL 4 MG/2ML IJ SOLN: 4.0000 mg | Freq: Four times a day (QID) | INTRAMUSCULAR | Status: DC | PRN

## 2024-02-24 NOTE — H&P (Signed)
 " History and Physical    Connie Wells FMW:[id number] DOB: 06/25/1985 DOA: 02/24/2024  PCP: Patient, No Pcp Per   Chief Complaint: sob, fever  HPI: Connie Wells is a 38 y.o. female with medical history significant of metastatic breast cancer on immunotherapy who presents emergency department with shortness of breath and fever.  Patient had several days of cough worsening shortness of breath.  Came to the emergency department on 12/29 however left due to long wait times.  Patient returned today due to persistent symptoms.  On arrival she was afebrile and hemodynamically stable.  Labs were obtained which showed creatinine 1.5 baseline around 1, potassium 3.4, WBC 32, hemoglobin 11.2, blood cultures pending.  Lactic acid 1.3.  Patient underwent chest x-ray which showed concern for pneumonia.  CTA pulm embolism study demonstrated similar findings.  Patient was started on ceftriaxone  and azithromycin  admitted for further workup.   Review of Systems: Review of Systems  Constitutional: Negative.   HENT: Negative.    Eyes: Negative.   Respiratory: Negative.    Cardiovascular: Negative.   Gastrointestinal: Negative.   Genitourinary: Negative.   Musculoskeletal: Negative.   Skin: Negative.   Neurological: Negative.   Endo/Heme/Allergies: Negative.   Psychiatric/Behavioral: Negative.       As per HPI otherwise 10 point review of systems negative.   Allergies[1]  Past Medical History:  Diagnosis Date   Herpes genitalia    Solitary kidney     Past Surgical History:  Procedure Laterality Date   DILATION AND EVACUATION     ECTOPIC PREGNANCY SURGERY     TRANSESOPHAGEAL ECHOCARDIOGRAM (CATH LAB) N/A 03/25/2023   Procedure: TRANSESOPHAGEAL ECHOCARDIOGRAM;  Surgeon: Raford Riggs, MD;  Location: [facility] INVASIVE CV LAB;  Service: Cardiovascular;  Laterality: N/A;     reports that she has been smoking cigarettes. She has a 2.5 pack-year smoking history. She has never used  smokeless tobacco. She reports current drug use. Drug: Marijuana. She reports that she does not drink alcohol.  Family History  Problem Relation Age of Onset   Diabetes Mother    Hypertension Mother    Diabetes Maternal Aunt    Asthma Brother     Prior to Admission medications  Medication Sig Start Date End Date Taking? Authorizing Provider  ergocalciferol  (VITAMIN D2) 1.25 MG (50000 UT) capsule Take 1 capsule (50,000 Units total) by mouth once a week. Patient not taking: Reported on 01/09/2024 11/03/23   Crawford Morna Pickle, NP  folic acid  (FOLVITE ) 1 MG tablet Take 1 tablet (1 mg total) by mouth daily. Patient not taking: Reported on 01/09/2024 03/26/23   Regalado, Owen A, MD  losartan  (COZAAR ) 25 MG tablet Take 1 tablet (25 mg total) by mouth daily. Patient not taking: Reported on 01/09/2024 08/06/23   Colletta Manuelita Garre, PA-C  metoprolol  succinate (TOPROL  XL) 25 MG 24 hr tablet Take 1 tablet (25 mg total) by mouth daily. 01/09/24   Bensimhon, Toribio JONELLE, MD  ondansetron  (ZOFRAN ) 8 MG tablet Take 1 tablet (8 mg total) by mouth every 8 (eight) hours as needed for nausea or vomiting. Start on the third day after chemotherapy. 12/10/23   Odean Potts, MD  prochlorperazine  (COMPAZINE ) 10 MG tablet Take 1 tablet (10 mg total) by mouth every 6 (six) hours as needed for nausea or vomiting. Patient not taking: Reported on 01/09/2024 12/10/23   Odean Potts, MD    Physical Exam: Vitals:   02/24/24 1900 02/24/24 2000 02/24/24 2030 02/24/24 2128  BP: 90/66 106/82 111/79  Pulse: (!) 106  (!) 110   Resp: (!) 27 16 (!) 27   Temp:    98.7 F (37.1 C)  TempSrc:    Oral  SpO2: 95%  100%    Physical Exam Vitals reviewed.  Constitutional:      Appearance: She is normal weight.  HENT:     Head: Normocephalic.     Nose: Nose normal.     Mouth/Throat:     Mouth: Mucous membranes are moist.     Pharynx: Oropharynx is clear.  Eyes:     Conjunctiva/sclera: Conjunctivae normal.      Pupils: Pupils are equal, round, and reactive to light.  Cardiovascular:     Rate and Rhythm: Normal rate and regular rhythm.     Pulses: Normal pulses.     Heart sounds: Normal heart sounds.  Pulmonary:     Effort: Pulmonary effort is normal.  Abdominal:     General: Abdomen is flat. Bowel sounds are normal.  Musculoskeletal:        General: Normal range of motion.     Cervical back: Normal range of motion.  Skin:    General: Skin is warm.     Capillary Refill: Capillary refill takes less than 2 seconds.  Neurological:     General: No focal deficit present.     Mental Status: She is alert. Mental status is at baseline.  Psychiatric:        Mood and Affect: Mood normal.       Labs on Admission: I have personally reviewed the patients's labs and imaging studies.  Assessment/Plan Principal Problem:   Community acquired bacterial pneumonia   # Bacterial commune acquired pneumonia - Patient presented with cough and fever -Infiltrate on chest x-ray and CT  Plan: Continue ceftriaxone  and azithromycin   # Metastatic breast cancer-continue to monitor   Admission status: Inpatient Med-Surg  Certification: The appropriate patient status for this patient is INPATIENT. Inpatient status is judged to be reasonable and necessary in order to provide the required intensity of service to ensure the patient's safety. The patient's presenting symptoms, physical exam findings, and initial radiographic and laboratory data in the context of their chronic comorbidities is felt to place them at high risk for further clinical deterioration. Furthermore, it is not anticipated that the patient will be medically stable for discharge from the hospital within 2 midnights of admission.   * I certify that at the point of admission it is my clinical judgment that the patient will require inpatient hospital care spanning beyond 2 midnights from the point of admission due to high intensity of service, high  risk for further deterioration and high frequency of surveillance required.DEWAINE Lamar Dess MD [facility] If 7PM-7AM, please contact night-coverage [email or web address]  02/24/2024, 10:03 PM        [1] No Known Allergies  "

## 2024-02-24 NOTE — Sepsis Progress Note (Signed)
 Code Sepsis protocol being monitored by eLink.

## 2024-02-24 NOTE — ED Provider Notes (Signed)
 " [place] [facility] DE[facility]MENT AT [facility] Provider Note  CSN: [id number] Arrival date & time: 02/24/24 1628  Chief Complaint(s) Code Sepsis  HPI Connie Wells is a 38 y.o. female history of metastatic breast cancer, currently on immunotherapy presenting with shortness of breath, fever.  Patient reports that she has had a few days of cough, fevers, shortness of breath, some anterior chest pain with coughing, malaise.  She actually came to the emergency department yesterday for the symptoms and had workup including CT scan showing pneumonia but left due to wait time.  She returns today with paramedics given worsening symptoms.   Past Medical History Past Medical History:  Diagnosis Date   Herpes genitalia    Solitary kidney    Patient Active Problem List   Diagnosis Date Noted   Community acquired bacterial pneumonia 02/24/2024   Pneumococcal bacteremia 03/24/2023   Sepsis due to pneumonia (HCC) 03/19/2023   AKI (acute kidney injury) 03/19/2023   History of breast cancer 03/19/2023   GAD (generalized anxiety disorder) 03/19/2023   CAP (community acquired pneumonia) 03/19/2023   Breast cancer metastasized to multiple sites (HCC) 08/21/2022   Cellulitis of pharynx    Pharyngitis 04/20/2015   Postpartum care and examination 12/27/2013   Contraception management 12/27/2013   GBS bacteriuria 10/14/2013   Drug use complicating pregnancy in second trimester 07/06/2013   Hx of pyelonephritis 06/08/2013   HSV-2 infection complicating pregnancy 06/08/2013   SOLITARY KIDNEY 05/28/2006   ANA POSITIVE, HX OF 05/02/2006   Home Medication(s) Prior to Admission medications  Medication Sig Start Date End Date Taking? Authorizing Provider  ergocalciferol  (VITAMIN D2) 1.25 MG (50000 UT) capsule Take 1 capsule (50,000 Units total) by mouth once a week. Patient not taking: Reported on 01/09/2024 11/03/23   Crawford Morna Pickle, NP  folic acid  (FOLVITE ) 1 MG tablet Take  1 tablet (1 mg total) by mouth daily. Patient not taking: Reported on 01/09/2024 03/26/23   Regalado, Owen A, MD  losartan  (COZAAR ) 25 MG tablet Take 1 tablet (25 mg total) by mouth daily. Patient not taking: Reported on 01/09/2024 08/06/23   Colletta Manuelita Garre, PA-C  metoprolol  succinate (TOPROL  XL) 25 MG 24 hr tablet Take 1 tablet (25 mg total) by mouth daily. 01/09/24   Bensimhon, Toribio JONELLE, MD  ondansetron  (ZOFRAN ) 8 MG tablet Take 1 tablet (8 mg total) by mouth every 8 (eight) hours as needed for nausea or vomiting. Start on the third day after chemotherapy. 12/10/23   Odean Potts, MD  prochlorperazine  (COMPAZINE ) 10 MG tablet Take 1 tablet (10 mg total) by mouth every 6 (six) hours as needed for nausea or vomiting. Patient not taking: Reported on 01/09/2024 12/10/23   Odean Potts, MD                                                                                                                                    Past Surgical  History Past Surgical History:  Procedure Laterality Date   DILATION AND EVACUATION     ECTOPIC PREGNANCY SURGERY     TRANSESOPHAGEAL ECHOCARDIOGRAM (CATH LAB) N/A 03/25/2023   Procedure: TRANSESOPHAGEAL ECHOCARDIOGRAM;  Surgeon: Raford Riggs, MD;  Location: [facility] INVASIVE CV LAB;  Service: Cardiovascular;  Laterality: N/A;   Family History Family History  Problem Relation Age of Onset   Diabetes Mother    Hypertension Mother    Diabetes Maternal Aunt    Asthma Brother     Social History Social History[1] Allergies Patient has no known allergies.  Review of Systems Review of Systems  All other systems reviewed and are negative.   Physical Exam Vital Signs  I have reviewed the triage vital signs BP 90/66 (BP Location: Right Arm)   Pulse (!) 106   Temp (!) 100.8 F (38.2 C) (Oral)   Resp (!) 27   SpO2 95%  Physical Exam Vitals and nursing note reviewed.  Constitutional:      General: She is not in acute distress.    Appearance: She is  well-developed.  HENT:     Head: Normocephalic and atraumatic.     Mouth/Throat:     Mouth: Mucous membranes are moist.  Eyes:     Pupils: Pupils are equal, round, and reactive to light.  Cardiovascular:     Rate and Rhythm: Regular rhythm. Tachycardia present.     Heart sounds: No murmur heard. Pulmonary:     Effort: Tachypnea and accessory muscle usage present. No respiratory distress.     Breath sounds: Examination of the right-lower field reveals rales. Examination of the left-lower field reveals rales. Rales present.  Abdominal:     General: Abdomen is flat.     Palpations: Abdomen is soft.     Tenderness: There is no abdominal tenderness.  Musculoskeletal:        General: No tenderness.     Right lower leg: No edema.     Left lower leg: No edema.  Skin:    General: Skin is warm and dry.  Neurological:     General: No focal deficit present.     Mental Status: She is alert. Mental status is at baseline.  Psychiatric:        Mood and Affect: Mood normal.        Behavior: Behavior normal.     ED Results and Treatments Labs (all labs ordered are listed, but only abnormal results are displayed) Labs Reviewed  COMPREHENSIVE METABOLIC PANEL WITH GFR - Abnormal; Notable for the following components:      Result Value   Sodium 132 (*)    Potassium 3.4 (*)    Chloride 97 (*)    Glucose, Bld 136 (*)    Creatinine, Ser 1.53 (*)    Total Protein 8.2 (*)    Albumin  3.4 (*)    GFR, Estimated 44 (*)    All other components within normal limits  CBC WITH DIFFERENTIAL/PLATELET - Abnormal; Notable for the following components:   WBC 32.0 (*)    RBC 3.51 (*)    Hemoglobin 11.2 (*)    HCT 32.5 (*)    Neutro Abs 29.1 (*)    Abs Immature Granulocytes 0.86 (*)    All other components within normal limits  CULTURE, BLOOD (ROUTINE X 2)  CULTURE, BLOOD (ROUTINE X 2)  I-STAT CG4 LACTIC ACID, ED  Radiology DG Chest Portable 1 View Result Date: 02/24/2024 EXAM: 1 VIEW(S) XRAY OF THE CHEST 02/24/2024 05:51:00 PM COMPARISON: 02/23/2024 CLINICAL HISTORY: dyspnea FINDINGS: LINES, TUBES AND DEVICES: Left Port-A-Cath in place with tip over mid SVC. LUNGS AND PLEURA: Stable left basilar consolidation suspicious for pneumonia. Developing right basilar infiltrate. No pleural effusion. No pneumothorax. HEART AND MEDIASTINUM: No acute abnormality of the cardiac and mediastinal silhouettes. BONES AND SOFT TISSUES: No acute osseous abnormality. IMPRESSION: 1. Stable left basilar consolidation suspicious for pneumonia. 2. Developing right basilar infiltrate. 3. Left Port-A-Cath with tip projecting over the mid SVC. Electronically signed by: Dorethia Molt MD 02/24/2024 07:16 PM EST RP Workstation: [id number]   CT Angio Chest PE W and/or Wo Contrast Result Date: 02/24/2024 EXAM: CTA CHEST 02/24/2024 12:10:01 AM TECHNIQUE: CTA of the chest was performed without and with the administration of 75 mL of iohexol  (OMNIPAQUE ) 350 MG/ML injection. Multiplanar reformatted images are provided for review. MIP images are provided for review. Automated exposure control, iterative reconstruction, and/or weight based adjustment of the mA/kV was utilized to reduce the radiation dose to as low as reasonably achievable. COMPARISON: 11/17/2023 CLINICAL HISTORY: Pulmonary embolism (PE) suspected, low to intermediate prob, positive D-dimer. FINDINGS: PULMONARY ARTERIES: Pulmonary arteries are adequately opacified for evaluation. No acute pulmonary embolus. Main pulmonary artery is normal in caliber. MEDIASTINUM: The heart and pericardium demonstrate no acute abnormality. There is no acute abnormality of the thoracic aorta. LYMPH NODES: No mediastinal, hilar or axillary lymphadenopathy. LUNGS AND PLEURA: Multifocal opacity in the left lower lobe and the right middle and lower lobes, likely infection. No  evidence of pleural effusion or pneumothorax. UPPER ABDOMEN: Limited images of the upper abdomen are unremarkable. SOFT TISSUES AND BONES: No acute bone or soft tissue abnormality. IMPRESSION: 1. No pulmonary embolism. 2. Multifocal opacity in the left lower lobe and the right middle and lower lobes, likely infection. Electronically signed by: Franky Stanford MD 02/24/2024 02:21 AM EST RP Workstation: [id number]    Pertinent labs & imaging results that were available during my care of the patient were reviewed by me and considered in my medical decision making (see MDM for details).  Medications Ordered in ED Medications  lactated ringers  infusion ( Intravenous New Bag/Given 02/24/24 2008)  sodium chloride  0.9 % bolus 1,770 mL (0 mLs Intravenous Stopped 02/24/24 2012)  cefTRIAXone  (ROCEPHIN ) 2 g in sodium chloride  0.9 % 100 mL IVPB (0 g Intravenous Stopped 02/24/24 1821)  azithromycin  (ZITHROMAX ) 500 mg in sodium chloride  0.9 % 250 mL IVPB (0 mg Intravenous Stopped 02/24/24 2003)  HYDROmorphone  (DILAUDID ) injection 0.5 mg (0.5 mg Intravenous Given 02/24/24 1751)                                                                                                                                     Procedures Procedures  (including critical care time)  Medical Decision Making / ED Course   MDM:  38 year old presenting  to the emergency department with dyspnea.  Patient in no acute distress but with tachypnea, abnormal breath sounds and tachycardia.  Borderline hypotension present.  Reviewed workup from yesterday which shows signs of pneumonia on CTA of the chest.  She also had leukocytosis.  This would explain her symptoms.  She was also RSV positive.  Labs today with worsening leukocytosis as well as evidence of mild dehydration.  She has received sepsis fluid bolus as she meets criteria for sepsis.  She has been covered with ceftriaxone  and azithromycin .  Discussed with the hospitalist Dr. Dena  who will admit patient for further inpatient management of pneumonia/sepsis.      Additional history obtained: -Additional history obtained from ems -External records from outside source obtained and reviewed including: Chart review including previous notes, labs, imaging, consultation notes including prior notes    Lab Tests: -I ordered, reviewed, and interpreted labs.   The pertinent results include:   Labs Reviewed  COMPREHENSIVE METABOLIC PANEL WITH GFR - Abnormal; Notable for the following components:      Result Value   Sodium 132 (*)    Potassium 3.4 (*)    Chloride 97 (*)    Glucose, Bld 136 (*)    Creatinine, Ser 1.53 (*)    Total Protein 8.2 (*)    Albumin  3.4 (*)    GFR, Estimated 44 (*)    All other components within normal limits  CBC WITH DIFFERENTIAL/PLATELET - Abnormal; Notable for the following components:   WBC 32.0 (*)    RBC 3.51 (*)    Hemoglobin 11.2 (*)    HCT 32.5 (*)    Neutro Abs 29.1 (*)    Abs Immature Granulocytes 0.86 (*)    All other components within normal limits  CULTURE, BLOOD (ROUTINE X 2)  CULTURE, BLOOD (ROUTINE X 2)  I-STAT CG4 LACTIC ACID, ED    Notable for AKI, leukocytosis   EKG   EKG Interpretation Date/Time:  Tuesday February 24 2024 16:44:09 EST Ventricular Rate:  128 PR Interval:  136 QRS Duration:  87 QT Interval:  285 QTC Calculation: 416 R Axis:   68  Text Interpretation: Sinus tachycardia Prominent P waves, nondiagnostic RSR' in V1 or V2, probably normal variant Left ventricular hypertrophy Confirmed by Francesca Fallow ([id number]) on 02/24/2024 9:05:23 PM         Imaging Studies ordered: I ordered imaging studies including CXR On my interpretation imaging demonstrates PNA I independently visualized and interpreted imaging. I agree with the radiologist interpretation   Medicines ordered and prescription drug management: Meds ordered this encounter  Medications   sodium chloride  0.9 % bolus 1,770 mL    lactated ringers  infusion   cefTRIAXone  (ROCEPHIN ) 2 g in sodium chloride  0.9 % 100 mL IVPB    Antibiotic Indication::   CAP   azithromycin  (ZITHROMAX ) 500 mg in sodium chloride  0.9 % 250 mL IVPB    Antibiotic Indication::   CAP   HYDROmorphone  (DILAUDID ) injection 0.5 mg    -I have reviewed the patients home medicines and have made adjustments as needed   Consultations Obtained: I requested consultation with the hospitalist,  and discussed lab and imaging findings as well as pertinent plan - they recommend: admission   Cardiac Monitoring: The patient was maintained on a cardiac monitor.  I personally viewed and interpreted the cardiac monitored which showed an underlying rhythm of: sinus tachycardia   Reevaluation: After the interventions noted above, I reevaluated the patient and found that their symptoms have improved  Co morbidities that complicate the patient evaluation  Past Medical History:  Diagnosis Date   Herpes genitalia    Solitary kidney       Dispostion: Disposition decision including need for hospitalization was considered, and patient admitted to the hospital.    Final Clinical Impression(s) / ED Diagnoses Final diagnoses:  Community acquired pneumonia, unspecified laterality     This chart was dictated using voice recognition software.  Despite best efforts to proofread,  errors can occur which can change the documentation meaning.     [1]  Social History Tobacco Use   Smoking status: Every Day    Current packs/day: 0.50    Average packs/day: 0.5 packs/day for 5.0 years (2.5 ttl pk-yrs)    Types: Cigarettes   Smokeless tobacco: Never  Substance Use Topics   Alcohol use: No    Comment: Occassional   Drug use: Yes    Types: Marijuana     Francesca Elsie CROME, MD 02/24/24 2106  "

## 2024-02-24 NOTE — ED Notes (Signed)
 Pt states that she wishes to leave and follow up with oncologist/PMD in the morning. Pt encouraged to stay, but adamant that we remove her IV's so she can leave. She is requesting Ibuprofen .

## 2024-02-24 NOTE — ED Triage Notes (Signed)
 Pt BIBA from home, c/o near syncopal episode, also c/o back pain and chest pain. Seen yesterday for the same. 650mg  tylenol    BP 94/60 CBG 127

## 2024-02-25 ENCOUNTER — Encounter (HOSPITAL_COMMUNITY): Payer: Self-pay | Admitting: Internal Medicine

## 2024-02-25 ENCOUNTER — Other Ambulatory Visit: Payer: Self-pay

## 2024-02-25 ENCOUNTER — Telehealth: Payer: Self-pay

## 2024-02-25 DIAGNOSIS — E876 Hypokalemia: Secondary | ICD-10-CM | POA: Insufficient documentation

## 2024-02-25 DIAGNOSIS — J121 Respiratory syncytial virus pneumonia: Secondary | ICD-10-CM

## 2024-02-25 LAB — CBC
HCT: 26.8 % — ABNORMAL LOW (ref 36.0–46.0)
Hemoglobin: 9.1 g/dL — ABNORMAL LOW (ref 12.0–15.0)
MCH: 32.6 pg (ref 26.0–34.0)
MCHC: 34 g/dL (ref 30.0–36.0)
MCV: 96.1 fL (ref 80.0–100.0)
Platelets: 260 K/uL (ref 150–400)
RBC: 2.79 MIL/uL — ABNORMAL LOW (ref 3.87–5.11)
RDW: 15.1 % (ref 11.5–15.5)
WBC: 31.4 K/uL — ABNORMAL HIGH (ref 4.0–10.5)
nRBC: 0 % (ref 0.0–0.2)

## 2024-02-25 LAB — BASIC METABOLIC PANEL WITH GFR
Anion gap: 12 (ref 5–15)
BUN: 10 mg/dL (ref 6–20)
CO2: 20 mmol/L — ABNORMAL LOW (ref 22–32)
Calcium: 7.8 mg/dL — ABNORMAL LOW (ref 8.9–10.3)
Chloride: 103 mmol/L (ref 98–111)
Creatinine, Ser: 0.98 mg/dL (ref 0.44–1.00)
GFR, Estimated: >60 mL/min
Glucose, Bld: 106 mg/dL — ABNORMAL HIGH (ref 70–99)
Potassium: 3.6 mmol/L (ref 3.5–5.1)
Sodium: 135 mmol/L (ref 135–145)

## 2024-02-25 LAB — PROTIME-INR
INR: 1.2 (ref 0.8–1.2)
Prothrombin Time: 16.1 s — ABNORMAL HIGH (ref 11.4–15.2)

## 2024-02-25 MED ORDER — POTASSIUM CHLORIDE CRYS ER 20 MEQ PO TBCR
40.0000 meq | EXTENDED_RELEASE_TABLET | Freq: Once | ORAL | Status: AC
  Administered 2024-02-25: 40 meq via ORAL
  Filled 2024-02-25: qty 2

## 2024-02-25 MED ORDER — SODIUM CHLORIDE 0.9% FLUSH: 10.0000 mL | INTRAVENOUS | Status: DC | PRN

## 2024-02-25 MED ORDER — OXYCODONE HCL 5 MG PO TABS
10.0000 mg | ORAL_TABLET | Freq: Once | ORAL | Status: AC
  Administered 2024-02-25: 10 mg via ORAL
  Filled 2024-02-25: qty 2

## 2024-02-25 MED ORDER — SODIUM CHLORIDE 0.9% FLUSH
10.0000 mL | Freq: Two times a day (BID) | INTRAVENOUS | Status: DC
  Administered 2024-02-25: 10 mL

## 2024-02-25 MED ORDER — OXYCODONE HCL 5 MG PO TABS
10.0000 mg | ORAL_TABLET | ORAL | Status: DC | PRN
  Administered 2024-02-25: 10 mg via ORAL
  Filled 2024-02-25: qty 2

## 2024-02-25 MED ORDER — CHLORHEXIDINE GLUCONATE CLOTH 2 % EX PADS
6.0000 | MEDICATED_PAD | Freq: Every day | CUTANEOUS | Status: DC
  Administered 2024-02-25: 6 via TOPICAL

## 2024-02-25 NOTE — Assessment & Plan Note (Signed)
 02/25/2024 admitting K of 3.4. today K of 3.6. will give extra dose of Kcl 40 meq x 1 today.

## 2024-02-25 NOTE — Assessment & Plan Note (Addendum)
 02/25/2024 sepsis with acute organ dysfunction(AKI) due to pneumonia present on admission. Fever of 103. HR 120, WBC 32K.

## 2024-02-25 NOTE — Hospital Course (Addendum)
 CC: c/o near syncopal episode, also c/o back pain and chest pain  HPI:  Connie Wells is a 37 y.o. female with medical history significant of metastatic breast cancer on immunotherapy who presents emergency department with shortness of breath and fever.  Patient had several days of cough worsening shortness of breath.  Came to the emergency department on 12/29 however left due to long wait times.  Patient returned today due to persistent symptoms.  On arrival she was afebrile and hemodynamically stable.  Labs were obtained which showed creatinine 1.5 baseline around 1, potassium 3.4, WBC 32, hemoglobin 11.2, blood cultures pending.  Lactic acid 1.3.  Patient underwent chest x-ray which showed concern for pneumonia.  CTA pulm embolism study demonstrated similar findings.  Patient was started on ceftriaxone  and azithromycin  admitted for further workup.   Significant Events: Admitted 02/24/2024 for multifocal pneumonia 02-25-2024 tested positive for RSV  Admission Labs: WBC 32, HgB 11.2, plt 284 Lactic acid 1.3 Na 132, K 3.4, CO2 of 22, BUN 16, scr 1.53, glu 136 T. Prot 8.2, alb 3.4, AST 22, ALT 13, alk phos 81, t. Bili 0.7  Admission Imaging Studies: CTPA No pulmonary embolism. 2. Multifocal opacity in the left lower lobe and the right middle and lower lobes, likely infection. PCXR Stable left basilar consolidation suspicious for pneumonia. 2. Developing right basilar infiltrate. 3. Left Port-A-Cath with tip projecting over the mid SVC.  Significant Labs: RSV POSITIVE  Significant Imaging Studies:   Antibiotic Therapy: Anti-infectives (From admission, onward)    Start     Dose/Rate Route Frequency Ordered Stop   02/25/24 1900  azithromycin  (ZITHROMAX ) tablet 500 mg        500 mg Oral Daily 02/24/24 2212     02/25/24 1800  cefTRIAXone  (ROCEPHIN ) 2 g in sodium chloride  0.9 % 100 mL IVPB        2 g 200 mL/hr over 30 Minutes Intravenous Every 24 hours 02/24/24 2212     02/24/24  1745  cefTRIAXone  (ROCEPHIN ) 2 g in sodium chloride  0.9 % 100 mL IVPB        2 g 200 mL/hr over 30 Minutes Intravenous Once 02/24/24 1736 02/24/24 1821   02/24/24 1745  azithromycin  (ZITHROMAX ) 500 mg in sodium chloride  0.9 % 250 mL IVPB        500 mg 250 mL/hr over 60 Minutes Intravenous  Once 02/24/24 1736 02/24/24 2003       Procedures:   Consultants: Heme/onc notified of pt's admission.

## 2024-02-25 NOTE — Assessment & Plan Note (Addendum)
 02/25/2024 continue rocephin  IV 2 gram daily and po zithromax . Check legionella and strep pneumon antigen. Has prn oxycodone  for chest wall pain from pneumonia. Pt is tolerating po diet. No indication for IV opiates at this time.

## 2024-02-25 NOTE — Assessment & Plan Note (Signed)
 02/25/2024  Pt drinking fluids wells. Can stop IVF. Avoid nephrotoxic agents.

## 2024-02-25 NOTE — Assessment & Plan Note (Addendum)
 02/25/2024 admitting Scr of 1.53. with IVF hydration overnight, Scr down to 0.98. pt with hx of solitary Left Kidney. Pt drinking fluids wells. Can stop IVF. Avoid nephrotoxic agents.

## 2024-02-25 NOTE — Progress Notes (Addendum)
 " PROGRESS NOTE    Connie Wells  FMW:[id number] DOB: 1985/10/28 DOA: 02/24/2024 PCP: Patient, No Pcp Per  Subjective: Pt seen and examined. Pt c/o bitterly of many things. Despite having multifocal pneumonia, her biggest complaints is that her breakfast is cold. Remains on IV rocephin . On RA.    Hospital Course: CC: c/o near syncopal episode, also c/o back pain and chest pain  HPI:  Connie Wells is a 38 y.o. female with medical history significant of metastatic breast cancer on immunotherapy who presents emergency department with shortness of breath and fever.  Patient had several days of cough worsening shortness of breath.  Came to the emergency department on 12/29 however left due to long wait times.  Patient returned today due to persistent symptoms.  On arrival she was afebrile and hemodynamically stable.  Labs were obtained which showed creatinine 1.5 baseline around 1, potassium 3.4, WBC 32, hemoglobin 11.2, blood cultures pending.  Lactic acid 1.3.  Patient underwent chest x-ray which showed concern for pneumonia.  CTA pulm embolism study demonstrated similar findings.  Patient was started on ceftriaxone  and azithromycin  admitted for further workup.   Significant Events: Admitted 02/24/2024 for multifocal pneumonia 02-25-2024 tested positive for RSV  Admission Labs: WBC 32, HgB 11.2, plt 284 Lactic acid 1.3 Na 132, K 3.4, CO2 of 22, BUN 16, scr 1.53, glu 136 T. Prot 8.2, alb 3.4, AST 22, ALT 13, alk phos 81, t. Bili 0.7  Admission Imaging Studies: CTPA No pulmonary embolism. 2. Multifocal opacity in the left lower lobe and the right middle and lower lobes, likely infection. PCXR Stable left basilar consolidation suspicious for pneumonia. 2. Developing right basilar infiltrate. 3. Left Port-A-Cath with tip projecting over the mid SVC.  Significant Labs: RSV POSITIVE  Significant Imaging Studies:   Antibiotic Therapy: Anti-infectives (From admission, onward)     Start     Dose/Rate Route Frequency Ordered Stop   02/25/24 1900  azithromycin  (ZITHROMAX ) tablet 500 mg        500 mg Oral Daily 02/24/24 2212     02/25/24 1800  cefTRIAXone  (ROCEPHIN ) 2 g in sodium chloride  0.9 % 100 mL IVPB        2 g 200 mL/hr over 30 Minutes Intravenous Every 24 hours 02/24/24 2212     02/24/24 1745  cefTRIAXone  (ROCEPHIN ) 2 g in sodium chloride  0.9 % 100 mL IVPB        2 g 200 mL/hr over 30 Minutes Intravenous Once 02/24/24 1736 02/24/24 1821   02/24/24 1745  azithromycin  (ZITHROMAX ) 500 mg in sodium chloride  0.9 % 250 mL IVPB        500 mg 250 mL/hr over 60 Minutes Intravenous  Once 02/24/24 1736 02/24/24 2003       Procedures:   Consultants: Heme/onc notified of pt's admission.    Assessment and Plan: * Multifocal pneumonia 02/25/2024 continue rocephin  IV 2 gram daily and po zithromax . Check legionella and strep pneumon antigen. Has prn oxycodone  for chest wall pain from pneumonia. Pt is tolerating po diet. No indication for IV opiates at this time.   RSV (respiratory syncytial virus pneumonia) 02/25/2024 *update. Pt tested positive for RSV. Placed in droplet isolation.   Sepsis due to pneumonia (HCC) 02/25/2024 sepsis with acute organ dysfunction(AKI) due to pneumonia present on admission. Fever of 103. HR 120, WBC 32K.   AKI (acute kidney injury) 02/25/2024 admitting Scr of 1.53. with IVF hydration overnight, Scr down to 0.98. pt with hx of solitary Left Kidney.  Pt drinking fluids wells. Can stop IVF. Avoid nephrotoxic agents.   Breast cancer metastasized to multiple sites [facility]) 02/25/2024 last chemo on 12-22. She had: Dextrose  10 mL/hr diphenhydrAMINE  HCl 25 mg Acetaminophen  650 mg Palonosetron  HCl 0.25 mg dexAMETHasone  Sodium Phosphate  10 mg fosaprepitant  (EMEND) 150 mg in sodium chloride ... 150 mg fam-trastuzumab deruxtecan-nxki  (ENHERTU ) 260 mg... 260 mg  Have added her primary oncologist to pt's care  team.  Hypokalemia 02/25/2024 admitting K of 3.4. today K of 3.6. will give extra dose of Kcl 40 meq x 1 today.   SOLITARY KIDNEY - only has Left side kidney 02/25/2024  Pt drinking fluids wells. Can stop IVF. Avoid nephrotoxic agents.     DVT prophylaxis: enoxaparin  (LOVENOX ) injection 40 mg Start: 02/25/24 1000 SCDs Start: 02/24/24 2227    Code Status: Full Code Family Communication: pt is decisional Disposition Plan: return home Reason for continuing need for hospitalization: remains on IV ABX.  Objective: Vitals:   02/25/24 0315 02/25/24 0633 02/25/24 0649 02/25/24 1051  BP:  101/65  107/73  Pulse: (!) 104 (!) 104  99  Resp: (!) 25 (!) 24  18  Temp:   98.9 F (37.2 C) 98.8 F (37.1 C)  TempSrc:   Oral   SpO2: 94% 96%  97%    Intake/Output Summary (Last 24 hours) at 02/25/2024 1520 Last data filed at 02/24/2024 2012 Gross per 24 hour  Intake 1339.23 ml  Output --  Net 1339.23 ml   There were no vitals filed for this visit.  Examination:  Physical Exam Vitals and nursing note reviewed.  Constitutional:      General: She is not in acute distress.    Appearance: She is normal weight. She is not toxic-appearing or diaphoretic.  HENT:     Head: Normocephalic and atraumatic.  Eyes:     General: No scleral icterus. Cardiovascular:     Rate and Rhythm: Normal rate and regular rhythm.  Pulmonary:     Effort: Pulmonary effort is normal.     Breath sounds: Rales present.     Comments: Bibasilar rales Abdominal:     General: Abdomen is flat. Bowel sounds are normal. There is no distension.  Musculoskeletal:     Comments: Left ant chest wall port-a-cath  Skin:    General: Skin is warm and dry.     Capillary Refill: Capillary refill takes less than 2 seconds.  Neurological:     Mental Status: She is alert and oriented to person, place, and time.     Data Reviewed: I have personally reviewed following labs and imaging studies  CBC: Recent Labs  Lab  02/23/24 1917 02/24/24 1732 02/25/24 0500  WBC 20.7* 32.0* 31.4*  NEUTROABS  --  29.1*  --   HGB 12.2 11.2* 9.1*  HCT 35.8* 32.5* 26.8*  MCV 93.5 92.6 96.1  PLT 298 284 260   Basic Metabolic Panel: Recent Labs  Lab 02/23/24 1917 02/24/24 1732 02/25/24 0500  NA 133* 132* 135  K 3.4* 3.4* 3.6  CL 97* 97* 103  CO2 24 22 20*  GLUCOSE 119* 136* 106*  BUN 12 16 10   CREATININE 1.13* 1.53* 0.98  CALCIUM  9.0 8.9 7.8*   GFR: Estimated Creatinine Clearance: 70.7 mL/min (by C-G formula based on SCr of 0.98 mg/dL). Liver Function Tests: Recent Labs  Lab 02/24/24 1732  AST 22  ALT 13  ALKPHOS 81  BILITOT 0.7  PROT 8.2*  ALBUMIN  3.4*   Coagulation Profile: Recent Labs  Lab 02/25/24 0500  INR 1.2   Sepsis Labs: Recent Labs  Lab 02/24/24 1747  LATICACIDVEN 1.3    Recent Results (from the past 240 hours)  Resp panel by RT-PCR (RSV, Flu A&B, Covid) Anterior Nasal Swab     Status: Abnormal   Collection Time: 02/23/24  6:23 PM   Specimen: Anterior Nasal Swab  Result Value Ref Range Status   SARS Coronavirus 2 by RT PCR NEGATIVE NEGATIVE Final    Comment: (NOTE) SARS-CoV-2 target nucleic acids are NOT DETECTED.  The SARS-CoV-2 RNA is generally detectable in upper respiratory specimens during the acute phase of infection. The lowest concentration of SARS-CoV-2 viral copies this assay can detect is 138 copies/mL. A negative result does not preclude SARS-Cov-2 infection and should not be used as the sole basis for treatment or other patient management decisions. A negative result may occur with  improper specimen collection/handling, submission of specimen other than nasopharyngeal swab, presence of viral mutation(s) within the areas targeted by this assay, and inadequate number of viral copies(<138 copies/mL). A negative result must be combined with clinical observations, patient history, and epidemiological information. The expected result is Negative.  Fact Sheet  for Patients:  bloggercourse.com  Fact Sheet for Healthcare Providers:  seriousbroker.it  This test is no t yet approved or cleared by the [place]  FDA and  has been authorized for detection and/or diagnosis of SARS-CoV-2 by FDA under an Emergency Use Authorization (EUA). This EUA will remain  in effect (meaning this test can be used) for the duration of the COVID-19 declaration under Section 564(b)(1) of the Act, 21 U.S.C.section 360bbb-3(b)(1), unless the authorization is terminated  or revoked sooner.       Influenza A by PCR NEGATIVE NEGATIVE Final   Influenza B by PCR NEGATIVE NEGATIVE Final    Comment: (NOTE) The Xpert Xpress SARS-CoV-2/FLU/RSV plus assay is intended as an aid in the diagnosis of influenza from Nasopharyngeal swab specimens and should not be used as a sole basis for treatment. Nasal washings and aspirates are unacceptable for Xpert Xpress SARS-CoV-2/FLU/RSV testing.  Fact Sheet for Patients: bloggercourse.com  Fact Sheet for Healthcare Providers: seriousbroker.it  This test is not yet approved or cleared by the [place]  FDA and has been authorized for detection and/or diagnosis of SARS-CoV-2 by FDA under an Emergency Use Authorization (EUA). This EUA will remain in effect (meaning this test can be used) for the duration of the COVID-19 declaration under Section 564(b)(1) of the Act, 21 U.S.C. section 360bbb-3(b)(1), unless the authorization is terminated or revoked.     Resp Syncytial Virus by PCR POSITIVE (A) NEGATIVE Final    Comment: (NOTE) Fact Sheet for Patients: bloggercourse.com  Fact Sheet for Healthcare Providers: seriousbroker.it  This test is not yet approved or cleared by the [place]  FDA and has been authorized for detection and/or diagnosis of SARS-CoV-2 by FDA under an  Emergency Use Authorization (EUA). This EUA will remain in effect (meaning this test can be used) for the duration of the COVID-19 declaration under Section 564(b)(1) of the Act, 21 U.S.C. section 360bbb-3(b)(1), unless the authorization is terminated or revoked.  Performed at [facility], [address], [place], [place] [address]   Culture, blood (routine x 2)     Status: None (Preliminary result)   Collection Time: 02/24/24  5:32 PM   Specimen: Porta Cath; Blood  Result Value Ref Range Status   Specimen Description   Final    PORTA CATH Performed at [facility], [address] [place], [place] [address]    Special Requests   Final    BOTTLES DRAWN AEROBIC AND ANAEROBIC Blood Culture adequate volume Performed at [facility], [address] [place], [place] [address]    Culture   Final    NO GROWTH < 12 HOURS Performed at [facility], [address], [place], [place] [address]    Report Status PENDING  Incomplete     Radiology Studies: DG Chest Portable 1 View Result Date: 02/24/2024 EXAM: 1 VIEW(S) XRAY OF THE CHEST 02/24/2024 05:51:00 PM COMPARISON: 02/23/2024 CLINICAL HISTORY: dyspnea FINDINGS: LINES, TUBES AND DEVICES: Left Port-A-Cath in place with tip over mid SVC. LUNGS AND PLEURA: Stable left basilar consolidation suspicious for pneumonia. Developing right basilar infiltrate. No pleural effusion. No pneumothorax. HEART AND MEDIASTINUM: No acute abnormality of the cardiac and mediastinal silhouettes. BONES AND SOFT TISSUES: No acute osseous abnormality. IMPRESSION: 1. Stable left basilar consolidation suspicious for pneumonia. 2. Developing right basilar infiltrate. 3. Left Port-A-Cath with tip projecting over the mid SVC. Electronically signed by: Dorethia Molt MD 02/24/2024 07:16 PM EST RP Workstation: [id number]   CT Angio Chest PE W and/or Wo Contrast Result Date: 02/24/2024 EXAM: CTA CHEST 02/24/2024 12:10:01 AM  TECHNIQUE: CTA of the chest was performed without and with the administration of 75 mL of iohexol  (OMNIPAQUE ) 350 MG/ML injection. Multiplanar reformatted images are provided for review. MIP images are provided for review. Automated exposure control, iterative reconstruction, and/or weight based adjustment of the mA/kV was utilized to reduce the radiation dose to as low as reasonably achievable. COMPARISON: 11/17/2023 CLINICAL HISTORY: Pulmonary embolism (PE) suspected, low to intermediate prob, positive D-dimer. FINDINGS: PULMONARY ARTERIES: Pulmonary arteries are adequately opacified for evaluation. No acute pulmonary embolus. Main pulmonary artery is normal in caliber. MEDIASTINUM: The heart and pericardium demonstrate no acute abnormality. There is no acute abnormality of the thoracic aorta. LYMPH NODES: No mediastinal, hilar or axillary lymphadenopathy. LUNGS AND PLEURA: Multifocal opacity in the left lower lobe and the right middle and lower lobes, likely infection. No evidence of pleural effusion or pneumothorax. UPPER ABDOMEN: Limited images of the upper abdomen are unremarkable. SOFT TISSUES AND BONES: No acute bone or soft tissue abnormality. IMPRESSION: 1. No pulmonary embolism. 2. Multifocal opacity in the left lower lobe and the right middle and lower lobes, likely infection. Electronically signed by: Franky Stanford MD 02/24/2024 02:21 AM EST RP Workstation: [id number]   DG Chest 2 View Result Date: 02/23/2024 CLINICAL DATA:  Cough and shortness of breath. Active immunotherapy. EXAM: DG CHEST 2V COMPARISON:  03/21/2023 FINDINGS: Left chest port with tip in the SVC. Patchy left lower lobe opacity suspicious for pneumonia. The heart is normal in size. No pulmonary edema. No pleural fluid or pneumothorax. On limited assessment, no acute osseous findings. IMPRESSION: Patchy left lower lobe opacity suspicious for pneumonia. Electronically Signed   By: Andrea Gasman M.D.   On: 02/23/2024 20:26     Scheduled Meds:  azithromycin   500 mg Oral Daily   enoxaparin  (LOVENOX ) injection  40 mg Subcutaneous Q24H   Continuous Infusions:  cefTRIAXone  (ROCEPHIN )  IV       LOS: 1 day   Time spent: 55 minutes  Camellia Door, DO  [facility]  02/25/2024, 3:20 PM  "

## 2024-02-25 NOTE — Telephone Encounter (Signed)
 Pt called to let us  know she is admitted to [facility]. She left AMA 12/30 then returned in the night via ambulance for increased SHOB. CT revealed pna, and swab revealed RSV. She is currently on IV rocephin .  She is due for CT CAP, but d/t her new dx of pna, Lindsey, NP thinks it best to defer CT CAP for a later date d/t how pna can obscure mets visualization. Order cancelled.

## 2024-02-25 NOTE — Subjective & Objective (Signed)
 Pt seen and examined. Pt c/o bitterly of many things. Despite having multifocal pneumonia, her biggest complaints is that her breakfast is cold. Remains on IV rocephin . On RA.

## 2024-02-25 NOTE — Assessment & Plan Note (Signed)
 02/25/2024 *update. Pt tested positive for RSV. Placed in droplet isolation.

## 2024-02-25 NOTE — Assessment & Plan Note (Addendum)
 02/25/2024 last chemo on 12-22. She had: Dextrose  10 mL/hr diphenhydrAMINE  HCl 25 mg Acetaminophen  650 mg Palonosetron  HCl 0.25 mg dexAMETHasone  Sodium Phosphate  10 mg fosaprepitant  (EMEND) 150 mg in sodium chloride ... 150 mg fam-trastuzumab deruxtecan-nxki  (ENHERTU ) 260 mg... 260 mg  Have added her primary oncologist to pt's care team.

## 2024-02-26 ENCOUNTER — Other Ambulatory Visit (HOSPITAL_COMMUNITY): Payer: Self-pay

## 2024-02-26 DIAGNOSIS — J188 Other pneumonia, unspecified organism: Secondary | ICD-10-CM

## 2024-02-26 MED ORDER — AZITHROMYCIN 500 MG PO TABS
500.0000 mg | ORAL_TABLET | Freq: Every day | ORAL | 0 refills | Status: DC
  Filled 2024-02-26: qty 5, 5d supply, fill #0

## 2024-02-26 MED ORDER — IBUPROFEN 200 MG PO TABS: 200.0000 mg | ORAL_TABLET | Freq: Four times a day (QID) | ORAL | 2 refills | Status: AC | PRN

## 2024-02-26 MED ORDER — FLUTICASONE PROPIONATE HFA 110 MCG/ACT IN AERO
2.0000 | INHALATION_SPRAY | Freq: Two times a day (BID) | RESPIRATORY_TRACT | 12 refills | Status: DC
  Filled 2024-02-26: qty 1, fill #0

## 2024-02-26 MED ORDER — ALBUTEROL SULFATE HFA 108 (90 BASE) MCG/ACT IN AERS
2.0000 | INHALATION_SPRAY | Freq: Four times a day (QID) | RESPIRATORY_TRACT | 2 refills | Status: DC | PRN
  Filled 2024-02-26: qty 18, 20d supply, fill #0

## 2024-02-26 MED ORDER — HEPARIN SOD (PORK) LOCK FLUSH 100 UNIT/ML IV SOLN
500.0000 [IU] | Freq: Once | INTRAVENOUS | Status: AC
  Administered 2024-02-26: 500 [IU] via INTRAVENOUS
  Filled 2024-02-26: qty 5

## 2024-02-26 MED ORDER — ALBUTEROL SULFATE HFA 108 (90 BASE) MCG/ACT IN AERS: 2.0000 | INHALATION_SPRAY | Freq: Four times a day (QID) | RESPIRATORY_TRACT | 2 refills | Status: AC | PRN

## 2024-02-26 MED ORDER — CEFUROXIME AXETIL 500 MG PO TABS: 500.0000 mg | ORAL_TABLET | Freq: Two times a day (BID) | ORAL | 0 refills | Status: AC

## 2024-02-26 MED ORDER — ENSURE PLUS HIGH PROTEIN PO LIQD: 237.0000 mL | Freq: Two times a day (BID) | ORAL | Status: DC

## 2024-02-26 MED ORDER — GUAIFENESIN-DM 100-10 MG/5ML PO SYRP
5.0000 mL | ORAL_SOLUTION | ORAL | 0 refills | Status: DC | PRN
  Filled 2024-02-26: qty 118, 4d supply, fill #0

## 2024-02-26 MED ORDER — FLUTICASONE PROPIONATE HFA 110 MCG/ACT IN AERO: 2.0000 | INHALATION_SPRAY | Freq: Two times a day (BID) | RESPIRATORY_TRACT | 12 refills | Status: AC

## 2024-02-26 MED ORDER — AZITHROMYCIN 500 MG PO TABS: 500.0000 mg | ORAL_TABLET | Freq: Every day | ORAL | 0 refills | Status: AC

## 2024-02-26 MED ORDER — IBUPROFEN 200 MG PO TABS
200.0000 mg | ORAL_TABLET | Freq: Four times a day (QID) | ORAL | 2 refills | Status: DC | PRN
  Filled 2024-02-26: qty 100, 25d supply, fill #0

## 2024-02-26 MED ORDER — GUAIFENESIN-DM 100-10 MG/5ML PO SYRP: 5.0000 mL | ORAL_SOLUTION | ORAL | 0 refills | Status: AC | PRN

## 2024-02-26 MED ORDER — CEFUROXIME AXETIL 500 MG PO TABS: 500.0000 mg | ORAL_TABLET | Freq: Two times a day (BID) | ORAL | 0 refills | Status: DC

## 2024-02-26 MED ORDER — CEFUROXIME AXETIL 500 MG PO TABS
500.0000 mg | ORAL_TABLET | Freq: Two times a day (BID) | ORAL | 0 refills | Status: DC
  Filled 2024-02-26: qty 10, 5d supply, fill #0

## 2024-02-26 NOTE — Discharge Summary (Signed)
 Physician Discharge Summary  Connie Wells FMW:[id number] DOB: 09/28/1985 DOA: 02/24/2024  PCP: Patient, No Pcp Per  Admit date: 02/24/2024 Discharge date: 02/26/2024 Discharging to: home   Patient quite rude and disagreeing with being discharged.    Discharge Diagnoses:   Principal Problem:   Multifocal pneumonia Active Problems:   Sepsis due to pneumonia (HCC)   RSV (respiratory syncytial virus pneumonia)   AKI (acute kidney injury)   Breast cancer metastasized to multiple sites (HCC)   SOLITARY KIDNEY - only has Left side kidney   Hypokalemia     Hospital Course:  Connie Wells is a 39 y.o. female with medical history significant of metastatic breast cancer on immunotherapy who presents emergency department with shortness of breath and fever.  Patient had several days of cough worsening shortness of breath.  Came to the emergency department on 12/29 however left due to long wait times.  Patient returned today due to persistent symptoms.  On arrival she was afebrile and hemodynamically stable.  Labs were obtained which showed creatinine 1.5 baseline around 1, potassium 3.4, WBC 32, hemoglobin 11.2, blood cultures pending.  Lactic acid 1.3.  Patient underwent chest x-ray which showed concern for pneumonia.  CTA pulm embolism study demonstrated similar findings.  Patient was started on ceftriaxone  and azithromycin  admitted for further workup.   Principal Problem:   Multifocal pneumonia- recent RSV- in an immune compromised patient  - no hypoxia at this time, she has a persistent cough - dc home with Ceftin (total 10 days), Azithromycin . Albuterol , steroid inhaler, cough medications  Active Problems:   Sepsis due to pneumonia   - see above    AKI (acute kidney injury) - has improved    Breast cancer metastasized to multiple sites  - has been receiving chemotherapy    SOLITARY KIDNEY - only has Left side kidney          Discharge  Instructions   Allergies as of 02/26/2024   No Known Allergies      Medication List     TAKE these medications    azithromycin  500 MG tablet Commonly known as: Zithromax  Take 1 tablet (500 mg total) by mouth daily for 5 days.   cefUROXime 500 MG tablet Commonly known as: CEFTIN Take 1 tablet (500 mg total) by mouth 2 (two) times daily with a meal for 5 days.   guaiFENesin -dextromethorphan 100-10 MG/5ML syrup Commonly known as: ROBITUSSIN DM Take 5 mLs by mouth every 4 (four) hours as needed for cough.   ibuprofen  200 MG tablet Commonly known as: Motrin  IB Take 1 tablet (200 mg total) by mouth every 6 (six) hours as needed.   ondansetron  8 MG tablet Commonly known as: Zofran  Take 1 tablet (8 mg total) by mouth every 8 (eight) hours as needed for nausea or vomiting. Start on the third day after chemotherapy.            The results of significant diagnostics from this hospitalization (including imaging, microbiology, ancillary and laboratory) are listed below for reference.    DG Chest Portable 1 View Result Date: 02/24/2024 EXAM: 1 VIEW(S) XRAY OF THE CHEST 02/24/2024 05:51:00 PM COMPARISON: 02/23/2024 CLINICAL HISTORY: dyspnea FINDINGS: LINES, TUBES AND DEVICES: Left Port-A-Cath in place with tip over mid SVC. LUNGS AND PLEURA: Stable left basilar consolidation suspicious for pneumonia. Developing right basilar infiltrate. No pleural effusion. No pneumothorax. HEART AND MEDIASTINUM: No acute abnormality of the cardiac and mediastinal silhouettes. BONES AND SOFT TISSUES: No acute osseous abnormality. IMPRESSION:  1. Stable left basilar consolidation suspicious for pneumonia. 2. Developing right basilar infiltrate. 3. Left Port-A-Cath with tip projecting over the mid SVC. Electronically signed by: Dorethia Molt MD 02/24/2024 07:16 PM EST RP Workstation: [id number]   CT Angio Chest PE W and/or Wo Contrast Result Date: 02/24/2024 EXAM: CTA CHEST 02/24/2024 12:10:01 AM  TECHNIQUE: CTA of the chest was performed without and with the administration of 75 mL of iohexol  (OMNIPAQUE ) 350 MG/ML injection. Multiplanar reformatted images are provided for review. MIP images are provided for review. Automated exposure control, iterative reconstruction, and/or weight based adjustment of the mA/kV was utilized to reduce the radiation dose to as low as reasonably achievable. COMPARISON: 11/17/2023 CLINICAL HISTORY: Pulmonary embolism (PE) suspected, low to intermediate prob, positive D-dimer. FINDINGS: PULMONARY ARTERIES: Pulmonary arteries are adequately opacified for evaluation. No acute pulmonary embolus. Main pulmonary artery is normal in caliber. MEDIASTINUM: The heart and pericardium demonstrate no acute abnormality. There is no acute abnormality of the thoracic aorta. LYMPH NODES: No mediastinal, hilar or axillary lymphadenopathy. LUNGS AND PLEURA: Multifocal opacity in the left lower lobe and the right middle and lower lobes, likely infection. No evidence of pleural effusion or pneumothorax. UPPER ABDOMEN: Limited images of the upper abdomen are unremarkable. SOFT TISSUES AND BONES: No acute bone or soft tissue abnormality. IMPRESSION: 1. No pulmonary embolism. 2. Multifocal opacity in the left lower lobe and the right middle and lower lobes, likely infection. Electronically signed by: Franky Stanford MD 02/24/2024 02:21 AM EST RP Workstation: [id number]   DG Chest 2 View Result Date: 02/23/2024 CLINICAL DATA:  Cough and shortness of breath. Active immunotherapy. EXAM: DG CHEST 2V COMPARISON:  03/21/2023 FINDINGS: Left chest port with tip in the SVC. Patchy left lower lobe opacity suspicious for pneumonia. The heart is normal in size. No pulmonary edema. No pleural fluid or pneumothorax. On limited assessment, no acute osseous findings. IMPRESSION: Patchy left lower lobe opacity suspicious for pneumonia. Electronically Signed   By: Andrea Gasman M.D.   On: 02/23/2024 20:26   MR  Brain W Wo Contrast Result Date: 01/28/2024 EXAM: MRI BRAIN WITH AND WITHOUT CONTRAST 01/28/2024 11:48:04 AM TECHNIQUE: Multiplanar multisequence MRI of the head/brain was performed with and without the administration of intravenous contrast. 6 mL (gadobutrol  (GADAVIST ) 1 MMOL/ML injection 6 mL GADOBUTROL  1 MMOL/ML IV SOLN). COMPARISON: None available. CLINICAL HISTORY: Headache. History of breast cancer. FINDINGS: LIMITATIONS/ARTIFACTS: The examination is intermittently up to moderately motion degraded. BRAIN AND VENTRICLES: There is no evidence of an acute infarct, intracranial hemorrhage, mass, midline shift, hydrocephalus, or extra-axial fluid collection. Cerebral volume is normal. A few punctate foci of T2 hyperintensity/nonspecific gliosis are noted in the bilateral cerebral white matter. A developmental venous anomaly is incidentally noted in the left occipital lobe. A tiny focus of enhancement in the left cerebellar hemisphere is also likely vascular. No abnormal brain parenchymal or meningeal enhancement suggestive of metastatic disease is identified. There is minimal cerebellar tonsillar ectopia. Major intracranial vascular flow voids are preserved. ORBITS: No acute abnormality. SINUSES: Moderate right maxillary and ethmoid sinus mucosal thickening. No significant mastoid fluid. BONES AND SOFT TISSUES: Normal bone marrow signal and enhancement. No acute soft tissue abnormality. IMPRESSION: 1. No acute intracranial abnormality or evidence of metastatic disease. Electronically signed by: Dasie Hamburg MD 01/28/2024 12:18 PM EST RP Workstation: [id number]   Labs:   Basic Metabolic Panel: Recent Labs  Lab 02/23/24 1917 02/24/24 1732 02/25/24 0500  NA 133* 132* 135  K 3.4* 3.4* 3.6  CL 97* 97* 103  CO2 24 22 20*  GLUCOSE 119* 136* 106*  BUN 12 16 10   CREATININE 1.13* 1.53* 0.98  CALCIUM  9.0 8.9 7.8*     CBC: Recent Labs  Lab 02/23/24 1917 02/24/24 1732 02/25/24 0500  WBC 20.7* 32.0*  31.4*  NEUTROABS  --  29.1*  --   HGB 12.2 11.2* 9.1*  HCT 35.8* 32.5* 26.8*  MCV 93.5 92.6 96.1  PLT 298 284 260         SIGNED:   True Atlas, MD  [facility] 02/26/2024, 10:13 AM Time taking on discharge: 50 minutes

## 2024-02-26 NOTE — Plan of Care (Signed)

## 2024-02-26 NOTE — Plan of Care (Addendum)
 1040: Patient discharge orders placed, all medications and AVS went over wit pt. Questions answered, patient still refusing to be discharged home. [facility] notified.   1145: AC spoke to patient, went over AVS with patient, Port de-accessed, pt awaiting ride, ineligible for d/c lounge d/t droplet precaution atm. Will continue to assess. 1255: Patient walked out independently, ride awaiting for her downstairs.  Problem: Education: Goal: Knowledge of General Education information will improve Description: Including pain rating scale, medication(s)/side effects and non-pharmacologic comfort measures Outcome: Progressing   Problem: Health Behavior/Discharge Planning: Goal: Ability to manage health-related needs will improve Outcome: Progressing   Problem: Clinical Measurements: Goal: Ability to maintain clinical measurements within normal limits will improve Outcome: Progressing Goal: Will remain free from infection Outcome: Progressing Goal: Diagnostic test results will improve Outcome: Progressing Goal: Respiratory complications will improve Outcome: Progressing Goal: Cardiovascular complication will be avoided Outcome: Progressing   Problem: Activity: Goal: Risk for activity intolerance will decrease Outcome: Progressing   Problem: Nutrition: Goal: Adequate nutrition will be maintained Outcome: Progressing   Problem: Coping: Goal: Level of anxiety will decrease Outcome: Progressing   Problem: Elimination: Goal: Will not experience complications related to bowel motility Outcome: Progressing Goal: Will not experience complications related to urinary retention Outcome: Progressing   Problem: Pain Managment: Goal: General experience of comfort will improve and/or be controlled Outcome: Progressing   Problem: Safety: Goal: Ability to remain free from injury will improve Outcome: Progressing   Problem: Skin Integrity: Goal: Risk for impaired skin integrity will  decrease Outcome: Progressing

## 2024-02-29 LAB — CULTURE, BLOOD (ROUTINE X 2)
Culture: NO GROWTH
Special Requests: ADEQUATE

## 2024-03-01 LAB — CULTURE, BLOOD (ROUTINE X 2)
Culture: NO GROWTH
Special Requests: ADEQUATE

## 2024-03-02 ENCOUNTER — Inpatient Hospital Stay: Attending: Hematology and Oncology | Admitting: Licensed Clinical Social Worker

## 2024-03-02 NOTE — Progress Notes (Signed)
 [facility] CSW Progress Note  Clinical Child Psychotherapist contacted patient by phone to follow-up on need for community resources.    Interventions: Referred patient to community resources: [employer], [employer] grant, [employer]  Completed application for [employer] Will provide food bag from [employer] at appt on 1/12    Follow Up Plan:  Patient will contact CSW with any support or resource needs    Karletta Millay E Sherin Murdoch, LCSW Clinical Social Worker [facility]    Patient is participating in a Managed Medicaid Plan:  Yes

## 2024-03-03 ENCOUNTER — Inpatient Hospital Stay: Admitting: Licensed Clinical Social Worker

## 2024-03-03 NOTE — Progress Notes (Signed)
 [facility] CSW Progress Note  Clinical Social Worker contacted patient by phone to follow-up on disability concerns. Informed patient that [facility] had tried to contact her in 2024 and was unable to complete the application with her. A new referral is required. CSW will send referral once pt signs release form at her appt on 03/08/2024. Pt voiced understanding.     Follow Up Plan:  Patient will sign release form for [facility] on 03/08/24    Foxx Klarich E Tanija Germani, LCSW Clinical Social Worker [facility]

## 2024-03-05 ENCOUNTER — Encounter: Payer: Self-pay | Admitting: Hematology and Oncology

## 2024-03-05 MED FILL — Fosaprepitant Dimeglumine For IV Infusion 150 MG (Base Eq): INTRAVENOUS | Qty: 5 | Status: AC

## 2024-03-05 NOTE — Progress Notes (Signed)
Called pt to introduce myself as her Dance movement psychotherapist and to discuss the [employer].  I left a msg requesting she return my call if she's interested in applying for the grant.

## 2024-03-08 ENCOUNTER — Inpatient Hospital Stay

## 2024-03-08 ENCOUNTER — Inpatient Hospital Stay: Admitting: Adult Health

## 2024-03-08 ENCOUNTER — Telehealth: Payer: Self-pay

## 2024-03-08 NOTE — Telephone Encounter (Signed)
 Attempted to contact patient regarding missed appointment with LC. No answer. Voicemail left requesting patient to return call to reschedule future appointments.

## 2024-03-12 ENCOUNTER — Telehealth: Payer: Self-pay | Admitting: Hematology and Oncology

## 2024-03-12 NOTE — Telephone Encounter (Signed)
 Left vm for pt to call back and rescheduled missed appt

## 2024-03-15 ENCOUNTER — Encounter: Payer: Self-pay | Admitting: Licensed Clinical Social Worker

## 2024-03-15 NOTE — Progress Notes (Signed)
 [facility] CSW Progress Note   Patient was approved for & received grant from [employer]    Tyasia Packard E Vail Basista, LCSW Clinical Social Worker [employer]

## 2024-04-22 ENCOUNTER — Ambulatory Visit: Admitting: Neurology

## 2024-05-14 ENCOUNTER — Ambulatory Visit: Admitting: Neurology
